# Patient Record
Sex: Male | Born: 1935 | Race: White | Hispanic: No | Marital: Married | Smoking: Former smoker
Health system: Southern US, Community
[De-identification: ages and names within clinical notes are randomized; demographics above are authoritative.]

## PROBLEM LIST (undated history)

## (undated) DIAGNOSIS — Q2730 Arteriovenous malformation, site unspecified: Secondary | ICD-10-CM

## (undated) DIAGNOSIS — I2510 Atherosclerotic heart disease of native coronary artery without angina pectoris: Secondary | ICD-10-CM

## (undated) DIAGNOSIS — I359 Nonrheumatic aortic valve disorder, unspecified: Secondary | ICD-10-CM

## (undated) DIAGNOSIS — G309 Alzheimer's disease, unspecified: Secondary | ICD-10-CM

## (undated) DIAGNOSIS — E785 Hyperlipidemia, unspecified: Secondary | ICD-10-CM

## (undated) DIAGNOSIS — D649 Anemia, unspecified: Secondary | ICD-10-CM

## (undated) DIAGNOSIS — M069 Rheumatoid arthritis, unspecified: Secondary | ICD-10-CM

## (undated) DIAGNOSIS — F0280 Dementia in other diseases classified elsewhere without behavioral disturbance: Secondary | ICD-10-CM

## (undated) DIAGNOSIS — I82409 Acute embolism and thrombosis of unspecified deep veins of unspecified lower extremity: Secondary | ICD-10-CM

## (undated) HISTORY — PX: APPENDECTOMY: SHX54

## (undated) HISTORY — PX: TONSILLECTOMY: SUR1361

## (undated) HISTORY — PX: TRIGGER FINGER RELEASE: SHX641

## (undated) HISTORY — DX: Nonrheumatic aortic valve disorder, unspecified: I35.9

## (undated) HISTORY — DX: Anemia, unspecified: D64.9

## (undated) HISTORY — PX: LEG SURGERY: SHX1003

## (undated) HISTORY — PX: EYE SURGERY: SHX253

## (undated) HISTORY — DX: Hyperlipidemia, unspecified: E78.5

## (undated) HISTORY — DX: Acute embolism and thrombosis of unspecified deep veins of unspecified lower extremity: I82.409

---

## 2007-04-06 DIAGNOSIS — I2510 Atherosclerotic heart disease of native coronary artery without angina pectoris: Secondary | ICD-10-CM

## 2007-04-06 DIAGNOSIS — I359 Nonrheumatic aortic valve disorder, unspecified: Secondary | ICD-10-CM

## 2007-04-06 HISTORY — PX: AORTIC VALVE REPLACEMENT: SHX41

## 2007-04-06 HISTORY — PX: CARDIAC VALVE REPLACEMENT: SHX585

## 2007-04-06 HISTORY — PX: CORONARY ARTERY BYPASS GRAFT: SHX141

## 2007-04-06 HISTORY — DX: Atherosclerotic heart disease of native coronary artery without angina pectoris: I25.10

## 2007-04-06 HISTORY — DX: Nonrheumatic aortic valve disorder, unspecified: I35.9

## 2010-09-06 ENCOUNTER — Emergency Department (HOSPITAL_COMMUNITY)
Admission: EM | Admit: 2010-09-06 | Discharge: 2010-09-07 | Disposition: A | Discharge status: Home / Self Care | Payer: Medicare Other | Attending: Emergency Medicine | Admitting: Emergency Medicine

## 2010-09-06 ENCOUNTER — Emergency Department (HOSPITAL_COMMUNITY): Payer: Medicare Other

## 2010-09-06 DIAGNOSIS — I10 Essential (primary) hypertension: Secondary | ICD-10-CM | POA: Insufficient documentation

## 2010-09-06 DIAGNOSIS — I252 Old myocardial infarction: Secondary | ICD-10-CM | POA: Insufficient documentation

## 2010-09-06 DIAGNOSIS — Z7902 Long term (current) use of antithrombotics/antiplatelets: Secondary | ICD-10-CM | POA: Insufficient documentation

## 2010-09-06 DIAGNOSIS — Z7982 Long term (current) use of aspirin: Secondary | ICD-10-CM | POA: Insufficient documentation

## 2010-09-06 DIAGNOSIS — Z951 Presence of aortocoronary bypass graft: Secondary | ICD-10-CM | POA: Insufficient documentation

## 2010-09-06 DIAGNOSIS — I2510 Atherosclerotic heart disease of native coronary artery without angina pectoris: Secondary | ICD-10-CM | POA: Insufficient documentation

## 2010-09-06 DIAGNOSIS — J189 Pneumonia, unspecified organism: Secondary | ICD-10-CM | POA: Insufficient documentation

## 2010-09-06 DIAGNOSIS — R509 Fever, unspecified: Secondary | ICD-10-CM | POA: Insufficient documentation

## 2010-09-07 LAB — CBC
HCT: 39.3 % (ref 39.0–52.0)
Hemoglobin: 13.2 g/dL (ref 13.0–17.0)
MCH: 31.1 pg (ref 26.0–34.0)
MCHC: 33.6 g/dL (ref 30.0–36.0)
RDW: 13.6 % (ref 11.5–15.5)

## 2010-09-07 LAB — DIFFERENTIAL
Eosinophils Relative: 3 % (ref 0–5)
Lymphocytes Relative: 3 % — ABNORMAL LOW (ref 12–46)
Monocytes Absolute: 0.4 10*3/uL (ref 0.1–1.0)
Monocytes Relative: 3 % (ref 3–12)
Neutro Abs: 10.5 10*3/uL — ABNORMAL HIGH (ref 1.7–7.7)

## 2010-09-07 LAB — BASIC METABOLIC PANEL
BUN: 31 mg/dL — ABNORMAL HIGH (ref 6–23)
CO2: 25 mEq/L (ref 19–32)
Calcium: 10 mg/dL (ref 8.4–10.5)
Creatinine, Ser: 1.68 mg/dL — ABNORMAL HIGH (ref 0.4–1.5)
Glucose, Bld: 138 mg/dL — ABNORMAL HIGH (ref 70–99)

## 2013-04-05 HISTORY — PX: COLONOSCOPY: SHX174

## 2014-04-05 HISTORY — PX: UPPER GASTROINTESTINAL ENDOSCOPY: SHX188

## 2015-04-06 DIAGNOSIS — I82409 Acute embolism and thrombosis of unspecified deep veins of unspecified lower extremity: Secondary | ICD-10-CM

## 2015-04-06 HISTORY — DX: Acute embolism and thrombosis of unspecified deep veins of unspecified lower extremity: I82.409

## 2015-06-23 ENCOUNTER — Telehealth (INDEPENDENT_AMBULATORY_CARE_PROVIDER_SITE_OTHER): Payer: Self-pay | Admitting: Internal Medicine

## 2015-06-23 NOTE — Telephone Encounter (Signed)
Called patient and he said he cannot come tomorrow.  He said leave appt as is for now

## 2015-06-23 NOTE — Telephone Encounter (Signed)
Once referral comes in give to Tammy and ask her to talk with me.  She can ask Dr. Karilyn Cota if he thinks this needs to be this week.  I will have to be tomorrow as Wednesday we have inspection 3-4:30 (time changed with Rinaldo Cloud) and Thursday already has 2.

## 2015-06-23 NOTE — Telephone Encounter (Signed)
Dr. Hart Robinsons from [place], [place] called saying he wants Dr. Karilyn Cota to see Mr. Cervenka this week if possible. Mr. Davoli isn't symptomatic but within a six month time frame his HNH(sp?) levels went from 12 to 8.1. We offered Mr. Fancher to see Terri tomorrow but Dr. Hart Robinsons only wants him to see Dr. Karilyn Cota. Please let them know if this will be ok. They're supposed to fax a referral to [place] today.  Dr. Hart Robinsons' ph# [phone]  / Fax# [phone] Thank you.

## 2015-06-23 NOTE — Telephone Encounter (Signed)
Noted  

## 2015-06-24 NOTE — Telephone Encounter (Signed)
Per Dr.Rehman. Call office and have them to send the lab work. Per Dr.Rehman bring the patient in on Thursday.

## 2015-06-24 NOTE — Telephone Encounter (Signed)
Patient offered an appt for Thursday but he declined due to Rheumatology appt

## 2015-06-24 NOTE — Telephone Encounter (Signed)
Forwarded to [facility] to schedule OV, trying to obtain labs and notes

## 2015-06-24 NOTE — Telephone Encounter (Signed)
Will try to schedule patient this Thursday. Please obtain patient's recent bloodwork.

## 2015-06-25 NOTE — Telephone Encounter (Signed)
Patient's daughter called the OV back yesterday . She states that her father will be here on Thursday ,March 23,2017.

## 2015-06-26 ENCOUNTER — Encounter (INDEPENDENT_AMBULATORY_CARE_PROVIDER_SITE_OTHER): Payer: Self-pay | Admitting: Internal Medicine

## 2015-06-26 ENCOUNTER — Other Ambulatory Visit (INDEPENDENT_AMBULATORY_CARE_PROVIDER_SITE_OTHER): Payer: Self-pay | Admitting: Internal Medicine

## 2015-06-26 ENCOUNTER — Encounter (INDEPENDENT_AMBULATORY_CARE_PROVIDER_SITE_OTHER): Payer: Self-pay | Admitting: *Deleted

## 2015-06-26 ENCOUNTER — Ambulatory Visit (INDEPENDENT_AMBULATORY_CARE_PROVIDER_SITE_OTHER): Payer: Medicare Other | Admitting: Internal Medicine

## 2015-06-26 VITALS — BP 108/64 | HR 71 | Temp 97.1°F | Resp 18 | Ht 67.0 in | Wt 183.1 lb

## 2015-06-26 DIAGNOSIS — D509 Iron deficiency anemia, unspecified: Secondary | ICD-10-CM

## 2015-06-26 DIAGNOSIS — R195 Other fecal abnormalities: Secondary | ICD-10-CM | POA: Diagnosis not present

## 2015-06-26 DIAGNOSIS — K219 Gastro-esophageal reflux disease without esophagitis: Secondary | ICD-10-CM | POA: Insufficient documentation

## 2015-06-26 DIAGNOSIS — I10 Essential (primary) hypertension: Secondary | ICD-10-CM | POA: Insufficient documentation

## 2015-06-26 DIAGNOSIS — Z8719 Personal history of other diseases of the digestive system: Secondary | ICD-10-CM | POA: Diagnosis not present

## 2015-06-26 DIAGNOSIS — Z8711 Personal history of peptic ulcer disease: Secondary | ICD-10-CM | POA: Insufficient documentation

## 2015-06-26 DIAGNOSIS — K922 Gastrointestinal hemorrhage, unspecified: Secondary | ICD-10-CM

## 2015-06-26 DIAGNOSIS — Z951 Presence of aortocoronary bypass graft: Secondary | ICD-10-CM | POA: Insufficient documentation

## 2015-06-26 DIAGNOSIS — Z952 Presence of prosthetic heart valve: Secondary | ICD-10-CM | POA: Insufficient documentation

## 2015-06-26 NOTE — Patient Instructions (Signed)
Esophagogastroduodenoscopy to be scheduled. Blood work to be done prior to seizure tomorrow. Hemoccults 1
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
## 2015-06-27 ENCOUNTER — Observation Stay (HOSPITAL_COMMUNITY)
Admission: RE | Admit: 2015-06-27 | Discharge: 2015-06-28 | Disposition: A | Discharge status: Home / Self Care | Payer: Medicare Other | Source: Ambulatory Visit | Attending: Family Medicine | Admitting: Family Medicine

## 2015-06-27 ENCOUNTER — Encounter (HOSPITAL_COMMUNITY): Payer: Self-pay | Admitting: *Deleted

## 2015-06-27 ENCOUNTER — Encounter (HOSPITAL_COMMUNITY)
Admission: RE | Disposition: A | Discharge status: Home / Self Care | Payer: Self-pay | Source: Ambulatory Visit | Attending: Family Medicine

## 2015-06-27 DIAGNOSIS — Z79899 Other long term (current) drug therapy: Secondary | ICD-10-CM | POA: Diagnosis not present

## 2015-06-27 DIAGNOSIS — K3189 Other diseases of stomach and duodenum: Secondary | ICD-10-CM

## 2015-06-27 DIAGNOSIS — Z952 Presence of prosthetic heart valve: Secondary | ICD-10-CM | POA: Insufficient documentation

## 2015-06-27 DIAGNOSIS — D509 Iron deficiency anemia, unspecified: Secondary | ICD-10-CM

## 2015-06-27 DIAGNOSIS — I2510 Atherosclerotic heart disease of native coronary artery without angina pectoris: Secondary | ICD-10-CM | POA: Diagnosis not present

## 2015-06-27 DIAGNOSIS — Z8711 Personal history of peptic ulcer disease: Secondary | ICD-10-CM | POA: Insufficient documentation

## 2015-06-27 DIAGNOSIS — E785 Hyperlipidemia, unspecified: Secondary | ICD-10-CM | POA: Diagnosis not present

## 2015-06-27 DIAGNOSIS — K5521 Angiodysplasia of colon with hemorrhage: Secondary | ICD-10-CM | POA: Insufficient documentation

## 2015-06-27 DIAGNOSIS — Z7982 Long term (current) use of aspirin: Secondary | ICD-10-CM | POA: Diagnosis not present

## 2015-06-27 DIAGNOSIS — K922 Gastrointestinal hemorrhage, unspecified: Secondary | ICD-10-CM

## 2015-06-27 DIAGNOSIS — K449 Diaphragmatic hernia without obstruction or gangrene: Secondary | ICD-10-CM

## 2015-06-27 DIAGNOSIS — M069 Rheumatoid arthritis, unspecified: Secondary | ICD-10-CM | POA: Diagnosis not present

## 2015-06-27 DIAGNOSIS — Z7902 Long term (current) use of antithrombotics/antiplatelets: Secondary | ICD-10-CM | POA: Diagnosis not present

## 2015-06-27 DIAGNOSIS — D500 Iron deficiency anemia secondary to blood loss (chronic): Principal | ICD-10-CM | POA: Insufficient documentation

## 2015-06-27 DIAGNOSIS — K219 Gastro-esophageal reflux disease without esophagitis: Secondary | ICD-10-CM | POA: Insufficient documentation

## 2015-06-27 DIAGNOSIS — Z87891 Personal history of nicotine dependence: Secondary | ICD-10-CM | POA: Diagnosis not present

## 2015-06-27 DIAGNOSIS — K31819 Angiodysplasia of stomach and duodenum without bleeding: Secondary | ICD-10-CM

## 2015-06-27 HISTORY — DX: Atherosclerotic heart disease of native coronary artery without angina pectoris: I25.10

## 2015-06-27 HISTORY — PX: ESOPHAGOGASTRODUODENOSCOPY: SHX5428

## 2015-06-27 LAB — ABO/RH: ABO/RH(D): A POS

## 2015-06-27 LAB — HEMOGLOBIN AND HEMATOCRIT, BLOOD
HCT: 22.5 % — ABNORMAL LOW (ref 39.0–52.0)
HEMOGLOBIN: 6.7 g/dL — AB (ref 13.0–17.0)

## 2015-06-27 LAB — IRON AND TIBC
Iron: 10 ug/dL — ABNORMAL LOW (ref 45–182)
Saturation Ratios: 2 % — ABNORMAL LOW (ref 17.9–39.5)
TIBC: 445 ug/dL (ref 250–450)
UIBC: 435 ug/dL

## 2015-06-27 LAB — PREPARE RBC (CROSSMATCH)

## 2015-06-27 LAB — FERRITIN: FERRITIN: 10 ng/mL — AB (ref 24–336)

## 2015-06-27 SURGERY — EGD (ESOPHAGOGASTRODUODENOSCOPY)
Anesthesia: Moderate Sedation

## 2015-06-27 MED ORDER — MEPERIDINE HCL 50 MG/ML IJ SOLN
INTRAMUSCULAR | Status: DC | PRN
Start: 2015-06-27 — End: 2015-06-27
  Administered 2015-06-27 (×2): 25 mg via INTRAVENOUS

## 2015-06-27 MED ORDER — ACETAMINOPHEN 325 MG PO TABS
650.0000 mg | ORAL_TABLET | Freq: Once | ORAL | Status: AC
  Administered 2015-06-27: 650 mg via ORAL
  Filled 2015-06-27: qty 2

## 2015-06-27 MED ORDER — MIDAZOLAM HCL 5 MG/5ML IJ SOLN
INTRAMUSCULAR | Status: DC | PRN
  Administered 2015-06-27 (×2): 1 mg via INTRAVENOUS

## 2015-06-27 MED ORDER — MIDAZOLAM HCL 5 MG/5ML IJ SOLN
INTRAMUSCULAR | Status: AC
Start: 2015-06-27 — End: 2015-06-28
  Filled 2015-06-27: qty 10

## 2015-06-27 MED ORDER — BUTAMBEN-TETRACAINE-BENZOCAINE 2-2-14 % EX AERO
INHALATION_SPRAY | CUTANEOUS | Status: DC | PRN
  Administered 2015-06-27: 2 via TOPICAL

## 2015-06-27 MED ORDER — DIPHENHYDRAMINE HCL 25 MG PO CAPS
25.0000 mg | ORAL_CAPSULE | Freq: Once | ORAL | Status: AC
  Administered 2015-06-27: 25 mg via ORAL
  Filled 2015-06-27: qty 1

## 2015-06-27 MED ORDER — MEPERIDINE HCL 50 MG/ML IJ SOLN
INTRAMUSCULAR | Status: AC
  Filled 2015-06-27: qty 1

## 2015-06-27 MED ORDER — STERILE WATER FOR IRRIGATION IR SOLN
Status: DC | PRN
  Administered 2015-06-27: 15:00:00

## 2015-06-27 MED ORDER — FUROSEMIDE 10 MG/ML IJ SOLN
20.0000 mg | Freq: Once | INTRAMUSCULAR | Status: AC
  Administered 2015-06-27: 20 mg via INTRAVENOUS
  Filled 2015-06-27: qty 2

## 2015-06-27 MED ORDER — SODIUM CHLORIDE 0.9 % IV SOLN
Freq: Once | INTRAVENOUS | Status: AC
  Administered 2015-06-27: 10 mL/h via INTRAVENOUS

## 2015-06-27 MED ORDER — SODIUM CHLORIDE 0.9 % IV SOLN
INTRAVENOUS | Status: DC
  Administered 2015-06-27: 1000 mL via INTRAVENOUS

## 2015-06-27 MED ORDER — PREDNISONE 10 MG PO TABS
5.0000 mg | ORAL_TABLET | Freq: Once | ORAL | Status: AC
  Administered 2015-06-27: 5 mg via ORAL
  Filled 2015-06-27: qty 1

## 2015-06-27 NOTE — Op Note (Signed)
[facility] Patient Name: Leon Freeman Procedure Date: 06/27/2015 2:49 PM MRN: [id number] Date of Birth: 06-04-1931 Attending MD: Lionel December , MD CSN: [id number] Age: 82 Admit Type: Outpatient Procedure:                Upper GI endoscopy Indications:              Iron deficiency anemia secondary to chronic blood                            loss Providers:                Lionel December, MD, Cynda Acres, RN, Birder Robson,                            Technician Referring MD:             Renaldo Harrison D.O Medicines:                Cetacaine spray, Meperidine 50 mg IV, Midazolam 2                            mg IV Complications:            No immediate complications. Estimated Blood Loss:     Estimated blood loss was minimal. Procedure:                Pre-Anesthesia Assessment:                           - Prior to the procedure, a History and Physical                            was performed, and patient medications and                            allergies were reviewed. The patient's tolerance of                            previous anesthesia was also reviewed. The risks                            and benefits of the procedure and the sedation                            options and risks were discussed with the patient.                            All questions were answered, and informed consent                            was obtained. Prior Anticoagulants: The patient                            last took aspirin 1 day and Plavix (clopidogrel) 1  day prior to the procedure. ASA Grade Assessment:                            II - A patient with mild systemic disease. After                            reviewing the risks and benefits, the patient was                            deemed in satisfactory condition to undergo the                            procedure.                           After obtaining informed consent, the endoscope was    passed under direct vision. Throughout the                            procedure, the patient's blood pressure, pulse, and                            oxygen saturations were monitored continuously. The                            [id number] ([id number]) scope was introduced through the                            mouth, and advanced to the second part of duodenum.                            The upper GI endoscopy was accomplished without                            difficulty. The patient tolerated the procedure                            well. Scope In: 3:05:56 PM Scope Out: 3:25:52 PM Total Procedure Duration: 0 hours 19 minutes 56 seconds  Findings:      The examined esophagus was normal.      A small hiatal hernia was present.      A healed ulcer was found in the gastric antrum. The scar tissue was       healthy in appearance.      The exam was otherwise without abnormality.      A single 4 mm angiodysplastic lesion with bleeding on contact was found       in the second portion of the duodenum. To stop active bleeding, one       hemostatic clip was successfully placed (MR conditional). There was no       bleeding at the end of the procedure. Impression:               - Normal esophagus.                           -  Small hiatal hernia.                           - Scar in the gastric antrum.                           - The examination was otherwise normal.                           - A single angiodysplastic lesion in the duodenum.                            Clip (MR conditional) was placed.                           - No specimens collected. Moderate Sedation:      Moderate (conscious) sedation was administered by the endoscopy nurse       and supervised by the endoscopist. The following parameters were       monitored: oxygen saturation, heart rate, blood pressure, CO2       capnography and response to care. Total physician intraservice time was       26 minutes. Recommendation:            - Admit the patient to hospital ward for possible                            discharge same day.                           - transfuse 2 units of PRBCs                           - Resume previous diet.                           - Continue present medications. Procedure Code(s):        --- Professional ---                           [phone], Esophagogastroduodenoscopy, flexible,                            transoral; with control of bleeding, any method                           [id number], Moderate sedation services provided by the                            same physician or other qualified health care                            professional performing the diagnostic or                            therapeutic service that the sedation supports,  requiring the presence of an independent trained                            observer to assist in the monitoring of the                            patient's level of consciousness and physiological                            status; initial 15 minutes of intraservice time,                            patient age 43 years or older                           [phone], Moderate sedation services; each additional                            15 minutes intraservice time Diagnosis Code(s):        --- Professional ---                           K44.9, Diaphragmatic hernia without obstruction or                            gangrene                           K31.89, Other diseases of stomach and duodenum                           K31.819, Angiodysplasia of stomach and duodenum                            without bleeding                           D50.0, Iron deficiency anemia secondary to blood                            loss (chronic) CPT copyright 2016 American Medical Association. All rights reserved. The codes documented in this report are preliminary and upon coder review may  be revised to meet current compliance requirements. Lionel December, MD Lionel December, MD 06/27/2015 4:12:10 PM This report has been signed electronically. Number of Addenda: 0

## 2015-06-27 NOTE — Progress Notes (Signed)
Patient admitted to observation for blood transfusion to room 305.  Report called to Johnsie Cancel, RN.  Patient transported to Room 305 via wheelchair by Edrick Kins, RN

## 2015-06-27 NOTE — Progress Notes (Signed)
Patient underwent EGD and noted to have angiodysplasia at angle of duodenum with spontaneous bleeding during examination. The clip placed. Hemoglobin is 6.7. Hemoglobin was 8.1 on 06/19/2015 Patient will be admitted as an observation to receive 2 units of PRBCs.

## 2015-06-27 NOTE — H&P (Signed)
Leon Freeman is an 82 y.o. male.   Chief Complaint: Patient is here for esophagogastroduodenoscopy. HPI: Patient is 82 year old Caucasian male who was recently found to have hemoglobin of 8.1 g on routine testing stone is quite positive. There is no history of melena or rectal bleeding or hematemesis. Patient has history of gastric ulcer 3 years ago secondary to NSAID therapy. He is on low-dose aspirin and Plavix which are non-hold. He had normal colonoscopy about 2 years ago. He is undergoing diagnostic EGD. If EGD is negative proceed with small bowel given capsule study.  Past Medical History  Diagnosis Date  . Anemia   . Coronary artery disease     Past Surgical History  Procedure Laterality Date  . Cardiac valve surgery  2009  . Trigger finger release Left   . Leg surgery    . Colonoscopy  2015    Dr.Spainhour  . Upper gastrointestinal endoscopy  2016    Dr.Spainhour  . Appendectomy    . Eye surgery Left   . Coronary artery bypass graft      Family History  Problem Relation Age of Onset  . Dementia Mother   . Heart disease Father   . Healthy Daughter   . Bipolar disorder Son    Social History:  reports that he quit smoking about 40 years ago. He has never used smokeless tobacco. He reports that he does not drink alcohol. His drug history is not on file.  Allergies:  Allergies  Allergen Reactions  . Penicillins Itching  . Plaquenil [Hydroxychloroquine Sulfate]     Medications Prior to Admission  Medication Sig Dispense Refill  . clopidogrel (PLAVIX) 75 MG tablet Take 75 mg by mouth daily.    . metoprolol succinate (TOPROL-XL) 50 MG 24 hr tablet Take 50 mg by mouth daily. Take with or immediately following a meal.    . aspirin EC 81 MG tablet Take 81 mg by mouth daily.    Marland Kitchen atorvastatin (LIPITOR) 80 MG tablet Take 80 mg by mouth daily.    . calcium gluconate 500 MG tablet Take 1 tablet by mouth daily.    . Cholecalciferol (VITAMIN D) 2000 units CAPS Take by mouth  daily.    . clindamycin (CLEOCIN) 300 MG capsule Take 300 mg by mouth. Patient will takes 2 by mouth prior to dental treatment.    . Glucosamine-Chondroit-Vit C-Mn (GLUCOSAMINE 1500 COMPLEX) CAPS Take by mouth daily.    . Lactobacillus (BIOTINEX PO) Take 10,000 mcg by mouth daily.    Marland Kitchen METRONIDAZOLE, TOPICAL, (METROLOTION) 0.75 % LOTN Apply topically daily.    Marland Kitchen omeprazole (PRILOSEC) 40 MG capsule Take 40 mg by mouth daily.    . predniSONE (DELTASONE) 5 MG tablet Take 5 mg by mouth daily with breakfast.    . vitamin B-12 (CYANOCOBALAMIN) 1000 MCG tablet Take 1,000 mcg by mouth daily.    . vitamin C (ASCORBIC ACID) 500 MG tablet Take 500 mg by mouth daily.    Marland Kitchen VITAMINS-LIPOTROPICS PO Take by mouth 2 (two) times daily.    . Zinc 25 MG TABS Take by mouth daily.      No results found for this or any previous visit (from the past 48 hour(s)). No results found.  ROS  Blood pressure 115/77, pulse 99, temperature 98 F (36.7 C), temperature source Oral, resp. rate 21, height 5\' 7"  (1.702 m), weight 180 lb (81.647 kg), SpO2 92 %. Physical Exam  Constitutional: He appears well-developed and well-nourished.  HENT:  Mouth/Throat: Oropharynx  is clear and moist.  Eyes: Conjunctivae are normal. No scleral icterus.  Neck: No thyromegaly present.  Cardiovascular:  Regular rhythm normal S1 and loud S2. Grade 3/6 systolic ejection murmur heard all over the precordium but best heard at left sternal border.  GI: Soft. He exhibits no distension and no mass. There is no tenderness.  Musculoskeletal: He exhibits no edema.  Lymphadenopathy:    He has no cervical adenopathy.  Neurological: He is alert.  Skin: Skin is warm and dry.     Assessment/Plan Microcytic anemia and heme positive stool. Iron studies are pending. Diagnostic EGD to be followed by given capsule study if EGD is normal.  Malissa Hippo, MD 06/27/2015, 2:53 PM

## 2015-06-28 DIAGNOSIS — R0902 Hypoxemia: Secondary | ICD-10-CM | POA: Diagnosis present

## 2015-06-28 DIAGNOSIS — Z8249 Family history of ischemic heart disease and other diseases of the circulatory system: Secondary | ICD-10-CM

## 2015-06-28 DIAGNOSIS — I248 Other forms of acute ischemic heart disease: Secondary | ICD-10-CM | POA: Diagnosis present

## 2015-06-28 DIAGNOSIS — D62 Acute posthemorrhagic anemia: Secondary | ICD-10-CM | POA: Diagnosis present

## 2015-06-28 DIAGNOSIS — K31819 Angiodysplasia of stomach and duodenum without bleeding: Secondary | ICD-10-CM | POA: Diagnosis present

## 2015-06-28 DIAGNOSIS — I82491 Acute embolism and thrombosis of other specified deep vein of right lower extremity: Secondary | ICD-10-CM | POA: Diagnosis present

## 2015-06-28 DIAGNOSIS — Z7982 Long term (current) use of aspirin: Secondary | ICD-10-CM

## 2015-06-28 DIAGNOSIS — R55 Syncope and collapse: Principal | ICD-10-CM | POA: Diagnosis present

## 2015-06-28 DIAGNOSIS — K449 Diaphragmatic hernia without obstruction or gangrene: Secondary | ICD-10-CM | POA: Diagnosis present

## 2015-06-28 DIAGNOSIS — G47419 Narcolepsy without cataplexy: Secondary | ICD-10-CM | POA: Diagnosis present

## 2015-06-28 DIAGNOSIS — D509 Iron deficiency anemia, unspecified: Secondary | ICD-10-CM | POA: Diagnosis present

## 2015-06-28 DIAGNOSIS — T447X5A Adverse effect of beta-adrenoreceptor antagonists, initial encounter: Secondary | ICD-10-CM | POA: Diagnosis present

## 2015-06-28 DIAGNOSIS — Z87891 Personal history of nicotine dependence: Secondary | ICD-10-CM

## 2015-06-28 DIAGNOSIS — I5023 Acute on chronic systolic (congestive) heart failure: Secondary | ICD-10-CM | POA: Diagnosis present

## 2015-06-28 DIAGNOSIS — Z951 Presence of aortocoronary bypass graft: Secondary | ICD-10-CM

## 2015-06-28 DIAGNOSIS — I110 Hypertensive heart disease with heart failure: Secondary | ICD-10-CM | POA: Diagnosis present

## 2015-06-28 DIAGNOSIS — I2510 Atherosclerotic heart disease of native coronary artery without angina pectoris: Secondary | ICD-10-CM | POA: Diagnosis present

## 2015-06-28 DIAGNOSIS — M069 Rheumatoid arthritis, unspecified: Secondary | ICD-10-CM | POA: Diagnosis present

## 2015-06-28 LAB — TYPE AND SCREEN
ABO/RH(D): A POS
Antibody Screen: NEGATIVE
UNIT DIVISION: 0
Unit division: 0

## 2015-06-28 LAB — HEMOGLOBIN AND HEMATOCRIT, BLOOD
HCT: 29.5 % — ABNORMAL LOW (ref 39.0–52.0)
HEMOGLOBIN: 9.3 g/dL — AB (ref 13.0–17.0)

## 2015-06-28 NOTE — Discharge Instructions (Signed)
Call if you have melena or rectal bleeding. Repeat EGD next week . Office will call.

## 2015-06-28 NOTE — Progress Notes (Signed)
Late entry for approximately 0630, patients discharge instructions were discussed, he and his wife verbalized understanding. His IV was removed and he was wheeled down to the from entrance via wheel chair in NAD.

## 2015-06-30 ENCOUNTER — Inpatient Hospital Stay (HOSPITAL_COMMUNITY)
Admission: EM | Admit: 2015-06-30 | Discharge: 2015-07-06 | DRG: 312 | Disposition: A | Discharge status: Home Health | Payer: Medicare Other | Attending: Internal Medicine | Admitting: Internal Medicine

## 2015-06-30 ENCOUNTER — Telehealth (INDEPENDENT_AMBULATORY_CARE_PROVIDER_SITE_OTHER): Payer: Self-pay | Admitting: *Deleted

## 2015-06-30 ENCOUNTER — Inpatient Hospital Stay (HOSPITAL_COMMUNITY): Payer: Medicare Other

## 2015-06-30 ENCOUNTER — Emergency Department (HOSPITAL_COMMUNITY): Payer: Medicare Other

## 2015-06-30 ENCOUNTER — Observation Stay (HOSPITAL_COMMUNITY): Payer: Medicare Other

## 2015-06-30 ENCOUNTER — Encounter (HOSPITAL_COMMUNITY): Payer: Self-pay | Admitting: Emergency Medicine

## 2015-06-30 DIAGNOSIS — R778 Other specified abnormalities of plasma proteins: Secondary | ICD-10-CM | POA: Diagnosis present

## 2015-06-30 DIAGNOSIS — Z952 Presence of prosthetic heart valve: Secondary | ICD-10-CM

## 2015-06-30 DIAGNOSIS — M069 Rheumatoid arthritis, unspecified: Secondary | ICD-10-CM | POA: Diagnosis present

## 2015-06-30 DIAGNOSIS — D649 Anemia, unspecified: Secondary | ICD-10-CM

## 2015-06-30 DIAGNOSIS — I248 Other forms of acute ischemic heart disease: Secondary | ICD-10-CM | POA: Diagnosis not present

## 2015-06-30 DIAGNOSIS — Z954 Presence of other heart-valve replacement: Secondary | ICD-10-CM

## 2015-06-30 DIAGNOSIS — R55 Syncope and collapse: Secondary | ICD-10-CM | POA: Diagnosis not present

## 2015-06-30 DIAGNOSIS — G4710 Hypersomnia, unspecified: Secondary | ICD-10-CM

## 2015-06-30 DIAGNOSIS — R7989 Other specified abnormal findings of blood chemistry: Secondary | ICD-10-CM

## 2015-06-30 DIAGNOSIS — D62 Acute posthemorrhagic anemia: Secondary | ICD-10-CM | POA: Diagnosis not present

## 2015-06-30 DIAGNOSIS — I6523 Occlusion and stenosis of bilateral carotid arteries: Secondary | ICD-10-CM

## 2015-06-30 DIAGNOSIS — K922 Gastrointestinal hemorrhage, unspecified: Secondary | ICD-10-CM | POA: Diagnosis present

## 2015-06-30 DIAGNOSIS — Z9889 Other specified postprocedural states: Secondary | ICD-10-CM

## 2015-06-30 DIAGNOSIS — I10 Essential (primary) hypertension: Secondary | ICD-10-CM

## 2015-06-30 DIAGNOSIS — Z951 Presence of aortocoronary bypass graft: Secondary | ICD-10-CM

## 2015-06-30 DIAGNOSIS — W19XXXA Unspecified fall, initial encounter: Secondary | ICD-10-CM | POA: Insufficient documentation

## 2015-06-30 DIAGNOSIS — I82401 Acute embolism and thrombosis of unspecified deep veins of right lower extremity: Secondary | ICD-10-CM

## 2015-06-30 DIAGNOSIS — I509 Heart failure, unspecified: Secondary | ICD-10-CM

## 2015-06-30 LAB — BASIC METABOLIC PANEL
Anion gap: 9 (ref 5–15)
BUN: 33 mg/dL — AB (ref 6–20)
CALCIUM: 8.8 mg/dL — AB (ref 8.9–10.3)
CO2: 21 mmol/L — ABNORMAL LOW (ref 22–32)
CREATININE: 1.4 mg/dL — AB (ref 0.61–1.24)
Chloride: 104 mmol/L (ref 101–111)
GFR calc Af Amer: 52 mL/min — ABNORMAL LOW (ref >60)
GFR, EST NON AFRICAN AMERICAN: 45 mL/min — AB (ref >60)
GLUCOSE: 132 mg/dL — AB (ref 65–99)
Potassium: 4.3 mmol/L (ref 3.5–5.1)
Sodium: 134 mmol/L — ABNORMAL LOW (ref 135–145)

## 2015-06-30 LAB — CBC
HCT: 29 % — ABNORMAL LOW (ref 39.0–52.0)
Hemoglobin: 9 g/dL — ABNORMAL LOW (ref 13.0–17.0)
MCH: 23.7 pg — ABNORMAL LOW (ref 26.0–34.0)
MCHC: 31 g/dL (ref 30.0–36.0)
MCV: 76.5 fL — ABNORMAL LOW (ref 78.0–100.0)
Platelets: 284 10*3/uL (ref 150–400)
RBC: 3.79 MIL/uL — ABNORMAL LOW (ref 4.22–5.81)
RDW: 16.9 % — AB (ref 11.5–15.5)
WBC: 9.8 10*3/uL (ref 4.0–10.5)

## 2015-06-30 LAB — URINALYSIS, ROUTINE W REFLEX MICROSCOPIC
BILIRUBIN URINE: NEGATIVE
Glucose, UA: NEGATIVE mg/dL
HGB URINE DIPSTICK: NEGATIVE
KETONES UR: NEGATIVE mg/dL
Leukocytes, UA: NEGATIVE
NITRITE: NEGATIVE
PH: 5 (ref 5.0–8.0)
Protein, ur: NEGATIVE mg/dL
Specific Gravity, Urine: 1.025 (ref 1.005–1.030)

## 2015-06-30 LAB — PROTIME-INR
INR: 1.22 (ref 0.00–1.49)
Prothrombin Time: 15.6 seconds — ABNORMAL HIGH (ref 11.6–15.2)

## 2015-06-30 LAB — TYPE AND SCREEN
ABO/RH(D): A POS
ANTIBODY SCREEN: NEGATIVE

## 2015-06-30 LAB — TROPONIN I: TROPONIN I: 0.33 ng/mL — AB (ref <0.031)

## 2015-06-30 LAB — POC OCCULT BLOOD, ED: Fecal Occult Bld: NEGATIVE

## 2015-06-30 MED ORDER — VITAMIN C 500 MG PO TABS
500.0000 mg | ORAL_TABLET | Freq: Every day | ORAL | Status: DC
  Administered 2015-07-01 – 2015-07-06 (×6): 500 mg via ORAL
  Filled 2015-06-30 (×6): qty 1

## 2015-06-30 MED ORDER — PANTOPRAZOLE SODIUM 40 MG PO TBEC
40.0000 mg | DELAYED_RELEASE_TABLET | Freq: Every day | ORAL | Status: DC
  Administered 2015-07-01 – 2015-07-06 (×6): 40 mg via ORAL
  Filled 2015-06-30 (×6): qty 1

## 2015-06-30 MED ORDER — CALCIUM GLUCONATE 500 MG PO TABS
1.0000 | ORAL_TABLET | Freq: Every day | ORAL | Status: DC
  Filled 2015-06-30 (×3): qty 1

## 2015-06-30 MED ORDER — TECHNETIUM TO 99M ALBUMIN AGGREGATED
4.0000 | Freq: Once | INTRAVENOUS | Status: AC | PRN
  Administered 2015-06-30: 4.4 via INTRAVENOUS

## 2015-06-30 MED ORDER — ASPIRIN 81 MG PO CHEW
324.0000 mg | CHEWABLE_TABLET | Freq: Once | ORAL | Status: AC
  Administered 2015-06-30: 324 mg via ORAL
  Filled 2015-06-30: qty 4

## 2015-06-30 MED ORDER — GLUCOSAMINE 1500 COMPLEX PO CAPS: 1.0000 | ORAL_CAPSULE | Freq: Every day | ORAL | Status: DC

## 2015-06-30 MED ORDER — ACETAMINOPHEN 650 MG RE SUPP
650.0000 mg | Freq: Four times a day (QID) | RECTAL | Status: DC | PRN
Start: 2015-06-30 — End: 2015-07-06

## 2015-06-30 MED ORDER — HEPARIN (PORCINE) IN NACL 100-0.45 UNIT/ML-% IJ SOLN
1250.0000 [IU]/h | INTRAMUSCULAR | Status: DC
  Administered 2015-06-30 – 2015-07-02 (×2): 1100 [IU]/h via INTRAVENOUS
  Filled 2015-06-30 (×4): qty 250

## 2015-06-30 MED ORDER — LIPO-FLAVONOID PLUS PO TABS: 2.0000 | ORAL_TABLET | Freq: Every day | ORAL | Status: DC

## 2015-06-30 MED ORDER — POLYETHYLENE GLYCOL 3350 17 G PO PACK
17.0000 g | PACK | Freq: Every day | ORAL | Status: DC | PRN
  Administered 2015-07-01 – 2015-07-04 (×2): 17 g via ORAL
  Filled 2015-06-30 (×2): qty 1

## 2015-06-30 MED ORDER — HEPARIN BOLUS VIA INFUSION
3000.0000 [IU] | Freq: Once | INTRAVENOUS | Status: AC
  Administered 2015-06-30: 3000 [IU] via INTRAVENOUS

## 2015-06-30 MED ORDER — ZINC 25 MG PO TABS: 1.0000 | ORAL_TABLET | Freq: Every day | ORAL | Status: DC

## 2015-06-30 MED ORDER — SODIUM CHLORIDE 0.9% FLUSH
3.0000 mL | Freq: Two times a day (BID) | INTRAVENOUS | Status: DC
  Administered 2015-07-01 – 2015-07-06 (×9): 3 mL via INTRAVENOUS

## 2015-06-30 MED ORDER — ATORVASTATIN CALCIUM 40 MG PO TABS
80.0000 mg | ORAL_TABLET | Freq: Every day | ORAL | Status: DC
  Administered 2015-07-01 – 2015-07-06 (×6): 80 mg via ORAL
  Filled 2015-06-30 (×3): qty 2
  Filled 2015-06-30 (×2): qty 4
  Filled 2015-06-30 (×2): qty 2

## 2015-06-30 MED ORDER — VITAMIN D 1000 UNITS PO TABS
2000.0000 [IU] | ORAL_TABLET | Freq: Every day | ORAL | Status: DC
  Administered 2015-07-01 – 2015-07-06 (×6): 2000 [IU] via ORAL
  Filled 2015-06-30 (×6): qty 2

## 2015-06-30 MED ORDER — PREDNISONE 10 MG PO TABS
5.0000 mg | ORAL_TABLET | Freq: Every day | ORAL | Status: DC
  Administered 2015-07-01 – 2015-07-06 (×6): 5 mg via ORAL
  Filled 2015-06-30 (×6): qty 1

## 2015-06-30 MED ORDER — ACETAMINOPHEN 325 MG PO TABS
650.0000 mg | ORAL_TABLET | Freq: Four times a day (QID) | ORAL | Status: DC | PRN
  Administered 2015-07-03: 650 mg via ORAL
  Filled 2015-06-30: qty 2

## 2015-06-30 MED ORDER — VITAMIN B-12 1000 MCG PO TABS
1000.0000 ug | ORAL_TABLET | Freq: Every day | ORAL | Status: DC
  Administered 2015-07-01 – 2015-07-06 (×6): 1000 ug via ORAL
  Filled 2015-06-30 (×6): qty 1

## 2015-06-30 MED ORDER — ASPIRIN EC 81 MG PO TBEC
81.0000 mg | DELAYED_RELEASE_TABLET | Freq: Every day | ORAL | Status: DC
  Administered 2015-07-01 – 2015-07-06 (×6): 81 mg via ORAL
  Filled 2015-06-30 (×6): qty 1

## 2015-06-30 MED ORDER — TECHNETIUM TC 99M DIETHYLENETRIAME-PENTAACETIC ACID
30.0000 | Freq: Once | INTRAVENOUS | Status: AC | PRN
  Administered 2015-06-30: 33 via RESPIRATORY_TRACT

## 2015-06-30 NOTE — Telephone Encounter (Signed)
   Diagnosis:    Result(s)   Card 1: Positive           Completed by: Larose Hires, LPN   HEMOCCULT SENSA DEVELOPER: LOT#: [id number]  EXPIRATION DATE: 917   HEMOCCULT SENSA CARD:  LOT#:  [id number] EXPIRATION DATE: 07/18   CARD CONTROL RESULTS:  POSITIVE: Positive NEGATIVE: Negative    ADDITIONAL COMMENTS: Patient had a procedure 06/27/2015. Forwarded to Dr.Rehman for review.

## 2015-06-30 NOTE — ED Notes (Signed)
MD at bedside. 

## 2015-06-30 NOTE — Progress Notes (Signed)
ANTICOAGULATION CONSULT NOTE - Initial Consult  Pharmacy Consult for Heparin Indication: DVT  Allergies  Allergen Reactions  . Penicillins Itching    Has patient had a PCN reaction causing immediate rash, facial/tongue/throat swelling, SOB or lightheadedness with hypotension: Yes Has patient had a PCN reaction causing severe rash involving mucus membranes or skin necrosis: No Has patient had a PCN reaction that required hospitalization No Has patient had a PCN reaction occurring within the last 10 years: No If all of the above answers are "NO", then may proceed with Cephalosporin use.    . Plaquenil [Hydroxychloroquine Sulfate] Itching and Other (See Comments)    redness    Patient Measurements: Height: 5\' 7"  (170.2 cm) Weight: 183 lb (83.008 kg) IBW/kg (Calculated) : 66.1 HEPARIN DW (KG): 82.7   Vital Signs: Temp: 98.2 F (36.8 C) (03/27 1120) Temp Source: Oral (03/27 1120) BP: 120/80 mmHg (03/27 1630) Pulse Rate: 80 (03/27 1630)  Labs:  Recent Labs  06/28/15 0430 06/30/15 1134 06/30/15 1325  HGB 9.3* 9.0*  --   HCT 29.5* 29.0*  --   PLT  --  284  --   LABPROT  --   --  15.6*  INR  --   --  1.22  CREATININE  --  1.40*  --   TROPONINI  --   --  0.33*    Estimated Creatinine Clearance: 41.9 mL/min (by C-G formula based on Cr of 1.4).   Medical History: Past Medical History  Diagnosis Date  . Anemia   . Coronary artery disease    Medications:   (Not in a hospital admission) Home meds reviewed.  Assessment: Okay for Protocol, recently admitted with anemia and AVMs. He underwent endoscopy and clipping of an AVM by Dr 07/02/15.  New DVT diagnoses today. Hg 9.0. Baseline anti-coag labs noted (slightly elevated PT).  Home Plavix stopped during previous admission.    Goal of Therapy:  Heparin level 0.3-0.7 units/ml Monitor platelets by anticoagulation protocol: Yes   Plan:  Will dose conservatively due to recent GI Issues. Give 3000 units bolus x 1 Start  heparin infusion at 1100 units/hr Check anti-Xa level in 6-8 hours and daily while on heparin Continue to monitor H&H and platelets  Renae Fickle 06/30/2015,6:40 PM

## 2015-06-30 NOTE — ED Notes (Signed)
Pt requesting food. MD gave approval.

## 2015-06-30 NOTE — Discharge Summary (Signed)
Physician Discharge Summary  Leon Freeman AXE:[id number] DOB: December 02, 1931 DOA: 06/27/2015  PCP: ADDIS,DANIEL, DO  Admit date: 06/27/2015 Discharge date: 06/28/2015   Recommendations for Outpatient Follow-up:  Patient will resume aspirin on 06/30/2015. Repeat EGD next week.  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 Coronary artery disease. Patient has prosthetic aortic valve. Hyperlipidemia. Chronic GERD. Rheumatoid arthritis.   Discharge Condition: Stable  Diet recommendation: Heart healthy diet.   Hospital Course:   Patient is 82 year old Caucasian male who was seen in the office on 06/26/2015 with anemia and heme positive stool. His hemoglobin was 8.1. Patient had been on low-dose aspirin and Plavix until the time of office visit. He had microcytic anemia and therefore felt to be losing blood chronically. He has history of peptic ulcer disease secondary to NSAID diagnosed 3 years ago.  Patient was scheduled for outpatient esophagogastroduodenoscopy on 06/27/2015. EGD revealed hiatal hernia antral scar and bleeding from post bulbar duodenum felt to be secondary to AV malformation. Bleeding site was located to the left of minor papilla. Single clip was applied with hemostasis. Patient's hemoglobin was checked and was 6.7. Patient was therefore admitted as an observation for blood transfusion. He was given 2 units of PRBCs. He did not experience hematemesis or melena during the night. Post transfusion H&H was 9.3 and 29.5. He would be returning for follow-up EGD next week. Will start him on by mouth iron after his next EGD.     Discharge medications     Medication List    STOP taking these medications        clopidogrel 75 MG tablet  Commonly known as:  PLAVIX      TAKE these medications        aspirin EC 81 MG tablet  Take 1 tablet (81 mg total) by mouth daily.   Start taking on:  07/01/2015     atorvastatin 80 MG tablet  Commonly known as:  LIPITOR  Take 80 mg by mouth daily.     BIOTIN PO  Take 1 tablet by mouth daily.     BIOTINEX PO  Take 10,000 mcg by mouth daily.     calcium gluconate 500 MG tablet  Take 1 tablet by mouth daily.     clindamycin 300 MG capsule  Commonly known as:  CLEOCIN  Take 300 mg by mouth See admin instructions. Patient will takes 2 by mouth prior to dental treatment.     GLUCOSAMINE 1500 COMPLEX Caps  Take 1 capsule by mouth daily.     LIPO-FLAVONOID PLUS Tabs  Take 2 tablets by mouth daily.     metoprolol succinate 50 MG 24 hr tablet  Commonly known as:  TOPROL-XL  Take 50 mg by mouth daily. Take with or immediately following a meal.     METROLOTION 0.75 % Lotn  Generic drug:  METRONIDAZOLE (TOPICAL)  Apply topically daily.     omeprazole 40 MG capsule  Commonly known as:  PRILOSEC  Take 40 mg by mouth daily.     predniSONE 5 MG tablet  Commonly known as:  DELTASONE  Take 5 mg by mouth daily with breakfast.     vitamin B-12 1000 MCG tablet  Commonly known as:  CYANOCOBALAMIN  Take 1,000 mcg by mouth daily.     vitamin C 500 MG tablet  Commonly known as:  ASCORBIC ACID  Take 500 mg by mouth daily.  Vitamin D 2000 units Caps  Take 1 capsule by mouth daily.     Zinc 25 MG Tabs  Take 1 tablet by mouth daily.         SignedMalissa Hippo  [facility] 06/30/2015, 9:50 PM

## 2015-06-30 NOTE — ED Notes (Signed)
Pt oxygen saturation between 84% and 88%. Pt placed on 2L oxygen. Increase in saturation to 95%

## 2015-06-30 NOTE — Telephone Encounter (Signed)
Patient needs to be scheduled for repeat EGD this week; possibly on Wednesday.

## 2015-06-30 NOTE — ED Provider Notes (Signed)
CSN: [id number]     Arrival date & time 06/30/15  1059 History   First MD Initiated Contact with Patient 06/30/15 1308     Chief Complaint  Patient presents with  . Loss of Consciousness     (Consider location/radiation/quality/duration/timing/severity/associated sxs/prior Treatment) HPI Comments: Patient from home with a fall and syncopal episode 2 days ago. He had endoscopy on March 24 with banding of AV malformation. He stayed overnight for a blood transfusion. He went home and felt weak, dizzy and lightheaded. He fell 2 days ago onto his left arm denies hitting his head. Uncertain if he lost consciousness. He is on Plavix and aspirin which have been held since his EGD. No other anticoagulation. Denies chest pain or shortness of breath. Found to be hypoxic on arrival to the ED. Denies shortness of breath. 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 Daughter reports patient is supposed to have a repeat endoscopy this week to have cauterization. He denies any dizziness lightheadedness with standing. He denies abdominal pain, chest pain or back pain. Denies bloody or black stools since being discharged. Reports no BM.  Patient is a 82 y.o. male presenting with syncope. The history is provided by the patient and a relative.  Loss of Consciousness Associated symptoms: dizziness and weakness   Associated symptoms: no chest pain, no fever, no headaches, no nausea, no palpitations, no shortness of breath and no vomiting     Past Medical History  Diagnosis Date  . Anemia   . Coronary artery disease    Past Surgical History  Procedure Laterality Date  . Cardiac valve surgery  2009  . Trigger finger release Left   . Leg surgery    . Colonoscopy  2015    Dr.Spainhour  . Upper gastrointestinal endoscopy  2016    Dr.Spainhour  . Appendectomy    . Eye surgery Left   . Coronary artery bypass graft     Family History  Problem Relation Age of Onset  . Dementia Mother   . Heart disease Father    . Healthy Daughter   . Bipolar disorder Son    Social History  Substance Use Topics  . Smoking status: Former Smoker    Quit date: 06/26/1975  . Smokeless tobacco: Never Used  . Alcohol Use: No     Comment: Patient quit 40 years ago.    Review of Systems  Constitutional: Positive for activity change, appetite change and fatigue. Negative for fever.  HENT: Negative for congestion.   Eyes: Negative for visual disturbance.  Respiratory: Negative for cough, chest tightness and shortness of breath.   Cardiovascular: Positive for syncope. Negative for chest pain and palpitations.  Gastrointestinal: Positive for blood in stool. Negative for nausea, vomiting and abdominal distention.  Genitourinary: Negative for dysuria, hematuria and testicular pain.  Musculoskeletal: Positive for myalgias and arthralgias. Negative for back pain.  Skin: Negative for wound.  Neurological: Positive for dizziness, syncope, weakness and light-headedness. Negative for headaches.   A complete 10 system review of systems was obtained and all systems are negative except as noted in the HPI and PMH.      Allergies  Penicillins and Plaquenil  Home Medications   Prior to Admission medications   Medication Sig Start Date End Date Taking? Authorizing Provider  aspirin EC 81 MG tablet Take 1 tablet (81 mg total) by mouth daily. 07/01/15  Yes Malissa Hippo, MD  atorvastatin (LIPITOR) 80 MG tablet Take 80 mg by mouth daily.   Yes  Historical Provider, MD  BIOTIN PO Take 1 tablet by mouth daily.   Yes Historical Provider, MD  calcium gluconate 500 MG tablet Take 1 tablet by mouth daily.   Yes Historical Provider, MD  Cholecalciferol (VITAMIN D) 2000 units CAPS Take 1 capsule by mouth daily.    Yes Historical Provider, MD  Glucosamine-Chondroit-Vit C-Mn (GLUCOSAMINE 1500 COMPLEX) CAPS Take 1 capsule by mouth daily.    Yes Historical Provider, MD  Lactobacillus (BIOTINEX PO) Take 10,000 mcg by mouth daily.   Yes  Historical Provider, MD  metoprolol succinate (TOPROL-XL) 50 MG 24 hr tablet Take 50 mg by mouth daily. Take with or immediately following a meal.   Yes Historical Provider, MD  METRONIDAZOLE, TOPICAL, (METROLOTION) 0.75 % LOTN Apply topically daily.   Yes Historical Provider, MD  omeprazole (PRILOSEC) 40 MG capsule Take 40 mg by mouth daily.   Yes Historical Provider, MD  predniSONE (DELTASONE) 5 MG tablet Take 5 mg by mouth daily with breakfast.   Yes Historical Provider, MD  vitamin B-12 (CYANOCOBALAMIN) 1000 MCG tablet Take 1,000 mcg by mouth daily.   Yes Historical Provider, MD  vitamin C (ASCORBIC ACID) 500 MG tablet Take 500 mg by mouth daily.   Yes Historical Provider, MD  Vitamins-Lipotropics (LIPO-FLAVONOID PLUS) TABS Take 2 tablets by mouth daily.   Yes Historical Provider, MD  Zinc 25 MG TABS Take 1 tablet by mouth daily.    Yes Historical Provider, MD  clindamycin (CLEOCIN) 300 MG capsule Take 300 mg by mouth See admin instructions. Patient will takes 2 by mouth prior to dental treatment.    Historical Provider, MD   BP 117/73 mmHg  Pulse 96  Temp(Src) 98.2 F (36.8 C) (Oral)  Resp 16  Ht 5\' 7"  (1.702 m)  Wt 180 lb 11.2 oz (81.965 kg)  BMI 28.29 kg/m2  SpO2 96% Physical Exam  Constitutional: He is oriented to person, place, and time. He appears well-developed and well-nourished. No distress.  HENT:  Head: Normocephalic and atraumatic.  Mouth/Throat: Oropharynx is clear and moist. No oropharyngeal exudate.  Pale appearing,  pallor  Eyes: Conjunctivae and EOM are normal. Pupils are equal, round, and reactive to light.  Neck: Normal range of motion. Neck supple.  No meningismus.  Cardiovascular: Normal rate, regular rhythm, normal heart sounds and intact distal pulses.   No murmur heard. Pulmonary/Chest: Effort normal and breath sounds normal. No respiratory distress. He exhibits no tenderness.  Abdominal: Soft. There is no tenderness. There is no rebound and no guarding.   Genitourinary:  Chaperone present, no gross blood  Musculoskeletal: Normal range of motion. He exhibits no edema or tenderness.  Neurological: He is alert and oriented to person, place, and time. No cranial nerve deficit. He exhibits normal muscle tone. Coordination normal.  No ataxia on finger to nose bilaterally. No pronator drift. 5/5 strength throughout. CN 2-12 intact.Equal grip strength. Sensation intact.   Skin: Skin is warm.  Ecchymosis LUE  Psychiatric: He has a normal mood and affect. His behavior is normal.  Nursing note and vitals reviewed.   ED Course  Procedures (including critical care time) Labs Review Labs Reviewed  BASIC METABOLIC PANEL - Abnormal; Notable for the following:    Sodium 134 (*)    CO2 21 (*)    Glucose, Bld 132 (*)    BUN 33 (*)    Creatinine, Ser 1.40 (*)    Calcium 8.8 (*)    GFR calc non Af Amer 45 (*)    GFR  calc Af Amer 52 (*)    All other components within normal limits  CBC - Abnormal; Notable for the following:    RBC 3.79 (*)    Hemoglobin 9.0 (*)    HCT 29.0 (*)    MCV 76.5 (*)    MCH 23.7 (*)    RDW 16.9 (*)    All other components within normal limits  PROTIME-INR - Abnormal; Notable for the following:    Prothrombin Time 15.6 (*)    All other components within normal limits  TROPONIN I - Abnormal; Notable for the following:    Troponin I 0.33 (*)    All other components within normal limits  URINALYSIS, ROUTINE W REFLEX MICROSCOPIC (NOT AT [facility])  HEPARIN LEVEL (UNFRACTIONATED)  CBC  POC OCCULT BLOOD, ED  TYPE AND SCREEN    Imaging Review Dg Chest 2 View  06/30/2015  CLINICAL DATA:  Loss of consciousness at home. EXAM: CHEST  2 VIEW COMPARISON:  September 06, 2010. FINDINGS: Stable cardiomediastinal silhouette. Status post coronary artery bypass graft. Status post aortic valve replacement. No pneumothorax or pleural effusion is noted. No acute pulmonary disease is noted. Bony thorax is unremarkable. IMPRESSION: No active  cardiopulmonary disease. Electronically Signed   By: Lupita Raider, M.D.   On: 06/30/2015 14:30   Dg Forearm Left  06/30/2015  CLINICAL DATA:  Recent fall with left forearm bruising, initial encounter EXAM: LEFT FOREARM - 2 VIEW COMPARISON:  None. FINDINGS: No acute fracture or dislocation is noted. Degenerative changes are noted about the wrist joint. No gross soft tissue abnormality is seen. IMPRESSION: No acute abnormality noted. Electronically Signed   By: Alcide Clever M.D.   On: 06/30/2015 14:33   Ct Head Wo Contrast  06/30/2015  CLINICAL DATA:  Recent episode of loss of consciousness. EXAM: CT HEAD WITHOUT CONTRAST TECHNIQUE: Contiguous axial images were obtained from the base of the skull through the vertex without intravenous contrast. COMPARISON:  None. FINDINGS: There is moderate diffuse atrophy with ventricles in proportion relatively larger than the sulci. There is no intracranial mass, hemorrhage, extra-axial fluid collection, or midline shift. There is mild patchy small vessel disease in the centra semiovale bilaterally. There is asymmetric decreased attenuation in the anterior right corona radiata, consistent with focal age uncertain white matter infarct. Elsewhere gray-white compartments appear normal. The bony calvarium appears intact. The mastoid air cells are clear. No intraorbital lesions are apparent. 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 Elsewhere there is atrophy with mild periventricular small vessel disease. No hemorrhage or mass effect. Note that the ventricles in proportion are slightly larger than the sulci. Question early normal pressure hydrocephalus superimposed on atrophy. Electronically Signed   By: Bretta Bang III M.D.   On: 06/30/2015 13:55   US Carotid Bilateral  06/30/2015  CLINICAL DATA:  Syncope versus narcolepsy. Hypertension, hyperlipidemia, previous tobacco abuse, coronary disease EXAM: BILATERAL  CAROTID DUPLEX ULTRASOUND TECHNIQUE: Wallace Cullens scale imaging, color Doppler and duplex ultrasound was performed of bilateral carotid and vertebral arteries in the neck. COMPARISON:  None. REVIEW OF SYSTEMS: Quantification of carotid stenosis is based on velocity parameters that correlate the residual internal carotid diameter with NASCET-based stenosis levels, using the diameter of the distal internal carotid lumen as the denominator for stenosis measurement. The following velocity measurements were obtained: PEAK SYSTOLIC/END DIASTOLIC RIGHT ICA:                     96/38cm/sec CCA:  90/27cm/sec SYSTOLIC ICA/CCA RATIO:  1.1 DIASTOLIC ICA/CCA RATIO: 1.6 ECA:                     50cm/sec LEFT ICA:                     210/68cm/sec CCA:                     100/24cm/sec SYSTOLIC ICA/CCA RATIO:  2.3 DIASTOLIC ICA/CCA RATIO: 2.6 ECA:                     231cm/sec FINDINGS: RIGHT CAROTID ARTERY: Eccentric partially calcified nonocclusive plaque in the mid and distal common carotid artery and bulb extending to the origin of the ICA. No high-grade stenosis. Normal waveforms and color Doppler signal. RIGHT VERTEBRAL ARTERY:  Normal flow direction and waveform. LEFT CAROTID ARTERY: Eccentric nonocclusive plaque in the mid common carotid artery. More extensive circumferential partially calcified plaque in the carotid bulb extending into proximal internal and external carotid arteries resulting in at least mild stenosis. There are elevated peak systolic velocities in the proximal external carotid artery and mid ICA distal to the plaque. LEFT VERTEBRAL ARTERY: Normal flow direction and waveform. IMPRESSION: 1. Bilateral carotid bifurcation and proximal ICA plaque, resulting in less than 50% diameter stenosis on the right, 50-69% diameter stenosis on the left. The exam does not exclude plaque ulceration or embolization. Continued surveillance recommended. Electronically Signed   By: Corlis Leak M.D.   On: 06/30/2015  17:37   Nm Pulmonary Perf And Vent  06/30/2015  CLINICAL DATA:  Syncopal events. EXAM: NUCLEAR MEDICINE VENTILATION - PERFUSION LUNG SCAN TECHNIQUE: Ventilation images were obtained in multiple projections using inhaled aerosol Tc-9m DTPA. Perfusion images were obtained in multiple projections after intravenous injection of Tc-50m MAA. RADIOPHARMACEUTICALS:  33 mCi Technetium-88m DTPA aerosol inhalation and 3.3 mCi Technetium-68m MAA IV COMPARISON:  Chest radiograph from earlier today. FINDINGS: Ventilation: Mildly heterogeneous ventilation throughout both lungs, suggesting airways disease. Perfusion: No unmatched perfusion defects. There is a single moderate matched perfusion defect in the right upper lobe. IMPRESSION: Low probability for pulmonary embolism (10-19%). Electronically Signed   By: Delbert Phenix M.D.   On: 06/30/2015 19:02   US Venous Img Lower Bilateral  06/30/2015  CLINICAL DATA:  Bilateral lower extremity edema x8 months, right worse than left. EXAM: Bilateral LOWER EXTREMITY VENOUS DOPPLER ULTRASOUND TECHNIQUE: Gray-scale sonography with compression, as well as color and duplex ultrasound, were performed to evaluate the deep venous system from the level of the common femoral vein through the popliteal and proximal calf veins. COMPARISON:  None FINDINGS: On the right, Normal compressibility of the common femoral, superficial femoral, and popliteal veins. There is a distended thrombosed peroneal vein which is noncompressible with no flow signal on color Doppler. On the left, Normal compressibility of the common femoral, superficial femoral, and popliteal veins, as well as the proximal calf veins. No filling defects to suggest DVT on grayscale or color Doppler imaging. Doppler waveforms show normal direction of venous flow, normal respiratory phasicity and response to augmentation. IMPRESSION: 1. POSITIVE for isolated right peroneal vein (calf) DVT. 2. No left lower extremity DVT. These results  will be called to the ordering clinician or representative by the Radiologist Assistant, and communication documented in the PACS or zVision Dashboard. Electronically Signed   By: Corlis Leak M.D.   On: 06/30/2015 17:33   I have personally reviewed and evaluated these images  and lab results as part of my medical decision-making.   EKG Interpretation   Date/Time:  Monday June 30 2015 11:29:23 EDT Ventricular Rate:  96 PR Interval:  149 QRS Duration: 150 QT Interval:  372 QTC Calculation: 470 R Axis:   -108 Text Interpretation:  Sinus rhythm Atrial premature complexes Right bundle  branch block No previous ECGs available Abnormal ekg Confirmed by Bebe Shaggy   MD, Dorinda Hill ([id number]) on 06/30/2015 11:41:33 AM      MDM   Final diagnoses:  Syncope, unspecified syncope type  Elevated troponin  Symptomatic anemia   Near syncopal episode with recent daily. Pale appearing. Vital stable. Orthostatics negative. Hypoxic but denies shortness of breath. EKG has no comparison.  Hemoglobin 9.0. Was 9.3 upon discharge March 25.  EGD performed March 24 showed small hiatal hernia, normal esophagus, AVM lesion in the duodenum that was clipped.  EKG is reassuring. Orthostatics are negative. Troponin positive is 0.33. Patient denies any chest pain but in retrospect he said he did have an episode of brief chest pain prior to his EGD and did not alert anyone. CT without bleed but does have small infarct of unknown age.  EKG has right bundle branch block. No comparison. Discussed with cardiology Dr. Purvis Sheffield. He suspects possible demand ischemia. We will not give heparin at this time given his recent GI bleed. Cardiology recommends repeat enzymes every 6 hours.  Doppler positive for DVT. Patient also with new onset hypoxia, negative chest x-ray. Denies any chest pain or shortness of breath currently.  VQ scan obtained given patient's elevated creatinine.  D/w Dr. Karilyn Cota he is aware of patient's DVT. He  agrees with heparin with 3000 unit bolus and heparin drip. Admission dw Dr. Arlean Hopping.   CRITICAL CARE Performed by: Glynn Octave Total critical care time: 45 minutes Critical care time was exclusive of separately billable procedures and treating other patients. Critical care was necessary to treat or prevent imminent or life-threatening deterioration. Critical care was time spent personally by me on the following activities: development of treatment plan with patient and/or surrogate as well as nursing, discussions with consultants, evaluation of patient's response to treatment, examination of patient, obtaining history from patient or surrogate, ordering and performing treatments and interventions, ordering and review of laboratory studies, ordering and review of radiographic studies, pulse oximetry and re-evaluation of patient's condition.   Glynn Octave, MD 06/30/15 [phone]

## 2015-06-30 NOTE — ED Notes (Signed)
Patient with LOC Saturday at home after d/c from hosptial with GI bleed, EGD with banding, and blood transfusion. No futher LOC episodes. Alert/oriented/ambulatory. Pale.

## 2015-06-30 NOTE — H&P (Addendum)
[facility] History and Physical  Leon Freeman VQX:[id number] DOB: 1931/05/30 DOA: 06/30/2015  Referring physician: Dr Manus Gunning PCP: ADDIS,DANIEL, DO   Chief Complaint: Passing out  HPI: Leon Freeman is a 82 y.o. male with hx of CAD/CABG/ porcine AVR 2009, R CEA < 2009, CM EF ~20% f/b cardiology in [place], [place].  He lives in [place], his daughter is a Engineer, civil (consulting) here in the PACU.  He presents with episodes of passing out and/or falling asleep.    The daughter provides much of the history.  Patient was seen 10 days ago by PCP and labs returned 7 days ago on Monday with a low Hb. His plavix was stopped and he was set up for EGD on Friday 3 days ago.  EGD showed a single AVM in the distral duodenum which bled when irritated. It was clipped.  He was kept overnight and got 2u prbc with increase in Hb to 9 and was dc'd on Sat two days ago. Over the weekend he has been having multiple episodes of falling asleep while in the sitting or standing position.  He fell one time and has a bruise on his left arm. The fall wasn't witnessed.  He says that he does remember the falling asleep episodes and he would mitigate falling by holding onto the counter so as not to fall.  He is not sure why he is so tired.  He denies any urinary incontinence or tongue-biting, family denies any post-ictal state.  He denies any associated symptoms of lightheadness, blurred vision or vertigo during these episodes.    He takes a beta-blocker, statin, vitamins, PPI , asa , plavix, pred 5/d for RA.  BP here is 110-120 systolic.  HR is mid 80's - 90's. No fever.  No SOB, CP or cough.  Does have ankle swelling, he didn't notice. No diuretics.  No hx of angina since his CABG in 2009.    ROS  denies CP  no joint pain   no HA  no blurry vision  no rash  no diarrhea  no nausea/ vomiting  no dysuria  no difficulty voiding  no change in urine color    Where does patient live home  Can patient participate in ADLs? yes  Past  Medical History  Past Medical History  Diagnosis Date  . Anemia   . Coronary artery disease    Past Surgical History  Past Surgical History  Procedure Laterality Date  . Cardiac valve surgery  2009  . Trigger finger release Left   . Leg surgery    . Colonoscopy  2015    Dr.Spainhour  . Upper gastrointestinal endoscopy  2016    Dr.Spainhour  . Appendectomy    . Eye surgery Left   . Coronary artery bypass graft     Family History  Family History  Problem Relation Age of Onset  . Dementia Mother   . Heart disease Father   . Healthy Daughter   . Bipolar disorder Son    Social History  reports that he quit smoking about 40 years ago. He has never used smokeless tobacco. He reports that he does not drink alcohol. His drug history is not on file. Allergies  Allergies  Allergen Reactions  . Penicillins Itching    Has patient had a PCN reaction causing immediate rash, facial/tongue/throat swelling, SOB or lightheadedness with hypotension: Yes Has patient had a PCN reaction causing severe rash involving mucus membranes or skin necrosis: No Has patient had a PCN reaction that  required hospitalization No Has patient had a PCN reaction occurring within the last 10 years: No If all of the above answers are "NO", then may proceed with Cephalosporin use.    . Plaquenil [Hydroxychloroquine Sulfate] Itching and Other (See Comments)    redness   Home medications Prior to Admission medications   Medication Sig Start Date End Date Taking? Authorizing Provider  aspirin EC 81 MG tablet Take 1 tablet (81 mg total) by mouth daily. 07/01/15  Yes Malissa Hippo, MD  atorvastatin (LIPITOR) 80 MG tablet Take 80 mg by mouth daily.   Yes Historical Provider, MD  BIOTIN PO Take 1 tablet by mouth daily.   Yes Historical Provider, MD  calcium gluconate 500 MG tablet Take 1 tablet by mouth daily.   Yes Historical Provider, MD  Cholecalciferol (VITAMIN D) 2000 units CAPS Take 1 capsule by mouth daily.     Yes Historical Provider, MD  Glucosamine-Chondroit-Vit C-Mn (GLUCOSAMINE 1500 COMPLEX) CAPS Take 1 capsule by mouth daily.    Yes Historical Provider, MD  Lactobacillus (BIOTINEX PO) Take 10,000 mcg by mouth daily.   Yes Historical Provider, MD  metoprolol succinate (TOPROL-XL) 50 MG 24 hr tablet Take 50 mg by mouth daily. Take with or immediately following a meal.   Yes Historical Provider, MD  METRONIDAZOLE, TOPICAL, (METROLOTION) 0.75 % LOTN Apply topically daily.   Yes Historical Provider, MD  omeprazole (PRILOSEC) 40 MG capsule Take 40 mg by mouth daily.   Yes Historical Provider, MD  predniSONE (DELTASONE) 5 MG tablet Take 5 mg by mouth daily with breakfast.   Yes Historical Provider, MD  vitamin B-12 (CYANOCOBALAMIN) 1000 MCG tablet Take 1,000 mcg by mouth daily.   Yes Historical Provider, MD  vitamin C (ASCORBIC ACID) 500 MG tablet Take 500 mg by mouth daily.   Yes Historical Provider, MD  Vitamins-Lipotropics (LIPO-FLAVONOID PLUS) TABS Take 2 tablets by mouth daily.   Yes Historical Provider, MD  Zinc 25 MG TABS Take 1 tablet by mouth daily.    Yes Historical Provider, MD  clindamycin (CLEOCIN) 300 MG capsule Take 300 mg by mouth See admin instructions. Patient will takes 2 by mouth prior to dental treatment.    Historical Provider, MD   Liver Function Tests No results for input(s): AST, ALT, ALKPHOS, BILITOT, PROT, ALBUMIN in the last 168 hours. No results for input(s): LIPASE, AMYLASE in the last 168 hours. CBC  Recent Labs Lab 06/27/15 1425 06/28/15 0430 06/30/15 1134  WBC  --   --  9.8  HGB 6.7* 9.3* 9.0*  HCT 22.5* 29.5* 29.0*  MCV  --   --  76.5*  PLT  --   --  284   Basic Metabolic Panel  Recent Labs Lab 06/30/15 1134  NA 134*  K 4.3  CL 104  CO2 21*  GLUCOSE 132*  BUN 33*  CREATININE 1.40*  CALCIUM 8.8*     Filed Vitals:   06/30/15 1200 06/30/15 1230 06/30/15 1300 06/30/15 1330  BP: 121/76 109/75 116/68 120/73  Pulse: 86 84 86 86  Temp:       TempSrc:      Resp: 11 11 11 18   Height:      Weight:      SpO2: 93% 95% 92% 94%   Exam: Gen not orthostatic, alert, elderly male No rash, cyanosis or gangrene Sclera anicteric, throat clear and slightly dry  No jvd or bruits Chest clear bilat RRR soft late syst M , no RG Abd soft ntnd  no mass or ascites +bs GU normal male MS no joint effusions or deformity Ext 1-2+ edema pretib R > L/ no wounds or ulcers Neuro is alert, Ox 3 , nf, VF"s intact   EKG (independently reviewed) > NSR 96, RBBB, PAC's CXR (independently reviewed) > lungs clear, no active disease  Na 134  BUN 33  cReat 1.40  Ca 8.8 glu 132  Trop 0.33 Hb 9.0   WBC 9k  plt 284   Assessment: 1. Syncope vs fall from narcolepsy - pt had known poor EF 20%, no decomp CHF, recent GIB w new anemia but Hb stable since EGD last week.  On a beta-blocker which can cause drowsiness, no other meds implicated.  Has asymmetric LE edema, need to r/o DVT/PE. Also +trop but no cardiac symptoms.  Cardiology has been consulted.  He is falling asleep standing up multiple times, will hold BB for now. Also hypoxic 85-88% on presentation which could cause drowsiness; consider PE, underlying other lung disease (COPD, etc). May need home O2 if nothing else found.  Get V/Q, carotid doppler, LE dopplers, check TSH, cardiology input.   2. CM low EF , 20% range per family 3. Hx CABG/ AVR 2009 - on asa, plavix 4. Anemia 5. Recent GIB - sp AVM clip duodenum 6. RA on chronic pred 5 mg /d  Plan - as above   DVT Prophylaxis none, recent bleed  Code Status: full  Family Communication: all here  Disposition Plan: home when stable    Leon Freeman D [facility] Pager [phone]  Cell [phone]  If 7PM-7AM, please contact night-coverage [email or web address] Password [facility] 06/30/2015, 3:37 PM

## 2015-06-30 NOTE — Consult Note (Signed)
Reason for Consult:   Elevated Troponin  Requesting Physician: [facility] Primary Cardiologist [facility]  HPI:   82 y.o. male with hx of CAD- s/p CABG x 4 and porcine AVR in 2009 at [facility]. He has not had cardiac problems since. Other problems include PVD-R CEA < 2009, and ICM EF ~20% . He lives in [place], his daughter is a Engineer, civil (consulting) here in the PACU. He presents to the ED at [facility] today with episodes of passing out and/or falling asleep. The pt was recently admitted with anemia and AVMs. He underwent endoscopy and clipping of an AVM by Dr Renae Fickle. He was supposed to come back next week for clip removal and cautery. He was transfused and discharged Sat 3/25/1`7. Saturday night his wife heard him fall in the kitchen. When she got there he was OK. Later that night she says he "fell asleep" standing at the sink in the bathroom. He woke when she yelled at him. Since then he has had episode of falling asleep anytime he is at rest. She says he falls asleep at the table and his head goes right into his plate. The pt says he feels fine. He may have some baseline dementia- he defers most questions to his wife. He denied any chest pain but his wife said he did have chest pain before his transfusion this weekend.   PMHx:  Past Medical History  Diagnosis Date  . Anemia   . Coronary artery disease     Past Surgical History  Procedure Laterality Date  . Cardiac valve surgery  2009  . Trigger finger release Left   . Leg surgery    . Colonoscopy  2015    Dr.Spainhour  . Upper gastrointestinal endoscopy  2016    Dr.Spainhour  . Appendectomy    . Eye surgery Left   . Coronary artery bypass graft      SOCHx:  reports that he quit smoking about 40 years ago. He has never used smokeless tobacco. He reports that he does not drink alcohol. His drug history is not on file.  FAMHx: Family History  Problem Relation Age of Onset  . Dementia Mother   . Heart disease Father   .  Healthy Daughter   . Bipolar disorder Son     ALLERGIES: Allergies  Allergen Reactions  . Penicillins Itching    Has patient had a PCN reaction causing immediate rash, facial/tongue/throat swelling, SOB or lightheadedness with hypotension: Yes Has patient had a PCN reaction causing severe rash involving mucus membranes or skin necrosis: No Has patient had a PCN reaction that required hospitalization No Has patient had a PCN reaction occurring within the last 10 years: No If all of the above answers are "NO", then may proceed with Cephalosporin use.    . Plaquenil [Hydroxychloroquine Sulfate] Itching and Other (See Comments)    redness    ROS: Review of Systems: General: negative for chills, fever, night sweats or weight changes.  Cardiovascular: negative for chest pain, dyspnea on exertion, edema, orthopnea, palpitations, paroxysmal nocturnal dyspnea or shortness of breath HEENT: negative for any visual disturbances, blindness, glaucoma Dermatological: negative for rash Respiratory: negative for cough, hemoptysis, or wheezing Urologic: negative for hematuria or dysuria Abdominal: negative for nausea, vomiting, diarrhea, bright red blood per rectum, melena, or hematemesis Neurologic: negative for visual changes, syncope, or dizziness Musculoskeletal: negative for back pain, joint pain, or swelling Psych: cooperative and appropriate All other systems reviewed and are  otherwise negative except as noted above.   HOME MEDICATIONS: Prior to Admission medications   Medication Sig Start Date End Date Taking? Authorizing Provider  aspirin EC 81 MG tablet Take 1 tablet (81 mg total) by mouth daily. 07/01/15  Yes Malissa Hippo, MD  atorvastatin (LIPITOR) 80 MG tablet Take 80 mg by mouth daily.   Yes Historical Provider, MD  BIOTIN PO Take 1 tablet by mouth daily.   Yes Historical Provider, MD  calcium gluconate 500 MG tablet Take 1 tablet by mouth daily.   Yes Historical Provider, MD    Cholecalciferol (VITAMIN D) 2000 units CAPS Take 1 capsule by mouth daily.    Yes Historical Provider, MD  Glucosamine-Chondroit-Vit C-Mn (GLUCOSAMINE 1500 COMPLEX) CAPS Take 1 capsule by mouth daily.    Yes Historical Provider, MD  Lactobacillus (BIOTINEX PO) Take 10,000 mcg by mouth daily.   Yes Historical Provider, MD  metoprolol succinate (TOPROL-XL) 50 MG 24 hr tablet Take 50 mg by mouth daily. Take with or immediately following a meal.   Yes Historical Provider, MD  METRONIDAZOLE, TOPICAL, (METROLOTION) 0.75 % LOTN Apply topically daily.   Yes Historical Provider, MD  omeprazole (PRILOSEC) 40 MG capsule Take 40 mg by mouth daily.   Yes Historical Provider, MD  predniSONE (DELTASONE) 5 MG tablet Take 5 mg by mouth daily with breakfast.   Yes Historical Provider, MD  vitamin B-12 (CYANOCOBALAMIN) 1000 MCG tablet Take 1,000 mcg by mouth daily.   Yes Historical Provider, MD  vitamin C (ASCORBIC ACID) 500 MG tablet Take 500 mg by mouth daily.   Yes Historical Provider, MD  Vitamins-Lipotropics (LIPO-FLAVONOID PLUS) TABS Take 2 tablets by mouth daily.   Yes Historical Provider, MD  Zinc 25 MG TABS Take 1 tablet by mouth daily.    Yes Historical Provider, MD  clindamycin (CLEOCIN) 300 MG capsule Take 300 mg by mouth See admin instructions. Patient will takes 2 by mouth prior to dental treatment.    Historical Provider, MD    HOSPITAL MEDICATIONS: I have reviewed the patient's current medications.  VITALS: Blood pressure 120/80, pulse 80, temperature 98.2 F (36.8 C), temperature source Oral, resp. rate 13, height 5\' 7"  (1.702 m), weight 183 lb (83.008 kg), SpO2 95 %.  PHYSICAL EXAM: General appearance: alert, cooperative and no distress Neck: no carotid bruit and no JVD Lungs: clear to auscultation bilaterally Heart: regular rate and rhythm and 2/6 systolic murmur at AOV Abdomen: soft, non-tender; bowel sounds normal; no masses,  no organomegaly Extremities: trace edema Pulses: 2+ and  symmetric Skin: Skin color, texture, turgor normal. No rashes or lesions Neurologic: Grossly normal  LABS: Results for orders placed or performed during the hospital encounter of 06/30/15 (from the past 24 hour(s))  Basic metabolic panel     Status: Abnormal   Collection Time: 06/30/15 11:34 AM  Result Value Ref Range   Sodium 134 (L) 135 - 145 mmol/L   Potassium 4.3 3.5 - 5.1 mmol/L   Chloride 104 101 - 111 mmol/L   CO2 21 (L) 22 - 32 mmol/L   Glucose, Bld 132 (H) 65 - 99 mg/dL   BUN 33 (H) 6 - 20 mg/dL   Creatinine, Ser 07/02/15 (H) 0.61 - 1.24 mg/dL   Calcium 8.8 (L) 8.9 - 10.3 mg/dL   GFR calc non Af Amer 45 (L) >60 mL/min   GFR calc Af Amer 52 (L) >60 mL/min   Anion gap 9 5 - 15  CBC     Status: Abnormal  Collection Time: 06/30/15 11:34 AM  Result Value Ref Range   WBC 9.8 4.0 - 10.5 K/uL   RBC 3.79 (L) 4.22 - 5.81 MIL/uL   Hemoglobin 9.0 (L) 13.0 - 17.0 g/dL   HCT 16.1 (L) 09.6 - 04.5 %   MCV 76.5 (L) 78.0 - 100.0 fL   MCH 23.7 (L) 26.0 - 34.0 pg   MCHC 31.0 30.0 - 36.0 g/dL   RDW 40.9 (H) 81.1 - 91.4 %   Platelets 284 150 - 400 K/uL  Type and screen [facility]     Status: None   Collection Time: 06/30/15  1:25 PM  Result Value Ref Range   ABO/RH(D) A POS    Antibody Screen NEG    Sample Expiration 07/03/2015   Protime-INR     Status: Abnormal   Collection Time: 06/30/15  1:25 PM  Result Value Ref Range   Prothrombin Time 15.6 (H) 11.6 - 15.2 seconds   INR 1.22 0.00 - 1.49  Troponin I     Status: Abnormal   Collection Time: 06/30/15  1:25 PM  Result Value Ref Range   Troponin I 0.33 (H) <0.031 ng/mL  POC occult blood, ED Provider will collect     Status: None   Collection Time: 06/30/15  1:32 PM  Result Value Ref Range   Fecal Occult Bld NEGATIVE NEGATIVE  Urinalysis, Routine w reflex microscopic (not at [facility])     Status: None   Collection Time: 06/30/15  2:50 PM  Result Value Ref Range   Color, Urine YELLOW YELLOW   APPearance CLEAR CLEAR    Specific Gravity, Urine 1.025 1.005 - 1.030   pH 5.0 5.0 - 8.0   Glucose, UA NEGATIVE NEGATIVE mg/dL   Hgb urine dipstick NEGATIVE NEGATIVE   Bilirubin Urine NEGATIVE NEGATIVE   Ketones, ur NEGATIVE NEGATIVE mg/dL   Protein, ur NEGATIVE NEGATIVE mg/dL   Nitrite NEGATIVE NEGATIVE   Leukocytes, UA NEGATIVE NEGATIVE    EKG: NSR, RBBB rate 96  IMAGING: Dg Chest 2 View  06/30/2015  CLINICAL DATA:  Loss of consciousness at home. EXAM: CHEST  2 VIEW COMPARISON:  September 06, 2010. FINDINGS: Stable cardiomediastinal silhouette. Status post coronary artery bypass graft. Status post aortic valve replacement. No pneumothorax or pleural effusion is noted. No acute pulmonary disease is noted. Bony thorax is unremarkable. IMPRESSION: No active cardiopulmonary disease. Electronically Signed   By: Lupita Raider, M.D.   On: 06/30/2015 14:30   Dg Forearm Left  06/30/2015  CLINICAL DATA:  Recent fall with left forearm bruising, initial encounter EXAM: LEFT FOREARM - 2 VIEW COMPARISON:  None. FINDINGS: No acute fracture or dislocation is noted. Degenerative changes are noted about the wrist joint. No gross soft tissue abnormality is seen. IMPRESSION: No acute abnormality noted. Electronically Signed   By: Alcide Clever M.D.   On: 06/30/2015 14:33   Ct Head Wo Contrast  06/30/2015  CLINICAL DATA:  Recent episode of loss of consciousness. EXAM: CT HEAD WITHOUT CONTRAST TECHNIQUE: Contiguous axial images were obtained from the base of the skull through the vertex without intravenous contrast. COMPARISON:  None. FINDINGS: There is moderate diffuse atrophy with ventricles in proportion relatively larger than the sulci. There is no intracranial mass, hemorrhage, extra-axial fluid collection, or midline shift. There is mild patchy small vessel disease in the centra semiovale bilaterally. There is asymmetric decreased attenuation in the anterior right corona radiata, consistent with focal age uncertain white matter infarct.  Elsewhere gray-white compartments appear normal.  The bony calvarium appears intact. The mastoid air cells are clear. No intraorbital lesions are apparent. 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 Elsewhere there is atrophy with mild periventricular small vessel disease. No hemorrhage or mass effect. Note that the ventricles in proportion are slightly larger than the sulci. Question early normal pressure hydrocephalus superimposed on atrophy. Electronically Signed   By: Bretta Bang III M.D.   On: 06/30/2015 13:55    IMPRESSION: Principal Problem:   Syncope Active Problems:   Elevated troponin   Excessive sleepiness   Hx of CABG 2009   S/P AVR (aortic valve replacement) porcine 2009   Hypertension   GI bleed - duodenal AVM clipped March 2017   Rheumatoid arthritis (HCC)   Anemia   History of CEA (carotid endarterectomy) right    RECOMMENDATION: MD to see. Admit for monitoring. Cycle Troponin, check echo tomorrow.  Time Spent Directly with Patient: 45 minutes  Corine Shelter, [place]  [phone] beeper 06/30/2015, 5:13 PM   The patient was seen and examined, and I agree with the assessment and plan as documented above, with modifications as noted below. Pt with aforementioned history (CAD with CABG, AVR) followed by Dr. Durel Salts in [place] presented with episodes of "falling asleep". ECG shows sinus rhythm with RBBB and PAC's. Chest xray unremarkable. Troponins mildly elevated in setting of anemia (Hgb 9). No chest pain at present, most recently last Friday. Also has bilateral carotid artery stenosis (left 50-69%) and newly diagnosed right peroneal DVT. Head CT showed white matter infarct (?age) in anterior right corona radiata with possible early normal pressure hydrocephalus with atrophy.  Will need IV heparin for DVT. Would cycle troponins (initial 0.33). This may represent demand ischemia. Continue ASA and Lipitor  (metoprolol being held by hospitalist for narcolepsy). Will obtain echocardiogram on 3/28 to assess LV systolic function and regional wall motion. History of chronic systolic heart failure (EF 39% in 2009) prior to AVR.   Prentice Docker, MD, [facility]  06/30/2015 5:50 PM

## 2015-06-30 NOTE — Progress Notes (Signed)
Patient's LE doppler is + for peroneal DVT.  ED Dr Manus Gunning discussed with GI about anticoagulation, GI recommended reduced bolus IV heparin. See orders.  V/Q is pending.    Vinson Moselle MD [employer] pager [phone]    cell [phone] 06/30/2015, 6:36 PM

## 2015-07-01 ENCOUNTER — Encounter (INDEPENDENT_AMBULATORY_CARE_PROVIDER_SITE_OTHER): Payer: Self-pay

## 2015-07-01 ENCOUNTER — Other Ambulatory Visit (INDEPENDENT_AMBULATORY_CARE_PROVIDER_SITE_OTHER): Payer: Self-pay | Admitting: *Deleted

## 2015-07-01 ENCOUNTER — Encounter (HOSPITAL_COMMUNITY): Payer: Self-pay

## 2015-07-01 ENCOUNTER — Observation Stay (HOSPITAL_COMMUNITY): Payer: Medicare Other

## 2015-07-01 DIAGNOSIS — D509 Iron deficiency anemia, unspecified: Secondary | ICD-10-CM | POA: Diagnosis present

## 2015-07-01 DIAGNOSIS — M069 Rheumatoid arthritis, unspecified: Secondary | ICD-10-CM | POA: Diagnosis present

## 2015-07-01 DIAGNOSIS — Z7982 Long term (current) use of aspirin: Secondary | ICD-10-CM | POA: Diagnosis not present

## 2015-07-01 DIAGNOSIS — D62 Acute posthemorrhagic anemia: Secondary | ICD-10-CM | POA: Diagnosis present

## 2015-07-01 DIAGNOSIS — T447X5A Adverse effect of beta-adrenoreceptor antagonists, initial encounter: Secondary | ICD-10-CM | POA: Diagnosis present

## 2015-07-01 DIAGNOSIS — I5021 Acute systolic (congestive) heart failure: Secondary | ICD-10-CM | POA: Diagnosis not present

## 2015-07-01 DIAGNOSIS — Z954 Presence of other heart-valve replacement: Secondary | ICD-10-CM | POA: Diagnosis not present

## 2015-07-01 DIAGNOSIS — R7989 Other specified abnormal findings of blood chemistry: Secondary | ICD-10-CM

## 2015-07-01 DIAGNOSIS — R079 Chest pain, unspecified: Secondary | ICD-10-CM

## 2015-07-01 DIAGNOSIS — K922 Gastrointestinal hemorrhage, unspecified: Secondary | ICD-10-CM

## 2015-07-01 DIAGNOSIS — R0902 Hypoxemia: Secondary | ICD-10-CM | POA: Diagnosis present

## 2015-07-01 DIAGNOSIS — Z9889 Other specified postprocedural states: Secondary | ICD-10-CM | POA: Diagnosis not present

## 2015-07-01 DIAGNOSIS — I82491 Acute embolism and thrombosis of other specified deep vein of right lower extremity: Secondary | ICD-10-CM | POA: Diagnosis present

## 2015-07-01 DIAGNOSIS — I248 Other forms of acute ischemic heart disease: Secondary | ICD-10-CM | POA: Insufficient documentation

## 2015-07-01 DIAGNOSIS — K449 Diaphragmatic hernia without obstruction or gangrene: Secondary | ICD-10-CM | POA: Diagnosis present

## 2015-07-01 DIAGNOSIS — D649 Anemia, unspecified: Secondary | ICD-10-CM

## 2015-07-01 DIAGNOSIS — G4710 Hypersomnia, unspecified: Secondary | ICD-10-CM | POA: Diagnosis not present

## 2015-07-01 DIAGNOSIS — I2510 Atherosclerotic heart disease of native coronary artery without angina pectoris: Secondary | ICD-10-CM | POA: Diagnosis present

## 2015-07-01 DIAGNOSIS — Z87891 Personal history of nicotine dependence: Secondary | ICD-10-CM | POA: Diagnosis not present

## 2015-07-01 DIAGNOSIS — K31819 Angiodysplasia of stomach and duodenum without bleeding: Secondary | ICD-10-CM | POA: Diagnosis present

## 2015-07-01 DIAGNOSIS — G47419 Narcolepsy without cataplexy: Secondary | ICD-10-CM | POA: Diagnosis present

## 2015-07-01 DIAGNOSIS — I509 Heart failure, unspecified: Secondary | ICD-10-CM

## 2015-07-01 DIAGNOSIS — I5023 Acute on chronic systolic (congestive) heart failure: Secondary | ICD-10-CM | POA: Diagnosis present

## 2015-07-01 DIAGNOSIS — Z951 Presence of aortocoronary bypass graft: Secondary | ICD-10-CM | POA: Diagnosis not present

## 2015-07-01 DIAGNOSIS — R55 Syncope and collapse: Secondary | ICD-10-CM | POA: Diagnosis not present

## 2015-07-01 DIAGNOSIS — I10 Essential (primary) hypertension: Secondary | ICD-10-CM | POA: Diagnosis not present

## 2015-07-01 DIAGNOSIS — Z8249 Family history of ischemic heart disease and other diseases of the circulatory system: Secondary | ICD-10-CM | POA: Diagnosis not present

## 2015-07-01 DIAGNOSIS — I110 Hypertensive heart disease with heart failure: Secondary | ICD-10-CM | POA: Diagnosis present

## 2015-07-01 LAB — TROPONIN I: Troponin I: 0.25 ng/mL — ABNORMAL HIGH (ref <0.031)

## 2015-07-01 LAB — URINALYSIS, ROUTINE W REFLEX MICROSCOPIC
BILIRUBIN URINE: NEGATIVE
GLUCOSE, UA: NEGATIVE mg/dL
HGB URINE DIPSTICK: NEGATIVE
Ketones, ur: NEGATIVE mg/dL
Leukocytes, UA: NEGATIVE
NITRITE: NEGATIVE
PH: 5 (ref 5.0–8.0)
Protein, ur: NEGATIVE mg/dL
SPECIFIC GRAVITY, URINE: 1.02 (ref 1.005–1.030)

## 2015-07-01 LAB — CBC
HEMATOCRIT: 28.4 % — AB (ref 39.0–52.0)
Hemoglobin: 8.8 g/dL — ABNORMAL LOW (ref 13.0–17.0)
MCH: 23.8 pg — ABNORMAL LOW (ref 26.0–34.0)
MCHC: 31 g/dL (ref 30.0–36.0)
MCV: 76.8 fL — ABNORMAL LOW (ref 78.0–100.0)
PLATELETS: 258 10*3/uL (ref 150–400)
RBC: 3.7 MIL/uL — ABNORMAL LOW (ref 4.22–5.81)
RDW: 17.2 % — AB (ref 11.5–15.5)
WBC: 9.1 10*3/uL (ref 4.0–10.5)

## 2015-07-01 LAB — TSH: TSH: 1.199 u[IU]/mL (ref 0.350–4.500)

## 2015-07-01 LAB — ECHOCARDIOGRAM COMPLETE
HEIGHTINCHES: 67 in
Weight: 2860.8 oz

## 2015-07-01 LAB — HEPARIN LEVEL (UNFRACTIONATED): HEPARIN UNFRACTIONATED: 0.31 [IU]/mL (ref 0.30–0.70)

## 2015-07-01 MED ORDER — METOPROLOL SUCCINATE ER 25 MG PO TB24
12.5000 mg | ORAL_TABLET | Freq: Every day | ORAL | Status: DC
  Administered 2015-07-01: 12.5 mg via ORAL
  Filled 2015-07-01 (×2): qty 1

## 2015-07-01 MED ORDER — CALCIUM CARBONATE ANTACID 500 MG PO CHEW
1.0000 | CHEWABLE_TABLET | Freq: Every day | ORAL | Status: DC
  Administered 2015-07-01 – 2015-07-06 (×6): 200 mg via ORAL
  Filled 2015-07-01 (×6): qty 1

## 2015-07-01 NOTE — Consult Note (Signed)
Referring Provider: Elizabeth Palau, MD Primary Care Physician:  ADDIS,DANIEL, DO Primary Gastroenterologist:  Dr. Karilyn Cota  Reason for Consultation:    Iron deficiency anemia and history of GI bleed.  HPI:   Patient is 82 year old Caucasian male who was seen in the office on 06/26/2015 with anemia and heme positive stool. He was found to have hemoglobin of 8.1 on 06/19/2015 by his PCP Dr. Hart Robinsons. He was noted to have heme positive stool. When seen in the office he did not have any complaints other than mild fatigue. 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 The site was clipped with hemostasis. Patient's hemoglobin was noted to be 6.7 g. He was therefore admitted as an observation and given 2 units of PRBCs and was discharged the following morning. Posttransfusion hemoglobin was 9.3. Over the weekend patient began to have syncopal episodes and he apparently started feel sleep. He did not experience nausea vomiting hematemesis abdominal pain or melena. He has not had a bowel movement since the time of EGD. Patient was brought to emergency room yesterday by family members. Workup revealed heme-negative stool and he had DVT involving the right popliteal vein. VQ scan was low probability for PE. Carotid Doppler revealed less than 50% stenosis involving the right ICA and 50-69% involving left ICA. Unenhanced head CT was negative for acute abnormality. Patient was hospitalized and begun on IV heparin. He is also been evaluated by cardiology service and undergoing evaluation. Mr. Carolan Shiver level has been mildly elevated. He has not experienced hematemesis melena or rectal bleeding today. He states he has good appetite. He has not had syncopal episodes since admission.     Past Medical History  Diagnosis Date  . Anemia   . Coronary artery disease     Past Surgical History  Procedure Laterality Date  . Cardiac valve surgery  2009  .  Trigger finger release Left   . Leg surgery    . Colonoscopy  2015    Dr.Spainhour  . Upper gastrointestinal endoscopy  2016    Dr.Spainhour  . Appendectomy    . Eye surgery Left   . Coronary artery bypass graft      Prior to Admission medications   Medication Sig Start Date End Date Taking? Authorizing Provider  aspirin EC 81 MG tablet Take 1 tablet (81 mg total) by mouth daily. 07/01/15  Yes Malissa Hippo, MD  atorvastatin (LIPITOR) 80 MG tablet Take 80 mg by mouth daily.   Yes Historical Provider, MD  BIOTIN PO Take 1 tablet by mouth daily.   Yes Historical Provider, MD  calcium gluconate 500 MG tablet Take 1 tablet by mouth daily.   Yes Historical Provider, MD  Cholecalciferol (VITAMIN D) 2000 units CAPS Take 1 capsule by mouth daily.    Yes Historical Provider, MD  Glucosamine-Chondroit-Vit C-Mn (GLUCOSAMINE 1500 COMPLEX) CAPS Take 1 capsule by mouth daily.    Yes Historical Provider, MD  Lactobacillus (BIOTINEX PO) Take 10,000 mcg by mouth daily.   Yes Historical Provider, MD  metoprolol succinate (TOPROL-XL) 50 MG 24 hr tablet Take 50 mg by mouth daily. Take with or immediately following a meal.   Yes Historical Provider, MD  METRONIDAZOLE, TOPICAL, (METROLOTION) 0.75 % LOTN Apply topically daily.   Yes Historical Provider, MD  omeprazole (PRILOSEC) 40 MG capsule Take 40 mg by mouth daily.   Yes Historical Provider, MD  predniSONE (DELTASONE) 5 MG tablet Take 5 mg by mouth daily with breakfast.  Yes Historical Provider, MD  vitamin B-12 (CYANOCOBALAMIN) 1000 MCG tablet Take 1,000 mcg by mouth daily.   Yes Historical Provider, MD  vitamin C (ASCORBIC ACID) 500 MG tablet Take 500 mg by mouth daily.   Yes Historical Provider, MD  Vitamins-Lipotropics (LIPO-FLAVONOID PLUS) TABS Take 2 tablets by mouth daily.   Yes Historical Provider, MD  Zinc 25 MG TABS Take 1 tablet by mouth daily.    Yes Historical Provider, MD  clindamycin (CLEOCIN) 300 MG capsule Take 300 mg by mouth See admin  instructions. Patient will takes 2 by mouth prior to dental treatment.    Historical Provider, MD    Current Facility-Administered Medications  Medication Dose Route Frequency Provider Last Rate Last Dose  . acetaminophen (TYLENOL) tablet 650 mg  650 mg Oral Q6H PRN Delano Metz, MD       Or  . acetaminophen (TYLENOL) suppository 650 mg  650 mg Rectal Q6H PRN Delano Metz, MD      . aspirin EC tablet 81 mg  81 mg Oral Daily Delano Metz, MD   81 mg at 07/01/15 0859  . atorvastatin (LIPITOR) tablet 80 mg  80 mg Oral Daily Delano Metz, MD   80 mg at 07/01/15 0859  . calcium gluconate tablet 500 mg  1 tablet Oral Daily Delano Metz, MD      . cholecalciferol (VITAMIN D) tablet 2,000 Units  2,000 Units Oral Daily Delano Metz, MD   2,000 Units at 07/01/15 0858  . heparin ADULT infusion 100 units/mL (25000 units/250 mL)  1,100 Units/hr Intravenous Continuous Delano Metz, MD 11 mL/hr at 06/30/15 1939 1,100 Units/hr at 06/30/15 1939  . pantoprazole (PROTONIX) EC tablet 40 mg  40 mg Oral Daily Delano Metz, MD   40 mg at 07/01/15 0859  . polyethylene glycol (MIRALAX / GLYCOLAX) packet 17 g  17 g Oral Daily PRN Delano Metz, MD      . predniSONE (DELTASONE) tablet 5 mg  5 mg Oral Q breakfast Delano Metz, MD   5 mg at 07/01/15 0859  . sodium chloride flush (NS) 0.9 % injection 3 mL  3 mL Intravenous Q12H Delano Metz, MD   0 mL at 07/01/15 0000  . vitamin B-12 (CYANOCOBALAMIN) tablet 1,000 mcg  1,000 mcg Oral Daily Delano Metz, MD   1,000 mcg at 07/01/15 0859  . vitamin C (ASCORBIC ACID) tablet 500 mg  500 mg Oral Daily Delano Metz, MD   500 mg at 07/01/15 0900    Allergies as of 06/30/2015 - Review Complete 06/30/2015  Allergen Reaction Noted  . Penicillins Itching 06/26/2015  . Plaquenil [hydroxychloroquine sulfate] Itching and Other (See Comments) 06/26/2015    Family History  Problem Relation Age of Onset  . Dementia Mother   . Heart disease Father   . Healthy  Daughter   . Bipolar disorder Son     Social History   Social History  . Marital Status: Married    Spouse Name: N/A  . Number of Children: N/A  . Years of Education: N/A   Occupational History  . Not on file.   Social History Main Topics  . Smoking status: Former Smoker    Quit date: 06/26/1975  . Smokeless tobacco: Never Used  . Alcohol Use: No     Comment: Patient quit 40 years ago.  . Drug Use: Not on file  . Sexual Activity: Not on file   Other Topics Concern  . Not on file   Social History Narrative  Review of Systems: See HPI, otherwise normal ROS  Physical Exam: Temp:  [98.2 F (36.8 C)] 98.2 F (36.8 C) (03/28 0500) Pulse Rate:  [80-105] 105 (03/28 0500) Resp:  [13-23] 16 (03/28 0500) BP: (100-131)/(64-88) 125/67 mmHg (03/28 0500) SpO2:  [92 %-97 %] 93 % (03/28 0500) FiO2 (%):  [28 %] 28 % (03/27 2357) Weight:  [178 lb 12.8 oz (81.103 kg)-180 lb 11.2 oz (81.965 kg)] 178 lb 12.8 oz (81.103 kg) (03/28 0500)   Patient is alert and in no acute distress. Conjunctiva is pink. Sclerae nonicteric. Oropharyngeal mucosa is normal. No neck masses or thyromegaly noted. Artifact exam with regular rhythm normal S1 and loud S2. Grade 2/6 systolic ejection murmur best heard at aortic area. Lungs are clear to auscultation. Abdomen is full but soft and nontender without organomegaly or masses. 1+ pitting edema noted to right leg area tenderness noted.  Lab Results:  Recent Labs  06/30/15 1134 07/01/15 0327  WBC 9.8 9.1  HGB 9.0* 8.8*  HCT 29.0* 28.4*  PLT 284 258   BMET  Recent Labs  06/30/15 1134  NA 134*  K 4.3  CL 104  CO2 21*  GLUCOSE 132*  BUN 33*  CREATININE 1.40*  CALCIUM 8.8*   PT/INR  Recent Labs  06/30/15 1325  LABPROT 15.6*  INR 1.22    Studies/Results: Dg Chest 2 View  06/30/2015  CLINICAL DATA:  Loss of consciousness at home. EXAM: CHEST  2 VIEW COMPARISON:  September 06, 2010. FINDINGS: Stable cardiomediastinal silhouette.  Status post coronary artery bypass graft. Status post aortic valve replacement. No pneumothorax or pleural effusion is noted. No acute pulmonary disease is noted. Bony thorax is unremarkable. IMPRESSION: No active cardiopulmonary disease. Electronically Signed   By: Lupita Raider, M.D.   On: 06/30/2015 14:30   Dg Forearm Left  06/30/2015  CLINICAL DATA:  Recent fall with left forearm bruising, initial encounter EXAM: LEFT FOREARM - 2 VIEW COMPARISON:  None. FINDINGS: No acute fracture or dislocation is noted. Degenerative changes are noted about the wrist joint. No gross soft tissue abnormality is seen. IMPRESSION: No acute abnormality noted. Electronically Signed   By: Alcide Clever M.D.   On: 06/30/2015 14:33   Ct Head Wo Contrast  06/30/2015  CLINICAL DATA:  Recent episode of loss of consciousness. EXAM: CT HEAD WITHOUT CONTRAST TECHNIQUE: Contiguous axial images were obtained from the base of the skull through the vertex without intravenous contrast. COMPARISON:  None. FINDINGS: There is moderate diffuse atrophy with ventricles in proportion relatively larger than the sulci. There is no intracranial mass, hemorrhage, extra-axial fluid collection, or midline shift. There is mild patchy small vessel disease in the centra semiovale bilaterally. There is asymmetric decreased attenuation in the anterior right corona radiata, consistent with focal age uncertain white matter infarct. Elsewhere gray-white compartments appear normal. The bony calvarium appears intact. The mastoid air cells are clear. No intraorbital lesions are apparent. 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 Elsewhere there is atrophy with mild periventricular small vessel disease. No hemorrhage or mass effect. Note that the ventricles in proportion are slightly larger than the sulci. Question early normal pressure hydrocephalus superimposed on atrophy. Electronically Signed   By: Bretta Bang III M.D.   On: 06/30/2015 13:55   US Carotid Bilateral  06/30/2015  CLINICAL DATA:  Syncope versus narcolepsy. Hypertension, hyperlipidemia, previous tobacco abuse, coronary disease EXAM: BILATERAL CAROTID DUPLEX ULTRASOUND TECHNIQUE: Wallace Cullens scale imaging, color Doppler and duplex  ultrasound was performed of bilateral carotid and vertebral arteries in the neck. COMPARISON:  None. REVIEW OF SYSTEMS: Quantification of carotid stenosis is based on velocity parameters that correlate the residual internal carotid diameter with NASCET-based stenosis levels, using the diameter of the distal internal carotid lumen as the denominator for stenosis measurement. The following velocity measurements were obtained: PEAK SYSTOLIC/END DIASTOLIC RIGHT ICA:                     96/38cm/sec CCA:                     90/27cm/sec SYSTOLIC ICA/CCA RATIO:  1.1 DIASTOLIC ICA/CCA RATIO: 1.6 ECA:                     50cm/sec LEFT ICA:                     210/68cm/sec CCA:                     100/24cm/sec SYSTOLIC ICA/CCA RATIO:  2.3 DIASTOLIC ICA/CCA RATIO: 2.6 ECA:                     231cm/sec FINDINGS: RIGHT CAROTID ARTERY: Eccentric partially calcified nonocclusive plaque in the mid and distal common carotid artery and bulb extending to the origin of the ICA. No high-grade stenosis. Normal waveforms and color Doppler signal. RIGHT VERTEBRAL ARTERY:  Normal flow direction and waveform. LEFT CAROTID ARTERY: Eccentric nonocclusive plaque in the mid common carotid artery. More extensive circumferential partially calcified plaque in the carotid bulb extending into proximal internal and external carotid arteries resulting in at least mild stenosis. There are elevated peak systolic velocities in the proximal external carotid artery and mid ICA distal to the plaque. LEFT VERTEBRAL ARTERY: Normal flow direction and waveform. IMPRESSION: 1. Bilateral carotid bifurcation and proximal ICA plaque, resulting in less than 50% diameter stenosis  on the right, 50-69% diameter stenosis on the left. The exam does not exclude plaque ulceration or embolization. Continued surveillance recommended. Electronically Signed   By: Corlis Leak M.D.   On: 06/30/2015 17:37   Nm Pulmonary Perf And Vent  06/30/2015  CLINICAL DATA:  Syncopal events. EXAM: NUCLEAR MEDICINE VENTILATION - PERFUSION LUNG SCAN TECHNIQUE: Ventilation images were obtained in multiple projections using inhaled aerosol Tc-62m DTPA. Perfusion images were obtained in multiple projections after intravenous injection of Tc-44m MAA. RADIOPHARMACEUTICALS:  33 mCi Technetium-80m DTPA aerosol inhalation and 3.3 mCi Technetium-74m MAA IV COMPARISON:  Chest radiograph from earlier today. FINDINGS: Ventilation: Mildly heterogeneous ventilation throughout both lungs, suggesting airways disease. Perfusion: No unmatched perfusion defects. There is a single moderate matched perfusion defect in the right upper lobe. IMPRESSION: Low probability for pulmonary embolism (10-19%). Electronically Signed   By: Delbert Phenix M.D.   On: 06/30/2015 19:02   US Venous Img Lower Bilateral  06/30/2015  CLINICAL DATA:  Bilateral lower extremity edema x8 months, right worse than left. EXAM: Bilateral LOWER EXTREMITY VENOUS DOPPLER ULTRASOUND TECHNIQUE: Gray-scale sonography with compression, as well as color and duplex ultrasound, were performed to evaluate the deep venous system from the level of the common femoral vein through the popliteal and proximal calf veins. COMPARISON:  None FINDINGS: On the right, Normal compressibility of the common femoral, superficial femoral, and popliteal veins. There is a distended thrombosed peroneal vein which is noncompressible with no flow signal on color Doppler. On the left, Normal compressibility of the  common femoral, superficial femoral, and popliteal veins, as well as the proximal calf veins. No filling defects to suggest DVT on grayscale or color Doppler imaging. Doppler waveforms  show normal direction of venous flow, normal respiratory phasicity and response to augmentation. IMPRESSION: 1. POSITIVE for isolated right peroneal vein (calf) DVT. 2. No left lower extremity DVT. These results will be called to the ordering clinician or representative by the Radiologist Assistant, and communication documented in the PACS or zVision Dashboard. Electronically Signed   By: Corlis Leak M.D.   On: 06/30/2015 17:33    Assessment;  #1. Iron deficiency anemia secondary to chronic GI bleed recently documented to be actively bleeding from post bulbar duodenal AV malformation(06/27/2015). Bleeding site was clipped with hemostasis. Status post transfusion 2 units of PRBCs on 06/27/2015. It is very reassuring to know that his stool is guaiac negative and he is maintaining his hemoglobin. If there is evidence of recurrent GI bleed he will need follow-up EGD. Patient is back on low-dose aspirin. #2. Right popliteal vein DVT. Patient is on heparin infusion without evidence of recurrent or active bleeding. #3. Syncope possibly secondary to arrhythmia. I do not believe syncope secondary to anemia as he was asymptomatic with hemoglobin of 6.7 g. His pain evaluated by Dr. Purvis Sheffield  of cardiology service. #4. Mildly elevated troponin level in a patient with known CAD and prior CABG. ECHO is pending.  Recommendations;  Daily CBC while hospitalized. Continue heparin infusion for 48 hours. He could be transitioned to other agents if no evidence of active bleed in 48 hours. Will consider repeat EGD if there is evidence of recurrent bleed.   LOS: 1 day   Jossalyn Forgione U  07/01/2015, 2:42 PM

## 2015-07-01 NOTE — Progress Notes (Signed)
ANTICOAGULATION CONSULT NOTE -   Pharmacy Consult for Heparin Indication: DVT  Allergies  Allergen Reactions  . Penicillins Itching    Has patient had a PCN reaction causing immediate rash, facial/tongue/throat swelling, SOB or lightheadedness with hypotension: Yes Has patient had a PCN reaction causing severe rash involving mucus membranes or skin necrosis: No Has patient had a PCN reaction that required hospitalization No Has patient had a PCN reaction occurring within the last 10 years: No If all of the above answers are "NO", then may proceed with Cephalosporin use.    . Plaquenil [Hydroxychloroquine Sulfate] Itching and Other (See Comments)    redness   Patient Measurements: Height: 5\' 7"  (170.2 cm) Weight: 178 lb 12.8 oz (81.103 kg) IBW/kg (Calculated) : 66.1 HEPARIN DW (KG): 82   Vital Signs: Temp: 98.2 F (36.8 C) (03/28 0500) Temp Source: Oral (03/28 0500) BP: 125/67 mmHg (03/28 0500) Pulse Rate: 105 (03/28 0500)  Labs:  Recent Labs  06/30/15 1134 06/30/15 1325 07/01/15 0327  HGB 9.0*  --  8.8*  HCT 29.0*  --  28.4*  PLT 284  --  258  LABPROT  --  15.6*  --   INR  --  1.22  --   HEPARINUNFRC  --   --  0.31  CREATININE 1.40*  --   --   TROPONINI  --  0.33* 0.25*   Estimated Creatinine Clearance: 41.5 mL/min (by C-G formula based on Cr of 1.4).  Medical History: Past Medical History  Diagnosis Date  . Anemia   . Coronary artery disease    Medications:  Prescriptions prior to admission  Medication Sig Dispense Refill Last Dose  . aspirin EC 81 MG tablet Take 1 tablet (81 mg total) by mouth daily.   06/30/2015 at Unknown time  . atorvastatin (LIPITOR) 80 MG tablet Take 80 mg by mouth daily.   06/30/2015 at Unknown time  . BIOTIN PO Take 1 tablet by mouth daily.   06/30/2015 at Unknown time  . calcium gluconate 500 MG tablet Take 1 tablet by mouth daily.   06/30/2015 at Unknown time  . Cholecalciferol (VITAMIN D) 2000 units CAPS Take 1 capsule by mouth  daily.    06/30/2015 at Unknown time  . Glucosamine-Chondroit-Vit C-Mn (GLUCOSAMINE 1500 COMPLEX) CAPS Take 1 capsule by mouth daily.    06/30/2015 at Unknown time  . Lactobacillus (BIOTINEX PO) Take 10,000 mcg by mouth daily.   06/30/2015 at Unknown time  . metoprolol succinate (TOPROL-XL) 50 MG 24 hr tablet Take 50 mg by mouth daily. Take with or immediately following a meal.   06/30/2015 at Unknown time  . METRONIDAZOLE, TOPICAL, (METROLOTION) 0.75 % LOTN Apply topically daily.   06/30/2015 at Unknown time  . omeprazole (PRILOSEC) 40 MG capsule Take 40 mg by mouth daily.   06/30/2015 at Unknown time  . predniSONE (DELTASONE) 5 MG tablet Take 5 mg by mouth daily with breakfast.   06/30/2015 at Unknown time  . vitamin B-12 (CYANOCOBALAMIN) 1000 MCG tablet Take 1,000 mcg by mouth daily.   06/30/2015 at Unknown time  . vitamin C (ASCORBIC ACID) 500 MG tablet Take 500 mg by mouth daily.   06/30/2015 at Unknown time  . Vitamins-Lipotropics (LIPO-FLAVONOID PLUS) TABS Take 2 tablets by mouth daily.   06/30/2015 at Unknown time  . Zinc 25 MG TABS Take 1 tablet by mouth daily.    06/30/2015 at Unknown time  . clindamycin (CLEOCIN) 300 MG capsule Take 300 mg by mouth See admin instructions.  Patient will takes 2 by mouth prior to dental treatment.   unknown   Home meds reviewed.  Assessment: Okay for Protocol, recently admitted with anemia and AVMs. He underwent endoscopy and clipping of an AVM by Dr Renae Fickle.  New DVT diagnoses today. Hg 9.0. Baseline anti-coag labs noted (slightly elevated PT).  Home Plavix stopped during previous admission.  Heparin level is therapeutic.  Goal of Therapy:  Heparin level 0.3-0.7 units/ml Monitor platelets by anticoagulation protocol: Yes   Plan:   Will dose conservatively due to recent GI Issues.  Continue Heparin at 1100 units/hr  Check heparin level and CBC daily while on Heparin  Margo Aye, Afnan Cadiente A 07/01/2015,10:22 AM

## 2015-07-01 NOTE — Progress Notes (Signed)
Subjective:  Daughter thinks he's short of breath but patient doesn't complain of it or any cardiac complaints  Objective:  Vital Signs in the last 24 hours: Temp:  [98.2 F (36.8 C)] 98.2 F (36.8 C) (03/28 0500) Pulse Rate:  [80-105] 105 (03/28 0500) Resp:  [11-23] 16 (03/28 0500) BP: (100-131)/(64-88) 125/67 mmHg (03/28 0500) SpO2:  [90 %-97 %] 93 % (03/28 0500) FiO2 (%):  [28 %] 28 % (03/27 2357) Weight:  [178 lb 12.8 oz (81.103 kg)-183 lb (83.008 kg)] 178 lb 12.8 oz (81.103 kg) (03/28 0500)  Intake/Output from previous day: 03/27 0701 - 03/28 0700 In: -  Out: 500 [Urine:500] Intake/Output from this shift: Total I/O In: 240 [P.O.:240] Out: -   Physical Exam: NECK: Without JVD, HJR, or bruit LUNGS: Decreased breath sounds but Clear anterior, posterior, lateral HEART: Regular rate and rhythm, 2/6 systolic murmur LSB, no gallop, rub, bruit, thrill, or heave EXTREMITIES: Without cyanosis, clubbing, or edema   Lab Results:  Recent Labs  06/30/15 1134 07/01/15 0327  WBC 9.8 9.1  HGB 9.0* 8.8*  PLT 284 258    Recent Labs  06/30/15 1134  NA 134*  K 4.3  CL 104  CO2 21*  GLUCOSE 132*  BUN 33*  CREATININE 1.40*    Recent Labs  06/30/15 1325 07/01/15 0327  TROPONINI 0.33* 0.25*   Hepatic Function Panel No results for input(s): PROT, ALBUMIN, AST, ALT, ALKPHOS, BILITOT, BILIDIR, IBILI in the last 72 hours. No results for input(s): CHOL in the last 72 hours. No results for input(s): PROTIME in the last 72 hours.   Cardiac Studies: Telemetry: Sinus tachycardia at 101/m  Assesment/Plan:  Syncope with mildly elevated troponins 0.33, 0.25 awaiting echo  Sinus tachycardia: was on Toprol XL 50 mg as outpatient-being held for ? Narcolepsy. Consider resuming at lower dose?  Right peroneal DVT on IV heparin. V/Q low probability for PE  S/P porcine AVR/CABG 2009 EF 39% in 2009  GI bleed duodenal AVM clipped 06/2015  History of CEA       LOS: 1 day     Jacolyn Reedy 07/01/2015, 10:18 AM   The patient was seen and examined, and I agree with the physical exam, assessment and plan as documented above, with modifications as noted below. Troponin elevation consistent with demand ischemia in the setting of anemia. This appears to have been more consistent with narcolepsy rather than a primary cardiac event. Will obtain echocardiogram. Continue present medical therapy with resumption of low dose beta blocker given resting tachycardia.  Prentice Docker, MD, [facility]  07/01/2015 11:11 AM

## 2015-07-01 NOTE — Telephone Encounter (Signed)
Per Dr Karilyn Cota, hold off on EGD at this time, patient is inpatient at [facility]

## 2015-07-01 NOTE — Progress Notes (Signed)
[facility] PROGRESS NOTE  Josey Forcier VXB:[id number] DOB: 04-Nov-1931 DOA: 06/30/2015 PCP: ADDIS,DANIEL, DO  Assessment/Plan: Increased lethargy -Etiology remains unclear although admitting doctor suspected his beta blocker may be playing a role and this has been discontinued. -He had a recent GI bleed but his hemoglobin remains stable, he does have severe systolic CHF with known ejection fraction of 20%. -He seems improved per wife's report. -Continue to observe.  Acute lower extremity DVT -Anticoagulation situation is tricky given his recent GI bleed.  -He has been started on a low bolus dose IV heparin. -Will request GI consultation for assistance in helping decide long term anticoagulation given his recent GI bleed. -VQ scan shows low probability for PE.  Elevated troponin -Likely represents demand ischemia. -EKG does not have acute ischemic abnormalities. -Troponins are decreasing.  Code Status: Full code Family Communication: Wife at bedside updated on plan of care and all questions answered  Disposition Plan: Likely home in 24-48 hours   Consultants:  Cardiology   Antibiotics:  None   Subjective: Feels well, no complaints  Objective: Filed Vitals:   06/30/15 2053 06/30/15 2357 07/01/15 0500 07/01/15 1554  BP: 117/73  125/67 115/80  Pulse: 96  105 107  Temp: 98.2 F (36.8 C)  98.2 F (36.8 C) 98.1 F (36.7 C)  TempSrc: Oral  Oral Oral  Resp: 16  16 16   Height: 5\' 7"  (1.702 m)     Weight: 81.965 kg (180 lb 11.2 oz)  81.103 kg (178 lb 12.8 oz)   SpO2: 96% 96% 93% 92%    Intake/Output Summary (Last 24 hours) at 07/01/15 1724 Last data filed at 07/01/15 1333  Gross per 24 hour  Intake    480 ml  Output    500 ml  Net    -20 ml   Filed Weights   06/30/15 1120 06/30/15 2053 07/01/15 0500  Weight: 83.008 kg (183 lb) 81.965 kg (180 lb 11.2 oz) 81.103 kg (178 lb 12.8 oz)    Exam:   General:  Alert, awake, oriented 3  Cardiovascular:  Regular rate and rhythm  Respiratory: Clear to auscultation bilaterally  Abdomen: Soft, nontender, nondistended  Extremities: 2+ pitting edema of right calf, no edema of the left   Neurologic:  Grossly intact and nonfocal  Data Reviewed: Basic Metabolic Panel:  Recent Labs Lab 06/30/15 1134  NA 134*  K 4.3  CL 104  CO2 21*  GLUCOSE 132*  BUN 33*  CREATININE 1.40*  CALCIUM 8.8*   Liver Function Tests: No results for input(s): AST, ALT, ALKPHOS, BILITOT, PROT, ALBUMIN in the last 168 hours. No results for input(s): LIPASE, AMYLASE in the last 168 hours. No results for input(s): AMMONIA in the last 168 hours. CBC:  Recent Labs Lab 06/27/15 1425 06/28/15 0430 06/30/15 1134 07/01/15 0327  WBC  --   --  9.8 9.1  HGB 6.7* 9.3* 9.0* 8.8*  HCT 22.5* 29.5* 29.0* 28.4*  MCV  --   --  76.5* 76.8*  PLT  --   --  284 258   Cardiac Enzymes:  Recent Labs Lab 06/30/15 1325 07/01/15 0327  TROPONINI 0.33* 0.25*   BNP (last 3 results) No results for input(s): BNP in the last 8760 hours.  ProBNP (last 3 results) No results for input(s): PROBNP in the last 8760 hours.  CBG: No results for input(s): GLUCAP in the last 168 hours.  No results found for this or any previous visit (from the past 240 hour(s)).  Studies: Dg Chest 2 View  06/30/2015  CLINICAL DATA:  Loss of consciousness at home. EXAM: CHEST  2 VIEW COMPARISON:  September 06, 2010. FINDINGS: Stable cardiomediastinal silhouette. Status post coronary artery bypass graft. Status post aortic valve replacement. No pneumothorax or pleural effusion is noted. No acute pulmonary disease is noted. Bony thorax is unremarkable. IMPRESSION: No active cardiopulmonary disease. Electronically Signed   By: Lupita Raider, M.D.   On: 06/30/2015 14:30   Dg Forearm Left  06/30/2015  CLINICAL DATA:  Recent fall with left forearm bruising, initial encounter EXAM: LEFT FOREARM - 2 VIEW COMPARISON:  None. FINDINGS: No acute fracture or  dislocation is noted. Degenerative changes are noted about the wrist joint. No gross soft tissue abnormality is seen. IMPRESSION: No acute abnormality noted. Electronically Signed   By: Alcide Clever M.D.   On: 06/30/2015 14:33   Ct Head Wo Contrast  06/30/2015  CLINICAL DATA:  Recent episode of loss of consciousness. EXAM: CT HEAD WITHOUT CONTRAST TECHNIQUE: Contiguous axial images were obtained from the base of the skull through the vertex without intravenous contrast. COMPARISON:  None. FINDINGS: There is moderate diffuse atrophy with ventricles in proportion relatively larger than the sulci. There is no intracranial mass, hemorrhage, extra-axial fluid collection, or midline shift. There is mild patchy small vessel disease in the centra semiovale bilaterally. There is asymmetric decreased attenuation in the anterior right corona radiata, consistent with focal age uncertain white matter infarct. Elsewhere gray-white compartments appear normal. The bony calvarium appears intact. The mastoid air cells are clear. No intraorbital lesions are apparent. 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 Elsewhere there is atrophy with mild periventricular small vessel disease. No hemorrhage or mass effect. Note that the ventricles in proportion are slightly larger than the sulci. Question early normal pressure hydrocephalus superimposed on atrophy. Electronically Signed   By: Bretta Bang III M.D.   On: 06/30/2015 13:55   US Carotid Bilateral  06/30/2015  CLINICAL DATA:  Syncope versus narcolepsy. Hypertension, hyperlipidemia, previous tobacco abuse, coronary disease EXAM: BILATERAL CAROTID DUPLEX ULTRASOUND TECHNIQUE: Wallace Cullens scale imaging, color Doppler and duplex ultrasound was performed of bilateral carotid and vertebral arteries in the neck. COMPARISON:  None. REVIEW OF SYSTEMS: Quantification of carotid stenosis is based on velocity parameters that correlate the  residual internal carotid diameter with NASCET-based stenosis levels, using the diameter of the distal internal carotid lumen as the denominator for stenosis measurement. The following velocity measurements were obtained: PEAK SYSTOLIC/END DIASTOLIC RIGHT ICA:                     96/38cm/sec CCA:                     90/27cm/sec SYSTOLIC ICA/CCA RATIO:  1.1 DIASTOLIC ICA/CCA RATIO: 1.6 ECA:                     50cm/sec LEFT ICA:                     210/68cm/sec CCA:                     100/24cm/sec SYSTOLIC ICA/CCA RATIO:  2.3 DIASTOLIC ICA/CCA RATIO: 2.6 ECA:                     231cm/sec FINDINGS: RIGHT CAROTID ARTERY: Eccentric partially calcified nonocclusive plaque in the mid and distal common carotid  artery and bulb extending to the origin of the ICA. No high-grade stenosis. Normal waveforms and color Doppler signal. RIGHT VERTEBRAL ARTERY:  Normal flow direction and waveform. LEFT CAROTID ARTERY: Eccentric nonocclusive plaque in the mid common carotid artery. More extensive circumferential partially calcified plaque in the carotid bulb extending into proximal internal and external carotid arteries resulting in at least mild stenosis. There are elevated peak systolic velocities in the proximal external carotid artery and mid ICA distal to the plaque. LEFT VERTEBRAL ARTERY: Normal flow direction and waveform. IMPRESSION: 1. Bilateral carotid bifurcation and proximal ICA plaque, resulting in less than 50% diameter stenosis on the right, 50-69% diameter stenosis on the left. The exam does not exclude plaque ulceration or embolization. Continued surveillance recommended. Electronically Signed   By: Corlis Leak M.D.   On: 06/30/2015 17:37   Nm Pulmonary Perf And Vent  06/30/2015  CLINICAL DATA:  Syncopal events. EXAM: NUCLEAR MEDICINE VENTILATION - PERFUSION LUNG SCAN TECHNIQUE: Ventilation images were obtained in multiple projections using inhaled aerosol Tc-71m DTPA. Perfusion images were obtained in multiple  projections after intravenous injection of Tc-82m MAA. RADIOPHARMACEUTICALS:  33 mCi Technetium-29m DTPA aerosol inhalation and 3.3 mCi Technetium-44m MAA IV COMPARISON:  Chest radiograph from earlier today. FINDINGS: Ventilation: Mildly heterogeneous ventilation throughout both lungs, suggesting airways disease. Perfusion: No unmatched perfusion defects. There is a single moderate matched perfusion defect in the right upper lobe. IMPRESSION: Low probability for pulmonary embolism (10-19%). Electronically Signed   By: Delbert Phenix M.D.   On: 06/30/2015 19:02   US Venous Img Lower Bilateral  06/30/2015  CLINICAL DATA:  Bilateral lower extremity edema x8 months, right worse than left. EXAM: Bilateral LOWER EXTREMITY VENOUS DOPPLER ULTRASOUND TECHNIQUE: Gray-scale sonography with compression, as well as color and duplex ultrasound, were performed to evaluate the deep venous system from the level of the common femoral vein through the popliteal and proximal calf veins. COMPARISON:  None FINDINGS: On the right, Normal compressibility of the common femoral, superficial femoral, and popliteal veins. There is a distended thrombosed peroneal vein which is noncompressible with no flow signal on color Doppler. On the left, Normal compressibility of the common femoral, superficial femoral, and popliteal veins, as well as the proximal calf veins. No filling defects to suggest DVT on grayscale or color Doppler imaging. Doppler waveforms show normal direction of venous flow, normal respiratory phasicity and response to augmentation. IMPRESSION: 1. POSITIVE for isolated right peroneal vein (calf) DVT. 2. No left lower extremity DVT. These results will be called to the ordering clinician or representative by the Radiologist Assistant, and communication documented in the PACS or zVision Dashboard. Electronically Signed   By: Corlis Leak M.D.   On: 06/30/2015 17:33    Scheduled Meds: . aspirin EC  81 mg Oral Daily  .  atorvastatin  80 mg Oral Daily  . calcium gluconate  1 tablet Oral Daily  . cholecalciferol  2,000 Units Oral Daily  . metoprolol succinate  12.5 mg Oral Daily  . pantoprazole  40 mg Oral Daily  . predniSONE  5 mg Oral Q breakfast  . sodium chloride flush  3 mL Intravenous Q12H  . vitamin B-12  1,000 mcg Oral Daily  . vitamin C  500 mg Oral Daily   Continuous Infusions: . 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 Greater than 50% of this time was spent in direct contact with the patient coordinating care.    Chaya Jan  [facility] Pager [phone]  If 7PM-7AM, please contact night-coverage at [email or web address], password [facility] 07/01/2015, 5:24 PM  LOS: 1 day

## 2015-07-02 DIAGNOSIS — R7989 Other specified abnormal findings of blood chemistry: Secondary | ICD-10-CM

## 2015-07-02 DIAGNOSIS — I97110 Postprocedural cardiac insufficiency following cardiac surgery: Secondary | ICD-10-CM

## 2015-07-02 LAB — CBC
HEMATOCRIT: 31.1 % — AB (ref 39.0–52.0)
HEMOGLOBIN: 9.5 g/dL — AB (ref 13.0–17.0)
MCH: 23.4 pg — ABNORMAL LOW (ref 26.0–34.0)
MCHC: 30.5 g/dL (ref 30.0–36.0)
MCV: 76.6 fL — AB (ref 78.0–100.0)
Platelets: 339 10*3/uL (ref 150–400)
RBC: 4.06 MIL/uL — ABNORMAL LOW (ref 4.22–5.81)
RDW: 18 % — ABNORMAL HIGH (ref 11.5–15.5)
WBC: 10.6 10*3/uL — ABNORMAL HIGH (ref 4.0–10.5)

## 2015-07-02 LAB — HEPARIN LEVEL (UNFRACTIONATED): HEPARIN UNFRACTIONATED: 0.36 [IU]/mL (ref 0.30–0.70)

## 2015-07-02 MED ORDER — DILTIAZEM HCL ER COATED BEADS 120 MG PO CP24: 120.0000 mg | ORAL_CAPSULE | Freq: Every day | ORAL | Status: DC

## 2015-07-02 MED ORDER — METOPROLOL SUCCINATE ER 25 MG PO TB24
25.0000 mg | ORAL_TABLET | Freq: Every day | ORAL | Status: DC
  Administered 2015-07-02 – 2015-07-06 (×5): 25 mg via ORAL
  Filled 2015-07-02 (×4): qty 1

## 2015-07-02 NOTE — Progress Notes (Signed)
Patient's monitor showed a burst of SVT with HR in the 150's, then immediately back to ST with HR in the 110's, asymptomatic at the time, will continue to monitor, strip saved per CCMD.

## 2015-07-02 NOTE — Progress Notes (Signed)
  Subjective:  Patient has no complaints. He denies chest or abdominal pain. He does admit that he gets short of breath when he walks to the bathroom. He had 2 formed stools daily. No melena or rectal bleeding reported.  Objective: Blood pressure 119/74, pulse 112, temperature 98.4 F (36.9 C), temperature source Oral, resp. rate 16, height 5\' 7"  (1.702 m), weight 178 lb 12.8 oz (81.102 kg), SpO2 91 %. Patient is alert. He appears to be mildly dyspneic. Conjunctiva is pink. Sclera is nonicteric Oropharyngeal mucosa is normal. No neck masses or thyromegaly noted. Cardiac exam with regular rhythm normal S1 and loudS2. Grade 2/6 systolic ejection murmur best heard at aortic area. Abdomen soft and nontender. He has 2+ pitting edema to right leg with mild calf tenderness.  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 No evidence of recurrent GI bleed. Patient is on heparin infusion for right DVT. H&H is coming up. #2. History of syncope. No arrhythmia documented on telemetry he remains with sinus tachycardia. #3.  Dyspnea with minimal exertion possibly related to aortic valve disease. Patient's family undecided whether they want to resume cardiac care in 07/02/15 or transfer to [facility]. #4. Right peroneal vein DVT. Patient remains on heparin infusion. He could be transitioned to other anticoagulation as appropriate.   Recommendations:  Will continue to monitor H&H daily.

## 2015-07-02 NOTE — Progress Notes (Signed)
ANTICOAGULATION CONSULT NOTE -   Pharmacy Consult for Heparin Indication: DVT  Allergies  Allergen Reactions  . Penicillins Itching    Has patient had a PCN reaction causing immediate rash, facial/tongue/throat swelling, SOB or lightheadedness with hypotension: Yes Has patient had a PCN reaction causing severe rash involving mucus membranes or skin necrosis: No Has patient had a PCN reaction that required hospitalization No Has patient had a PCN reaction occurring within the last 10 years: No If all of the above answers are "NO", then may proceed with Cephalosporin use.    . Plaquenil [Hydroxychloroquine Sulfate] Itching and Other (See Comments)    redness   Patient Measurements: Height: 5\' 7"  (170.2 cm) Weight: 178 lb 12.8 oz (81.102 kg) IBW/kg (Calculated) : 66.1 HEPARIN DW (KG): 82   Vital Signs: Temp: 98.9 F (37.2 C) (03/29 0500) Temp Source: Oral (03/29 0500) BP: 128/71 mmHg (03/29 0500) Pulse Rate: 95 (03/29 0500)  Labs:  Recent Labs  06/30/15 1134 06/30/15 1325 07/01/15 0327 07/02/15 0632  HGB 9.0*  --  8.8* 9.5*  HCT 29.0*  --  28.4* 31.1*  PLT 284  --  258 339  LABPROT  --  15.6*  --   --   INR  --  1.22  --   --   HEPARINUNFRC  --   --  0.31 0.36  CREATININE 1.40*  --   --   --   TROPONINI  --  0.33* 0.25*  --    Estimated Creatinine Clearance: 41.5 mL/min (by C-G formula based on Cr of 1.4).  Medical History: Past Medical History  Diagnosis Date  . Anemia   . Coronary artery disease    Medications:  Prescriptions prior to admission  Medication Sig Dispense Refill Last Dose  . aspirin EC 81 MG tablet Take 1 tablet (81 mg total) by mouth daily.   06/30/2015 at Unknown time  . atorvastatin (LIPITOR) 80 MG tablet Take 80 mg by mouth daily.   06/30/2015 at Unknown time  . BIOTIN PO Take 1 tablet by mouth daily.   06/30/2015 at Unknown time  . calcium gluconate 500 MG tablet Take 1 tablet by mouth daily.   06/30/2015 at Unknown time  . Cholecalciferol  (VITAMIN D) 2000 units CAPS Take 1 capsule by mouth daily.    06/30/2015 at Unknown time  . Glucosamine-Chondroit-Vit C-Mn (GLUCOSAMINE 1500 COMPLEX) CAPS Take 1 capsule by mouth daily.    06/30/2015 at Unknown time  . Lactobacillus (BIOTINEX PO) Take 10,000 mcg by mouth daily.   06/30/2015 at Unknown time  . metoprolol succinate (TOPROL-XL) 50 MG 24 hr tablet Take 50 mg by mouth daily. Take with or immediately following a meal.   06/30/2015 at Unknown time  . METRONIDAZOLE, TOPICAL, (METROLOTION) 0.75 % LOTN Apply topically daily.   06/30/2015 at Unknown time  . omeprazole (PRILOSEC) 40 MG capsule Take 40 mg by mouth daily.   06/30/2015 at Unknown time  . predniSONE (DELTASONE) 5 MG tablet Take 5 mg by mouth daily with breakfast.   06/30/2015 at Unknown time  . vitamin B-12 (CYANOCOBALAMIN) 1000 MCG tablet Take 1,000 mcg by mouth daily.   06/30/2015 at Unknown time  . vitamin C (ASCORBIC ACID) 500 MG tablet Take 500 mg by mouth daily.   06/30/2015 at Unknown time  . Vitamins-Lipotropics (LIPO-FLAVONOID PLUS) TABS Take 2 tablets by mouth daily.   06/30/2015 at Unknown time  . Zinc 25 MG TABS Take 1 tablet by mouth daily.    06/30/2015  at Unknown time  . clindamycin (CLEOCIN) 300 MG capsule Take 300 mg by mouth See admin instructions. Patient will takes 2 by mouth prior to dental treatment.   unknown   Home meds reviewed.  Assessment: Okay for Protocol, recently admitted with anemia and AVMs. He underwent endoscopy and clipping of an AVM by Dr Renae Fickle.  New DVT diagnoses today. Hg 9.0. Baseline anti-coag labs noted (slightly elevated PT).  Home Plavix stopped during previous admission.  Heparin level remains therapeutic. Transition to another agent if no bleeding for 48 hours per cardiology note 3/29.   Goal of Therapy:  Heparin level 0.3-0.7 units/ml Monitor platelets by anticoagulation protocol: Yes   Plan:   Will dose conservatively due to recent GI Issues.  Continue Heparin at 1100  units/hr  Check heparin level and CBC daily while on Heparin  Elder Cyphers, BS Pharm D, BCPS Clinical Pharmacist Pager [phone]  07/02/2015,8:52 AM

## 2015-07-02 NOTE — Progress Notes (Signed)
Subjective:  Up in room and heart rate 125. Patient wants to go home  Objective:  Vital Signs in the last 24 hours: Temp:  [98.1 F (36.7 C)-98.9 F (37.2 C)] 98.9 F (37.2 C) (03/29 0500) Pulse Rate:  [95-107] 95 (03/29 0500) Resp:  [16] 16 (03/29 0500) BP: (115-128)/(71-80) 128/71 mmHg (03/29 0500) SpO2:  [91 %-93 %] 91 % (03/29 0500) Weight:  [178 lb 12.8 oz (81.102 kg)] 178 lb 12.8 oz (81.102 kg) (03/29 0500)  Intake/Output from previous day: 03/28 0701 - 03/29 0700 In: 1420 [P.O.:1160; I.V.:260] Out: -  Intake/Output from this shift:    Physical Exam: NECK: Without JVD, HJR, or bruit LUNGS: Clear anterior, posterior, lateral HEART: Regular rate and rhythm at 125, 2/6 systolic murmur, no gallop, rub, bruit, thrill, or heave EXTREMITIES: Without cyanosis, clubbing, or edema  Lab Results:  Recent Labs  07/01/15 0327 07/02/15 0632  WBC 9.1 10.6*  HGB 8.8* 9.5*  PLT 258 339    Recent Labs  06/30/15 1134  NA 134*  K 4.3  CL 104  CO2 21*  GLUCOSE 132*  BUN 33*  CREATININE 1.40*    Recent Labs  06/30/15 1325 07/01/15 0327  TROPONINI 0.33* 0.25*   Hepatic Function Panel No results for input(s): PROT, ALBUMIN, AST, ALT, ALKPHOS, BILITOT, BILIDIR, IBILI in the last 72 hours. No results for input(s): CHOL in the last 72 hours. No results for input(s): PROTIME in the last 72 hours.  I  Cardiac Studies: 2-D echo 07/01/15 Study Conclusions   - Left ventricle: The cavity size was normal. Wall thickness was   increased in a pattern of mild LVH. Systolic function was mildly   to moderately reduced. The estimated ejection fraction was in the   range of 40% to 45%. Diffuse hypokinesis. Doppler parameters are   consistent with abnormal left ventricular relaxation (grade 1   diastolic dysfunction). Doppler parameters are consistent with   high ventricular filling pressure. - Aortic valve: There appears to be bioprosthetic valve stenosis   with reduced leaflet  excursion. Peak velocities and mean   gradients are elevated for this type of bioprosthetic valve. Peak   velocity (S): 359 cm/s. Mean gradient (S): 34 mm Hg. Valve area   (VTI): 0.77 cm^2. Valve area (Vmax): 0.78 cm^2. Valve area   (Vmean): 0.73 cm^2. - Mitral valve: Calcified annulus. Mildly thickened leaflets .   There was mild regurgitation. - Left atrium: The atrium was severely dilated. - Right ventricle: Systolic function was mildly reduced. - Right atrium: The atrium was mildly dilated. - Tricuspid valve: There was mild-moderate regurgitation. - Pulmonary arteries: Systolic pressure was mildly increased. PA   peak pressure: 41 mm Hg (S). - Systemic veins: IVC dilated with normal respiratory variation.   Estimated CVP 8 mmHg.  Assessment/Plan:  Syncope with mildly elevated troponins 0.33, 0.25  2-D echo with EF of 40-45% with diffuse hypokinesis, grade 1 DD, bioprosthetic valve stenosis mean gradient 34 mmHg at least moderate AS  Sinus tachycardia: was on Toprol XL 50 mg as outpatient-being held for ? Narcolepsy. Resume to 12.5 mg yesterday  Will try low dose diltiazem vs increase beta blocker. Discuss with MD   Right peroneal DVT on IV heparin. V/Q low probability for PE. To be transitioned to another agent if no bleeding for 48 hours  S/P porcine AVR/CABG 2009 EF 39% in 2009  GI bleed duodenal AVM clipped 06/2015  History of CEA    LOS: 2 days    Elon Jester  [place] 07/02/2015, 7:58 AM  The patient was seen and examined, and I agree with the physical exam, assessment and plan as documented above which has been discussed with Leda Gauze PA-C, with modifications as noted below. 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 I discussed this with the patient and daughter and have recommended he f/u with his cardiologist in [place] regarding this, as at some point it will require intervention. Given tachycardia, I will increase  Toprol-XL to 25 mg daily. This dose should not be high enough to result in narcolepsy. I am less inclined to use a calcium channel blocker given his LV dysfunction. No additional recommendations at this time.  Prentice Docker, MD, [facility]  07/02/2015 9:28 AM

## 2015-07-02 NOTE — Progress Notes (Signed)
[facility] PROGRESS NOTE  Leon Freeman JGO:[id number] DOB: [birth year] DOA: 06/30/2015 PCP: ADDIS,DANIEL, DO  HPI/Brief narrative 82 y.o. male with hx of CAD/CABG/ porcine AVR 2009, R CEA < 2009, CM EF ~20% f/b cardiology in [place], [place]. Pt presented with episodes of passing out. During w/u, pt was noted to have acute R peroneal DVT.  Assessment/Plan: Increased lethargy -Initially suspected to be related to beta blocker, thus was d/c'd -Cardiology consulted. Pt tachycardic this AM, thus toprol increased to 25mg  daily. Cardiology recs against calcium channel blocker given LV dysfunction -He had a recent GI bleed but his hemoglobin remains stable, he does have severe systolic CHF with known ejection fraction of 20%. -He seems improved.  Acute lower extremity DVT -Anticoagulation situation is tricky given his recent GI bleed.  -He has been started on a low bolus dose IV heparin. -VQ scan shows low probability for PE. -Appreciate input by GI. If pt remains without bleeding x another 24hrs, then would consider transition to oral regimen at that time  Elevated troponin -Likely represents demand ischemia. -EKG does not have acute ischemic abnormalities. -Troponins are decreasing. -Cardiology is following  Code Status: Full Family Communication: Pt in room, family at bedside Disposition Plan: Possible d/c in 24-48hrs   Consultants:  Cardiology  Procedures:    Antibiotics: Anti-infectives    None      HPI/Subjective: Eager to go home  Objective: Filed Vitals:   07/01/15 2100 07/02/15 0500 07/02/15 0958 07/02/15 1000  BP: 119/75 128/71  130/82  Pulse: 99 95 110 107  Temp: 98.2 F (36.8 C) 98.9 F (37.2 C)    TempSrc: Oral Oral    Resp: 16 16 16    Height:      Weight:  81.102 kg (178 lb 12.8 oz)    SpO2: 93% 91% 91%     Intake/Output Summary (Last 24 hours) at 07/02/15 1434 Last data filed at 07/02/15 1004  Gross per 24 hour  Intake 943.03 ml  Output       0 ml  Net 943.03 ml   Filed Weights   06/30/15 2053 07/01/15 0500 07/02/15 0500  Weight: 81.965 kg (180 lb 11.2 oz) 81.103 kg (178 lb 12.8 oz) 81.102 kg (178 lb 12.8 oz)    Exam:   General:  Awake, in nad  Cardiovascular: regular, s1, s2  Respiratory: normal resp effort, no wheezing  Abdomen: soft,nondistended  Musculoskeletal: perfused, no clubbing   Data Reviewed: Basic Metabolic Panel:  Recent Labs Lab 06/30/15 1134  NA 134*  Freeman 4.3  CL 104  CO2 21*  GLUCOSE 132*  BUN 33*  CREATININE 1.40*  CALCIUM 8.8*   Liver Function Tests: No results for input(s): AST, ALT, ALKPHOS, BILITOT, PROT, ALBUMIN in the last 168 hours. No results for input(s): LIPASE, AMYLASE in the last 168 hours. No results for input(s): AMMONIA in the last 168 hours. CBC:  Recent Labs Lab 06/27/15 1425 06/28/15 0430 06/30/15 1134 07/01/15 0327 07/02/15 0632  WBC  --   --  9.8 9.1 10.6*  HGB 6.7* 9.3* 9.0* 8.8* 9.5*  HCT 22.5* 29.5* 29.0* 28.4* 31.1*  MCV  --   --  76.5* 76.8* 76.6*  PLT  --   --  284 258 339   Cardiac Enzymes:  Recent Labs Lab 06/30/15 1325 07/01/15 0327  TROPONINI 0.33* 0.25*   BNP (last 3 results) No results for input(s): BNP in the last 8760 hours.  ProBNP (last 3 results) No results for input(s): PROBNP in the last  8760 hours.  CBG: No results for input(s): GLUCAP in the last 168 hours.  No results found for this or any previous visit (from the past 240 hour(s)).   Studies: US Carotid Bilateral  06/30/2015  CLINICAL DATA:  Syncope versus narcolepsy. Hypertension, hyperlipidemia, previous tobacco abuse, coronary disease EXAM: BILATERAL CAROTID DUPLEX ULTRASOUND TECHNIQUE: Wallace Cullens scale imaging, color Doppler and duplex ultrasound was performed of bilateral carotid and vertebral arteries in the neck. COMPARISON:  None. REVIEW OF SYSTEMS: Quantification of carotid stenosis is based on velocity parameters that correlate the residual internal carotid diameter  with NASCET-based stenosis levels, using the diameter of the distal internal carotid lumen as the denominator for stenosis measurement. The following velocity measurements were obtained: PEAK SYSTOLIC/END DIASTOLIC RIGHT ICA:                     96/38cm/sec CCA:                     90/27cm/sec SYSTOLIC ICA/CCA RATIO:  1.1 DIASTOLIC ICA/CCA RATIO: 1.6 ECA:                     50cm/sec LEFT ICA:                     210/68cm/sec CCA:                     100/24cm/sec SYSTOLIC ICA/CCA RATIO:  2.3 DIASTOLIC ICA/CCA RATIO: 2.6 ECA:                     231cm/sec FINDINGS: RIGHT CAROTID ARTERY: Eccentric partially calcified nonocclusive plaque in the mid and distal common carotid artery and bulb extending to the origin of the ICA. No high-grade stenosis. Normal waveforms and color Doppler signal. RIGHT VERTEBRAL ARTERY:  Normal flow direction and waveform. LEFT CAROTID ARTERY: Eccentric nonocclusive plaque in the mid common carotid artery. More extensive circumferential partially calcified plaque in the carotid bulb extending into proximal internal and external carotid arteries resulting in at least mild stenosis. There are elevated peak systolic velocities in the proximal external carotid artery and mid ICA distal to the plaque. LEFT VERTEBRAL ARTERY: Normal flow direction and waveform. IMPRESSION: 1. Bilateral carotid bifurcation and proximal ICA plaque, resulting in less than 50% diameter stenosis on the right, 50-69% diameter stenosis on the left. The exam does not exclude plaque ulceration or embolization. Continued surveillance recommended. Electronically Signed   By: Corlis Leak M.D.   On: 06/30/2015 17:37   Nm Pulmonary Perf And Vent  06/30/2015  CLINICAL DATA:  Syncopal events. EXAM: NUCLEAR MEDICINE VENTILATION - PERFUSION LUNG SCAN TECHNIQUE: Ventilation images were obtained in multiple projections using inhaled aerosol Tc-59m DTPA. Perfusion images were obtained in multiple projections after intravenous  injection of Tc-43m MAA. RADIOPHARMACEUTICALS:  33 mCi Technetium-77m DTPA aerosol inhalation and 3.3 mCi Technetium-50m MAA IV COMPARISON:  Chest radiograph from earlier today. FINDINGS: Ventilation: Mildly heterogeneous ventilation throughout both lungs, suggesting airways disease. Perfusion: No unmatched perfusion defects. There is a single moderate matched perfusion defect in the right upper lobe. IMPRESSION: Low probability for pulmonary embolism (10-19%). Electronically Signed   By: Delbert Phenix M.D.   On: 06/30/2015 19:02   US Venous Img Lower Bilateral  06/30/2015  CLINICAL DATA:  Bilateral lower extremity edema x8 months, right worse than left. EXAM: Bilateral LOWER EXTREMITY VENOUS DOPPLER ULTRASOUND TECHNIQUE: Gray-scale sonography with compression, as well as color and duplex ultrasound,  were performed to evaluate the deep venous system from the level of the common femoral vein through the popliteal and proximal calf veins. COMPARISON:  None FINDINGS: On the right, Normal compressibility of the common femoral, superficial femoral, and popliteal veins. There is a distended thrombosed peroneal vein which is noncompressible with no flow signal on color Doppler. On the left, Normal compressibility of the common femoral, superficial femoral, and popliteal veins, as well as the proximal calf veins. No filling defects to suggest DVT on grayscale or color Doppler imaging. Doppler waveforms show normal direction of venous flow, normal respiratory phasicity and response to augmentation. IMPRESSION: 1. POSITIVE for isolated right peroneal vein (calf) DVT. 2. No left lower extremity DVT. These results will be called to the ordering clinician or representative by the Radiologist Assistant, and communication documented in the PACS or zVision Dashboard. Electronically Signed   By: Corlis Leak M.D.   On: 06/30/2015 17:33    Scheduled Meds: . aspirin EC  81 mg Oral Daily  . atorvastatin  80 mg Oral Daily  .  calcium carbonate  1 tablet Oral Daily  . cholecalciferol  2,000 Units Oral Daily  . metoprolol succinate  25 mg Oral Daily  . pantoprazole  40 mg Oral Daily  . predniSONE  5 mg Oral Q breakfast  . sodium chloride flush  3 mL Intravenous Q12H  . vitamin B-12  1,000 mcg Oral Daily  . vitamin C  500 mg Oral Daily   Continuous Infusions: . 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 If 7PM-7AM, please contact night-coverage at [email or web address], password [facility] 07/02/2015, 2:34 PM  LOS: 2 days

## 2015-07-03 ENCOUNTER — Inpatient Hospital Stay (HOSPITAL_COMMUNITY): Payer: Medicare Other

## 2015-07-03 ENCOUNTER — Encounter (HOSPITAL_COMMUNITY)
Admission: EM | Disposition: A | Discharge status: Home Health | Payer: Self-pay | Source: Home / Self Care | Attending: Internal Medicine

## 2015-07-03 ENCOUNTER — Encounter (HOSPITAL_COMMUNITY): Payer: Self-pay | Admitting: Internal Medicine

## 2015-07-03 DIAGNOSIS — I5021 Acute systolic (congestive) heart failure: Secondary | ICD-10-CM

## 2015-07-03 DIAGNOSIS — I509 Heart failure, unspecified: Secondary | ICD-10-CM

## 2015-07-03 DIAGNOSIS — I5023 Acute on chronic systolic (congestive) heart failure: Secondary | ICD-10-CM

## 2015-07-03 DIAGNOSIS — R000 Tachycardia, unspecified: Secondary | ICD-10-CM

## 2015-07-03 LAB — CBC
HCT: 31.6 % — ABNORMAL LOW (ref 39.0–52.0)
Hemoglobin: 9.5 g/dL — ABNORMAL LOW (ref 13.0–17.0)
MCH: 23.4 pg — ABNORMAL LOW (ref 26.0–34.0)
MCHC: 30.1 g/dL (ref 30.0–36.0)
MCV: 77.8 fL — ABNORMAL LOW (ref 78.0–100.0)
Platelets: 334 K/uL (ref 150–400)
RBC: 4.06 MIL/uL — ABNORMAL LOW (ref 4.22–5.81)
RDW: 18.9 % — ABNORMAL HIGH (ref 11.5–15.5)
WBC: 10.3 K/uL (ref 4.0–10.5)

## 2015-07-03 LAB — BASIC METABOLIC PANEL WITH GFR
Anion gap: 12 (ref 5–15)
BUN: 22 mg/dL — ABNORMAL HIGH (ref 6–20)
CO2: 21 mmol/L — ABNORMAL LOW (ref 22–32)
Calcium: 8.8 mg/dL — ABNORMAL LOW (ref 8.9–10.3)
Chloride: 107 mmol/L (ref 101–111)
Creatinine, Ser: 1.13 mg/dL (ref 0.61–1.24)
GFR calc Af Amer: >60 mL/min (ref >60)
GFR calc non Af Amer: 59 mL/min — ABNORMAL LOW (ref >60)
Glucose, Bld: 124 mg/dL — ABNORMAL HIGH (ref 65–99)
Potassium: 3.8 mmol/L (ref 3.5–5.1)
Sodium: 140 mmol/L (ref 135–145)

## 2015-07-03 LAB — HEPARIN LEVEL (UNFRACTIONATED): Heparin Unfractionated: 0.2 IU/mL — ABNORMAL LOW (ref 0.30–0.70)

## 2015-07-03 SURGERY — EGD (ESOPHAGOGASTRODUODENOSCOPY)
Anesthesia: Moderate Sedation

## 2015-07-03 MED ORDER — FUROSEMIDE 10 MG/ML IJ SOLN
40.0000 mg | Freq: Once | INTRAMUSCULAR | Status: AC
  Administered 2015-07-03: 40 mg via INTRAVENOUS
  Filled 2015-07-03: qty 4

## 2015-07-03 MED ORDER — DIGOXIN 0.25 MG/ML IJ SOLN
0.5000 mg | Freq: Once | INTRAMUSCULAR | Status: AC
  Administered 2015-07-03: 0.5 mg via INTRAVENOUS
  Filled 2015-07-03: qty 2

## 2015-07-03 MED ORDER — HEPARIN BOLUS VIA INFUSION
1000.0000 [IU] | Freq: Once | INTRAVENOUS | Status: AC
  Administered 2015-07-03: 1000 [IU] via INTRAVENOUS
  Filled 2015-07-03: qty 1000

## 2015-07-03 MED ORDER — MENTHOL 3 MG MT LOZG: 1.0000 | LOZENGE | OROMUCOSAL | Status: DC | PRN

## 2015-07-03 MED ORDER — APIXABAN 5 MG PO TABS
10.0000 mg | ORAL_TABLET | Freq: Two times a day (BID) | ORAL | Status: DC
  Administered 2015-07-03 – 2015-07-06 (×6): 10 mg via ORAL
  Filled 2015-07-03 (×6): qty 2

## 2015-07-03 MED ORDER — DIGOXIN 125 MCG PO TABS
0.1250 mg | ORAL_TABLET | Freq: Every day | ORAL | Status: DC
  Administered 2015-07-03 – 2015-07-06 (×4): 0.125 mg via ORAL
  Filled 2015-07-03 (×5): qty 1

## 2015-07-03 MED ORDER — APIXABAN 5 MG PO TABS: 5.0000 mg | ORAL_TABLET | Freq: Two times a day (BID) | ORAL | Status: DC

## 2015-07-03 NOTE — Progress Notes (Signed)
ANTICOAGULATION CONSULT NOTE - Initial Consult  Pharmacy Consult for Apixaban Indication: DVT  Allergies  Allergen Reactions  . Penicillins Itching    Has patient had a PCN reaction causing immediate rash, facial/tongue/throat swelling, SOB or lightheadedness with hypotension: Yes Has patient had a PCN reaction causing severe rash involving mucus membranes or skin necrosis: No Has patient had a PCN reaction that required hospitalization No Has patient had a PCN reaction occurring within the last 10 years: No If all of the above answers are "NO", then may proceed with Cephalosporin use.    . Plaquenil [Hydroxychloroquine Sulfate] Itching and Other (See Comments)    redness    Patient Measurements: Height: 5\' 7"  (170.2 cm) Weight: 178 lb 8 oz (80.967 kg) IBW/kg (Calculated) : 66.1 Heparin Dosing Weight:   Vital Signs: Temp: 98.6 F (37 C) (03/30 1721) Temp Source: Oral (03/30 1721) BP: 98/57 mmHg (03/30 1721) Pulse Rate: 105 (03/30 1721)  Labs:  Recent Labs  07/01/15 0327 07/02/15 07/04/15 07/03/15 0608 07/03/15 0609  HGB 8.8* 9.5*  --  9.5*  HCT 28.4* 31.1*  --  31.6*  PLT 258 339  --  334  HEPARINUNFRC 0.31 0.36 0.20*  --   CREATININE  --   --   --  1.13  TROPONINI 0.25*  --   --   --     Estimated Creatinine Clearance: 51.4 mL/min (by C-G formula based on Cr of 1.13).   Medical History: Past Medical History  Diagnosis Date  . Anemia   . Coronary artery disease     Medications:  Prescriptions prior to admission  Medication Sig Dispense Refill Last Dose  . aspirin EC 81 MG tablet Take 1 tablet (81 mg total) by mouth daily.   06/30/2015 at Unknown time  . atorvastatin (LIPITOR) 80 MG tablet Take 80 mg by mouth daily.   06/30/2015 at Unknown time  . BIOTIN PO Take 1 tablet by mouth daily.   06/30/2015 at Unknown time  . calcium gluconate 500 MG tablet Take 1 tablet by mouth daily.   06/30/2015 at Unknown time  . Cholecalciferol (VITAMIN D) 2000 units CAPS Take 1  capsule by mouth daily.    06/30/2015 at Unknown time  . Glucosamine-Chondroit-Vit C-Mn (GLUCOSAMINE 1500 COMPLEX) CAPS Take 1 capsule by mouth daily.    Past Month at Unknown time  . Lactobacillus (BIOTINEX PO) Take 10,000 mcg by mouth daily.   06/30/2015 at Unknown time  . metoprolol succinate (TOPROL-XL) 50 MG 24 hr tablet Take 50 mg by mouth daily. Take with or immediately following a meal.   06/30/2015 at Unknown time  . METRONIDAZOLE, TOPICAL, (METROLOTION) 0.75 % LOTN Apply topically daily.   06/30/2015 at Unknown time  . omeprazole (PRILOSEC) 40 MG capsule Take 40 mg by mouth daily.   06/30/2015 at Unknown time  . predniSONE (DELTASONE) 5 MG tablet Take 5 mg by mouth daily with breakfast.   06/30/2015 at Unknown time  . vitamin B-12 (CYANOCOBALAMIN) 1000 MCG tablet Take 1,000 mcg by mouth daily.   06/30/2015 at Unknown time  . vitamin C (ASCORBIC ACID) 500 MG tablet Take 500 mg by mouth daily.   06/30/2015 at Unknown time  . Vitamins-Lipotropics (LIPO-FLAVONOID PLUS) TABS Take 2 tablets by mouth daily.   06/30/2015 at Unknown time  . Zinc 25 MG TABS Take 1 tablet by mouth daily.    06/30/2015 at Unknown time  . clindamycin (CLEOCIN) 300 MG capsule Take 300 mg by mouth See admin instructions. Patient  will takes 2 by mouth prior to dental treatment.   unknown   Scheduled:  . apixaban  10 mg Oral BID   Followed by  . [START ON 07/10/2015] apixaban  5 mg Oral BID  . aspirin EC  81 mg Oral Daily  . atorvastatin  80 mg Oral Daily  . calcium carbonate  1 tablet Oral Daily  . cholecalciferol  2,000 Units Oral Daily  . digoxin  0.125 mg Oral Daily  . metoprolol succinate  25 mg Oral Daily  . pantoprazole  40 mg Oral Daily  . predniSONE  5 mg Oral Q breakfast  . sodium chloride flush  3 mL Intravenous Q12H  . vitamin B-12  1,000 mcg Oral Daily  . vitamin C  500 mg Oral Daily    Assessment: Pharmacy consulted to dose and monitor apixaban in this 82 year old male with a history of CAD/CABG/porcine AVR  and currently found to have an acute Rt peroneal DVT.   Patient is currently on Heparin gtt for DVT.   Goal of Therapy:   Monitor platelets by anticoagulation protocol: Yes   Plan:  Will give apixaban 10 mg PO BID x 7 days starting @ 22:00 on 3/30 then apixaban 5 mg PO BID thereafter. Heparin gtt scheduled to be discontinued prior to 22:00 dose of apixaban. Pharmacy to follow and adjust dosing as needed.    Demetrius Charity, PharmD, BCPS Clinical Pharmacist

## 2015-07-03 NOTE — Progress Notes (Signed)
Patient's oxygen flowmeter was broken. RT replaced with new one. Patient is on 3.5 lpm nasal cannula with humidity and his O2 sat is 92%

## 2015-07-03 NOTE — Progress Notes (Signed)
The patient was seen and examined, and I agree with thephysical exam, assessment and plan as documented above, with modifications as noted below. Remains tachycardic on Toprol-XL 25 mg daily, half of his outpatient dose. I will give IV digoxin x one dose followed by oral digoxin daily. I would not be opposed to resuming his outpatient dose of metoprolol, as this dose should not be high enough to result in narcolepsy. He has some degree of CHF and will give IV Lasix 40 mg x one dose. He will likely need daily outpatient diuretics given his bioprosthetic aortic valve stenosis. He would like to f/u with [facility], in which case I will make an outpatient referral to our TAVR physicians to assess his candidacy.  Prentice Docker, MD, [facility]  07/03/2015 9:26 AM

## 2015-07-03 NOTE — Progress Notes (Signed)
2154 Patient requesting PRN order for cough drop, mid-level notified.

## 2015-07-03 NOTE — Progress Notes (Signed)
ANTICOAGULATION CONSULT NOTE -   Pharmacy Consult for Heparin Indication: DVT  Allergies  Allergen Reactions  . Penicillins Itching    Has patient had a PCN reaction causing immediate rash, facial/tongue/throat swelling, SOB or lightheadedness with hypotension: Yes Has patient had a PCN reaction causing severe rash involving mucus membranes or skin necrosis: No Has patient had a PCN reaction that required hospitalization No Has patient had a PCN reaction occurring within the last 10 years: No If all of the above answers are "NO", then may proceed with Cephalosporin use.    . Plaquenil [Hydroxychloroquine Sulfate] Itching and Other (See Comments)    redness   Patient Measurements: Height: 5\' 7"  (170.2 cm) Weight: 178 lb 8 oz (80.967 kg) IBW/kg (Calculated) : 66.1 HEPARIN DW (KG): 82   Vital Signs: Temp: 98.7 F (37.1 C) (03/30 1100) Temp Source: Oral (03/30 1100) BP: 110/71 mmHg (03/30 1100) Pulse Rate: 100 (03/30 1100)  Labs:  Recent Labs  06/30/15 1134 06/30/15 1325 07/01/15 0327 07/02/15 0632 07/03/15 0608 07/03/15 0609  HGB 9.0*  --  8.8* 9.5*  --  9.5*  HCT 29.0*  --  28.4* 31.1*  --  31.6*  PLT 284  --  258 339  --  334  LABPROT  --  15.6*  --   --   --   --   INR  --  1.22  --   --   --   --   HEPARINUNFRC  --   --  0.31 0.36 0.20*  --   CREATININE 1.40*  --   --   --   --  1.13  TROPONINI  --  0.33* 0.25*  --   --   --    Estimated Creatinine Clearance: 51.4 mL/min (by C-G formula based on Cr of 1.13).  Medical History: Past Medical History  Diagnosis Date  . Anemia   . Coronary artery disease    Medications:  Prescriptions prior to admission  Medication Sig Dispense Refill Last Dose  . aspirin EC 81 MG tablet Take 1 tablet (81 mg total) by mouth daily.   06/30/2015 at Unknown time  . atorvastatin (LIPITOR) 80 MG tablet Take 80 mg by mouth daily.   06/30/2015 at Unknown time  . BIOTIN PO Take 1 tablet by mouth daily.   06/30/2015 at Unknown time  .  calcium gluconate 500 MG tablet Take 1 tablet by mouth daily.   06/30/2015 at Unknown time  . Cholecalciferol (VITAMIN D) 2000 units CAPS Take 1 capsule by mouth daily.    06/30/2015 at Unknown time  . Glucosamine-Chondroit-Vit C-Mn (GLUCOSAMINE 1500 COMPLEX) CAPS Take 1 capsule by mouth daily.    Past Month at Unknown time  . Lactobacillus (BIOTINEX PO) Take 10,000 mcg by mouth daily.   06/30/2015 at Unknown time  . metoprolol succinate (TOPROL-XL) 50 MG 24 hr tablet Take 50 mg by mouth daily. Take with or immediately following a meal.   06/30/2015 at Unknown time  . METRONIDAZOLE, TOPICAL, (METROLOTION) 0.75 % LOTN Apply topically daily.   06/30/2015 at Unknown time  . omeprazole (PRILOSEC) 40 MG capsule Take 40 mg by mouth daily.   06/30/2015 at Unknown time  . predniSONE (DELTASONE) 5 MG tablet Take 5 mg by mouth daily with breakfast.   06/30/2015 at Unknown time  . vitamin B-12 (CYANOCOBALAMIN) 1000 MCG tablet Take 1,000 mcg by mouth daily.   06/30/2015 at Unknown time  . vitamin C (ASCORBIC ACID) 500 MG tablet Take 500 mg  by mouth daily.   06/30/2015 at Unknown time  . Vitamins-Lipotropics (LIPO-FLAVONOID PLUS) TABS Take 2 tablets by mouth daily.   06/30/2015 at Unknown time  . Zinc 25 MG TABS Take 1 tablet by mouth daily.    06/30/2015 at Unknown time  . clindamycin (CLEOCIN) 300 MG capsule Take 300 mg by mouth See admin instructions. Patient will takes 2 by mouth prior to dental treatment.   unknown   Home meds reviewed.  Assessment: Okay for Protocol, recently admitted with anemia and AVMs. He underwent endoscopy and clipping of an AVM by Dr Renae Fickle.  New DVT diagnoses today. Hg 9.0. Baseline anti-coag labs noted (slightly elevated PT).  Home Plavix stopped during previous admission.  Heparin level subtherapeutic 0.2.  Transition to another agent if no bleeding for 48 hours per cardiology note 3/29. Will increase heparin  Goal of Therapy:  Heparin level 0.3-0.7 units/ml Monitor platelets by  anticoagulation protocol: Yes   Plan:   Will dose conservatively due to recent GI Issues.  Bolus 1000 units and increase Heparin at 1250 units/hr             CBC daily while on Heparin  HL daily  Elder Cyphers, BS Pharm D, [place] Clinical Pharmacist Pager [phone]  07/03/2015,11:27 AM

## 2015-07-03 NOTE — Clinical Documentation Improvement (Signed)
Internal Medicine  Please clarify the likely etiology of the patient's Syncope and document findings in next progress note. Thank you!.    Syncope likely secondary to: Fe Deficiency Anemia secondary to Chronic GIB  Syncope likely secondary to possible Narcolepsy  Syncope likely secondary to Cardiac Arrhythmia  Syncope likely secondary to Other Cause  Clinically Undetermined  Supporting Information:  Please exercise your independent, professional judgment when responding. A specific answer is not anticipated or expected.  Thank You, Shellee Milo RN, BSN, CCDS Health Information Management [place] [phone]; Cell: [phone]

## 2015-07-03 NOTE — Clinical Documentation Improvement (Signed)
Cardiology Internal Medicine  Can the diagnosis of CHF be further specified? Please document response in next progress note. Thank you!    Acuity - Acute, Chronic, Acute on Chronic   Type - Systolic, Diastolic, Systolic and Diastolic  Other  Clinically Undetermined  Document any associated diagnoses/conditions  Supporting Information:  Recent ECHO with EF of 40-45% with mild LVH, diffuse hypokinesis with abnormal left ventricular relaxation  Given IV Lasix once; on PO Beta Blocker 25 mg daily  Please exercise your independent, professional judgment when responding. A specific answer is not anticipated or expected.  Thank You,  Shellee Milo RN, BSN, CCDS Health Information Management [place] [phone]; Cell: [phone]

## 2015-07-03 NOTE — Progress Notes (Signed)
RT called to patients room due to patients sats dropping. Upon arrival sats were 84%. Nasal cannula did not seem to be working. RT placed patient on new nasal cannula and increased flow from 2 to 5L. Sats improved to 91%. RT left patient on 5L but will continue to monitor and titrate down as tolerated.

## 2015-07-03 NOTE — Progress Notes (Signed)
[facility] PROGRESS NOTE  Leon Freeman IRW:[id number] DOB: 03/21/1932 DOA: 06/30/2015 PCP: ADDIS,DANIEL, DO  HPI/Brief narrative 82 y.o. male with hx of CAD/CABG/ porcine AVR 2009, R CEA < 2009, CM EF ~20% f/b cardiology in [place], [place]. Pt presented with episodes of passing out. During w/u, pt was noted to have acute R peroneal DVT.  Assessment/Plan: Increased lethargy -Initially suspected to be related to beta blocker, thus was d/c'd -Cardiology was consulted. Pt tachycardic this AM, thus toprol increased to 25mg  daily. Cardiology recs against calcium channel blocker given LV dysfunction -He had a recent GI bleed but his hemoglobin remains stable, he does have severe systolic CHF with known ejection fraction of 20%. -He seems improved and appears at baseline today.  Acute lower extremity DVT -Anticoagulation situation is tricky given his recent GI bleed.  -He has been started on a low bolus dose IV heparin. -VQ scan shows low probability for PE. -Hgb has remained stable. Discussed with GI and OK to start PO AC, consider eliquis vs xaraleto. Will choose regimen based on cost of regimen. Discussed with case manager, pending decision  Elevated troponin -Likely represents demand ischemia. -EKG does not have acute ischemic abnormalities. -Troponins are decreasing. -Cardiology is following  Bioprosthetic Aortic valve stenosis -Cardiology to refer to TAVR physicians  Code Status: Full Family Communication: Pt in room, family at bedside Disposition Plan: Possible d/c in 24-48hrs   Consultants:  Cardiology  Procedures:    Antibiotics: Anti-infectives    None      HPI/Subjective: Patient asking about going home  Objective: Filed Vitals:   07/02/15 2100 07/03/15 0500 07/03/15 1100 07/03/15 1114  BP: 134/76 110/68 110/71   Pulse: 114 110 100   Temp: 98 F (36.7 C) 98.3 F (36.8 C) 98.7 F (37.1 C)   TempSrc: Oral Oral Oral   Resp: 16 18 20    Height:       Weight:  80.967 kg (178 lb 8 oz)    SpO2: 91% 90% 90% 88%    Intake/Output Summary (Last 24 hours) at 07/03/15 1554 Last data filed at 07/03/15 0900  Gross per 24 hour  Intake    480 ml  Output      0 ml  Net    480 ml   Filed Weights   07/01/15 0500 07/02/15 0500 07/03/15 0500  Weight: 81.103 kg (178 lb 12.8 oz) 81.102 kg (178 lb 12.8 oz) 80.967 kg (178 lb 8 oz)    Exam:   General:  Awake, in nad, sitting in chair  Cardiovascular: regular, s1, s2  Respiratory: normal resp effort, no wheezing  Abdomen: soft,nondistended  Musculoskeletal: perfused, no clubbing, no cyanosis  Data Reviewed: Basic Metabolic Panel:  Recent Labs Lab 06/30/15 1134 07/03/15 0609  NA 134* 140  K 4.3 3.8  CL 104 107  CO2 21* 21*  GLUCOSE 132* 124*  BUN 33* 22*  CREATININE 1.40* 1.13  CALCIUM 8.8* 8.8*   Liver Function Tests: No results for input(s): AST, ALT, ALKPHOS, BILITOT, PROT, ALBUMIN in the last 168 hours. No results for input(s): LIPASE, AMYLASE in the last 168 hours. No results for input(s): AMMONIA in the last 168 hours. CBC:  Recent Labs Lab 06/28/15 0430 06/30/15 1134 07/01/15 0327 07/02/15 0632 07/03/15 0609  WBC  --  9.8 9.1 10.6* 10.3  HGB 9.3* 9.0* 8.8* 9.5* 9.5*  HCT 29.5* 29.0* 28.4* 31.1* 31.6*  MCV  --  76.5* 76.8* 76.6* 77.8*  PLT  --  284 258 339 334  Cardiac Enzymes:  Recent Labs Lab 06/30/15 1325 07/01/15 0327  TROPONINI 0.33* 0.25*   BNP (last 3 results) No results for input(s): BNP in the last 8760 hours.  ProBNP (last 3 results) No results for input(s): PROBNP in the last 8760 hours.  CBG: No results for input(s): GLUCAP in the last 168 hours.  No results found for this or any previous visit (from the past 240 hour(s)).   Studies: Dg Chest Port 1 View  07/03/2015  CLINICAL DATA:  Congestive heart failure. EXAM: PORTABLE CHEST 1 VIEW COMPARISON:  June 30, 2015. FINDINGS: Stable cardiomegaly is noted. Status post coronary artery  bypass graft. No pneumothorax is noted. Increased central pulmonary vascular congestion is noted with probable bilateral perihilar and basilar pulmonary edema. Minimal left pleural effusion is noted. Narrowing of the subacromial space is noted bilaterally suggesting rotator cuff injury. Possible developing right upper lobe pneumonia or atelectasis is noted. IMPRESSION: Increased central pulmonary vascular congestion is noted with probable bilateral perihilar and basilar pulmonary edema. Minimal left pleural effusion is noted. Possible developing right upper lobe pneumonia or atelectasis is noted. Electronically Signed   By: Lupita Raider, M.D.   On: 07/03/2015 07:49    Scheduled Meds: . aspirin EC  81 mg Oral Daily  . atorvastatin  80 mg Oral Daily  . calcium carbonate  1 tablet Oral Daily  . cholecalciferol  2,000 Units Oral Daily  . digoxin  0.125 mg Oral Daily  . metoprolol succinate  25 mg Oral Daily  . pantoprazole  40 mg Oral Daily  . predniSONE  5 mg Oral Q breakfast  . sodium chloride flush  3 mL Intravenous Q12H  . vitamin B-12  1,000 mcg Oral Daily  . vitamin C  500 mg Oral Daily   Continuous Infusions: . 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 If 7PM-7AM, please contact night-coverage at [email or web address], password [facility] 07/03/2015, 3:54 PM  LOS: 3 days

## 2015-07-03 NOTE — Progress Notes (Signed)
Subjective:  SOB with exertion  Objective:  Vital Signs in the last 24 hours: Temp:  [98 F (36.7 C)-98.4 F (36.9 C)] 98.3 F (36.8 C) (03/30 0500) Pulse Rate:  [107-114] 110 (03/30 0500) Resp:  [16-18] 18 (03/30 0500) BP: (110-134)/(68-82) 110/68 mmHg (03/30 0500) SpO2:  [90 %-91 %] 90 % (03/30 0500) Weight:  [178 lb 8 oz (80.967 kg)] 178 lb 8 oz (80.967 kg) (03/30 0500)  Intake/Output from previous day:  Intake/Output Summary (Last 24 hours) at 07/03/15 0925 Last data filed at 07/02/15 1900  Gross per 24 hour  Intake    483 ml  Output      0 ml  Net    483 ml    Physical Exam: General appearance: alert, cooperative and no distress Neck: no JVD Lungs: rales at bases Heart: regular rate and rhythm and 2/6 systolic murmur Extremities: no edema Neurologic: Grossly normal   Rate: 110-120  Rhythm: sinus tachycardia  Lab Results:  Recent Labs  07/02/15 0632 07/03/15 0609  WBC 10.6* 10.3  HGB 9.5* 9.5*  PLT 339 334    Recent Labs  06/30/15 1134 07/03/15 0609  NA 134* 140  K 4.3 3.8  CL 104 107  CO2 21* 21*  GLUCOSE 132* 124*  BUN 33* 22*  CREATININE 1.40* 1.13    Recent Labs  06/30/15 1325 07/01/15 0327  TROPONINI 0.33* 0.25*    Recent Labs  06/30/15 1325  INR 1.22    Scheduled Meds: . aspirin EC  81 mg Oral Daily  . atorvastatin  80 mg Oral Daily  . calcium carbonate  1 tablet Oral Daily  . cholecalciferol  2,000 Units Oral Daily  . digoxin  0.5 mg Intravenous Once  . digoxin  0.125 mg Oral Daily  . metoprolol succinate  25 mg Oral Daily  . pantoprazole  40 mg Oral Daily  . predniSONE  5 mg Oral Q breakfast  . sodium chloride flush  3 mL Intravenous Q12H  . vitamin B-12  1,000 mcg Oral Daily  . vitamin C  500 mg Oral Daily   Continuous Infusions: . heparin 1,100 Units/hr (07/02/15 1625)   PRN Meds:.acetaminophen **OR** acetaminophen, polyethylene glycol   Imaging: Dg Chest Port 1 View  07/03/2015  CLINICAL DATA:   Congestive heart failure. EXAM: PORTABLE CHEST 1 VIEW COMPARISON:  June 30, 2015. FINDINGS: Stable cardiomegaly is noted. Status post coronary artery bypass graft. No pneumothorax is noted. Increased central pulmonary vascular congestion is noted with probable bilateral perihilar and basilar pulmonary edema. Minimal left pleural effusion is noted. Narrowing of the subacromial space is noted bilaterally suggesting rotator cuff injury. Possible developing right upper lobe pneumonia or atelectasis is noted. IMPRESSION: Increased central pulmonary vascular congestion is noted with probable bilateral perihilar and basilar pulmonary edema. Minimal left pleural effusion is noted. Possible developing right upper lobe pneumonia or atelectasis is noted. Electronically Signed   By: Lupita Raider, M.D.   On: 07/03/2015 07:49    Cardiac Studies: echo 07/01/15 Study Conclusions  - Left ventricle: The cavity size was normal. Wall thickness was  increased in a pattern of mild LVH. Systolic function was mildly  to moderately reduced. The estimated ejection fraction was in the  range of 40% to 45%. Diffuse hypokinesis. Doppler parameters are  consistent with abnormal left ventricular relaxation (grade 1  diastolic dysfunction). Doppler parameters are consistent with  high ventricular filling pressure. - Aortic valve: There appears to be bioprosthetic valve stenosis  with  reduced leaflet excursion. Peak velocities and mean  gradients are elevated for this type of bioprosthetic valve. Peak  velocity (S): 359 cm/s. Mean gradient (S): 34 mm Hg. Valve area  (VTI): 0.77 cm^2. Valve area (Vmax): 0.78 cm^2. Valve area  (Vmean): 0.73 cm^2. - Mitral valve: Calcified annulus. Mildly thickened leaflets .  There was mild regurgitation. - Left atrium: The atrium was severely dilated. - Right ventricle: Systolic function was mildly reduced. - Right atrium: The atrium was mildly dilated. - Tricuspid valve:  There was mild-moderate regurgitation. - Pulmonary arteries: Systolic pressure was mildly increased. PA  peak pressure: 41 mm Hg (S). - Systemic veins: IVC dilated with normal respiratory variation.  Estimated CVP 8 mmHg.   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 His echo shows moderate AS. This am he is in CHF. He is still tachycardic- beta blocker held on admission secondary to concern about possible bradycardia. Lasix ordered- Toprol increased- ? Continue Lanoxin.  Pt's daughter asked about possible dobutamine echo to further evaluate AS.   Corine Shelter PA-C 07/03/2015, 9:25 AM [phone]   The patient was seen and examined, and I agree with thephysical exam, assessment and plan as documented above, with modifications as noted below. Remains tachycardic on Toprol-XL 25 mg daily, half of his outpatient dose. I will give IV digoxin x one dose followed by oral digoxin daily. I would not be opposed to resuming his outpatient dose of metoprolol, as this dose should not be high enough to result in narcolepsy. He has some degree of CHF and will give IV Lasix 40 mg x one dose. He will likely need daily outpatient diuretics given his bioprosthetic aortic valve stenosis. He would like to f/u with [facility], in which case I will make an outpatient referral to our TAVR physicians to assess his candidacy.  Prentice Docker, MD, [facility]  07/03/2015 9:26 AM

## 2015-07-04 ENCOUNTER — Inpatient Hospital Stay (HOSPITAL_COMMUNITY): Payer: Medicare Other

## 2015-07-04 LAB — CBC
HEMATOCRIT: 32.4 % — AB (ref 39.0–52.0)
HEMOGLOBIN: 9.7 g/dL — AB (ref 13.0–17.0)
MCH: 23.3 pg — ABNORMAL LOW (ref 26.0–34.0)
MCHC: 29.9 g/dL — ABNORMAL LOW (ref 30.0–36.0)
MCV: 77.7 fL — ABNORMAL LOW (ref 78.0–100.0)
Platelets: 318 10*3/uL (ref 150–400)
RBC: 4.17 MIL/uL — AB (ref 4.22–5.81)
RDW: 18.7 % — ABNORMAL HIGH (ref 11.5–15.5)
WBC: 8.5 10*3/uL (ref 4.0–10.5)

## 2015-07-04 LAB — BASIC METABOLIC PANEL
ANION GAP: 9 (ref 5–15)
BUN: 24 mg/dL — ABNORMAL HIGH (ref 6–20)
CALCIUM: 8.5 mg/dL — AB (ref 8.9–10.3)
CHLORIDE: 108 mmol/L (ref 101–111)
CO2: 23 mmol/L (ref 22–32)
Creatinine, Ser: 1.23 mg/dL (ref 0.61–1.24)
GFR calc non Af Amer: 53 mL/min — ABNORMAL LOW (ref >60)
Glucose, Bld: 131 mg/dL — ABNORMAL HIGH (ref 65–99)
Potassium: 3.2 mmol/L — ABNORMAL LOW (ref 3.5–5.1)
Sodium: 140 mmol/L (ref 135–145)

## 2015-07-04 MED ORDER — DIGOXIN 0.25 MG/ML IJ SOLN
0.5000 mg | Freq: Once | INTRAMUSCULAR | Status: AC
  Administered 2015-07-04: 0.5 mg via INTRAVENOUS

## 2015-07-04 MED ORDER — FERROUS SULFATE 325 (65 FE) MG PO TABS
325.0000 mg | ORAL_TABLET | Freq: Every day | ORAL | Status: DC
  Administered 2015-07-05 – 2015-07-06 (×2): 325 mg via ORAL
  Filled 2015-07-04 (×2): qty 1

## 2015-07-04 MED ORDER — POTASSIUM CHLORIDE CRYS ER 20 MEQ PO TBCR
40.0000 meq | EXTENDED_RELEASE_TABLET | Freq: Once | ORAL | Status: AC
  Administered 2015-07-04: 40 meq via ORAL
  Filled 2015-07-04: qty 2

## 2015-07-04 MED ORDER — FUROSEMIDE 20 MG PO TABS: 20.0000 mg | ORAL_TABLET | Freq: Every day | ORAL | Status: DC

## 2015-07-04 MED ORDER — FUROSEMIDE 40 MG PO TABS
40.0000 mg | ORAL_TABLET | Freq: Every day | ORAL | Status: DC
  Administered 2015-07-04 – 2015-07-06 (×3): 40 mg via ORAL
  Filled 2015-07-04 (×3): qty 1

## 2015-07-04 MED ORDER — DIGOXIN 0.25 MG/ML IJ SOLN
0.5000 mg | Freq: Once | INTRAMUSCULAR | Status: DC
  Filled 2015-07-04: qty 2

## 2015-07-04 NOTE — Progress Notes (Addendum)
SUBJECTIVE: Feeling well. Says he was hypoxemic earlier but he didn't feel any more short of breath without oxygen. Denies chest pain. Wants to go home.     Intake/Output Summary (Last 24 hours) at 07/04/15 0933 Last data filed at 07/04/15 0900  Gross per 24 hour  Intake    600 ml  Output    425 ml  Net    175 ml    Current Facility-Administered Medications  Medication Dose Route Frequency Provider Last Rate Last Dose  . acetaminophen (TYLENOL) tablet 650 mg  650 mg Oral Q6H PRN Delano Metz, MD   650 mg at 07/03/15 2053   Or  . acetaminophen (TYLENOL) suppository 650 mg  650 mg Rectal Q6H PRN Delano Metz, MD      . apixaban Everlene Balls) tablet 10 mg  10 mg Oral BID Jerald Kief, MD   10 mg at 07/03/15 2049   Followed by  . [START ON 07/10/2015] apixaban (ELIQUIS) tablet 5 mg  5 mg Oral BID Jerald Kief, MD      . aspirin EC tablet 81 mg  81 mg Oral Daily Delano Metz, MD   81 mg at 07/03/15 1106  . atorvastatin (LIPITOR) tablet 80 mg  80 mg Oral Daily Delano Metz, MD   80 mg at 07/03/15 1105  . calcium carbonate (TUMS - dosed in mg elemental calcium) chewable tablet 200 mg of elemental calcium  1 tablet Oral Daily Delano Metz, MD   200 mg of elemental calcium at 07/03/15 1104  . cholecalciferol (VITAMIN D) tablet 2,000 Units  2,000 Units Oral Daily Delano Metz, MD   2,000 Units at 07/03/15 1105  . digoxin (LANOXIN) 0.25 MG/ML injection 0.5 mg  0.5 mg Intravenous Once Laqueta Linden, MD      . digoxin (LANOXIN) tablet 0.125 mg  0.125 mg Oral Daily Laqueta Linden, MD   0.125 mg at 07/03/15 1721  . menthol-cetylpyridinium (CEPACOL) lozenge 3 mg  1 lozenge Oral PRN Leda Gauze, NP      . metoprolol succinate (TOPROL-XL) 24 hr tablet 25 mg  25 mg Oral Daily Laqueta Linden, MD   25 mg at 07/03/15 1110  . pantoprazole (PROTONIX) EC tablet 40 mg  40 mg Oral Daily Delano Metz, MD   40 mg at 07/03/15 1105  . polyethylene glycol (MIRALAX /  GLYCOLAX) packet 17 g  17 g Oral Daily PRN Delano Metz, MD   17 g at 07/01/15 2232  . predniSONE (DELTASONE) tablet 5 mg  5 mg Oral Q breakfast Delano Metz, MD   5 mg at 07/04/15 0801  . sodium chloride flush (NS) 0.9 % injection 3 mL  3 mL Intravenous Q12H Delano Metz, MD   3 mL at 07/03/15 2102  . vitamin B-12 (CYANOCOBALAMIN) tablet 1,000 mcg  1,000 mcg Oral Daily Delano Metz, MD   1,000 mcg at 07/03/15 1105  . vitamin C (ASCORBIC ACID) tablet 500 mg  500 mg Oral Daily Delano Metz, MD   500 mg at 07/03/15 1106    Filed Vitals:   07/03/15 1725 07/03/15 2049 07/03/15 2121 07/04/15 0444  BP:  118/63  120/72  Pulse:  106  101  Temp:  98.6 F (37 C)  98.4 F (36.9 C)  TempSrc:  Oral  Oral  Resp:  20  18  Height:      Weight:    170 lb 12.8 oz (77.474 kg)  SpO2: 90%  92%  95%    PHYSICAL EXAM General appearance: alert, cooperative and no distress Neck: no JVD Lungs: Clear, no rales. Diminished at bases. Heart: mildly tachycardic, regular rhythm and 2/6 systolic murmur at RUSB Extremities: no edema Neurologic: Grossly normal  TELEMETRY: Reviewed telemetry pt in sinus tachycardia with PVC's.  LABS: Basic Metabolic Panel:  Recent Labs  40/10/27 0609 07/04/15 0633  NA 140 140  K 3.8 3.2*  CL 107 108  CO2 21* 23  GLUCOSE 124* 131*  BUN 22* 24*  CREATININE 1.13 1.23  CALCIUM 8.8* 8.5*   Liver Function Tests: No results for input(s): AST, ALT, ALKPHOS, BILITOT, PROT, ALBUMIN in the last 72 hours. No results for input(s): LIPASE, AMYLASE in the last 72 hours. CBC:  Recent Labs  07/03/15 0609 07/04/15 0633  WBC 10.3 8.5  HGB 9.5* 9.7*  HCT 31.6* 32.4*  MCV 77.8* 77.7*  PLT 334 318   Cardiac Enzymes: No results for input(s): CKTOTAL, CKMB, CKMBINDEX, TROPONINI in the last 72 hours. BNP: Invalid input(s): POCBNP D-Dimer: No results for input(s): DDIMER in the last 72 hours. Hemoglobin A1C: No results for input(s): HGBA1C in the last 72  hours. Fasting Lipid Panel: No results for input(s): CHOL, HDL, LDLCALC, TRIG, CHOLHDL, LDLDIRECT in the last 72 hours. Thyroid Function Tests: No results for input(s): TSH, T4TOTAL, T3FREE, THYROIDAB in the last 72 hours.  Invalid input(s): FREET3 Anemia Panel: No results for input(s): VITAMINB12, FOLATE, FERRITIN, TIBC, IRON, RETICCTPCT in the last 72 hours.  RADIOLOGY: Dg Chest 2 View  06/30/2015  CLINICAL DATA:  Loss of consciousness at home. EXAM: CHEST  2 VIEW COMPARISON:  September 06, 2010. FINDINGS: Stable cardiomediastinal silhouette. Status post coronary artery bypass graft. Status post aortic valve replacement. No pneumothorax or pleural effusion is noted. No acute pulmonary disease is noted. Bony thorax is unremarkable. IMPRESSION: No active cardiopulmonary disease. Electronically Signed   By: Lupita Raider, M.D.   On: 06/30/2015 14:30   Dg Forearm Left  06/30/2015  CLINICAL DATA:  Recent fall with left forearm bruising, initial encounter EXAM: LEFT FOREARM - 2 VIEW COMPARISON:  None. FINDINGS: No acute fracture or dislocation is noted. Degenerative changes are noted about the wrist joint. No gross soft tissue abnormality is seen. IMPRESSION: No acute abnormality noted. Electronically Signed   By: Alcide Clever M.D.   On: 06/30/2015 14:33   Ct Head Wo Contrast  06/30/2015  CLINICAL DATA:  Recent episode of loss of consciousness. EXAM: CT HEAD WITHOUT CONTRAST TECHNIQUE: Contiguous axial images were obtained from the base of the skull through the vertex without intravenous contrast. COMPARISON:  None. FINDINGS: There is moderate diffuse atrophy with ventricles in proportion relatively larger than the sulci. There is no intracranial mass, hemorrhage, extra-axial fluid collection, or midline shift. There is mild patchy small vessel disease in the centra semiovale bilaterally. There is asymmetric decreased attenuation in the anterior right corona radiata, consistent with focal age uncertain  white matter infarct. Elsewhere gray-white compartments appear normal. The bony calvarium appears intact. The mastoid air cells are clear. No intraorbital lesions are apparent. 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 Elsewhere there is atrophy with mild periventricular small vessel disease. No hemorrhage or mass effect. Note that the ventricles in proportion are slightly larger than the sulci. Question early normal pressure hydrocephalus superimposed on atrophy. Electronically Signed   By: Bretta Bang III M.D.   On: 06/30/2015 13:55   US Carotid Bilateral  06/30/2015  CLINICAL  DATA:  Syncope versus narcolepsy. Hypertension, hyperlipidemia, previous tobacco abuse, coronary disease EXAM: BILATERAL CAROTID DUPLEX ULTRASOUND TECHNIQUE: Wallace Cullens scale imaging, color Doppler and duplex ultrasound was performed of bilateral carotid and vertebral arteries in the neck. COMPARISON:  None. REVIEW OF SYSTEMS: Quantification of carotid stenosis is based on velocity parameters that correlate the residual internal carotid diameter with NASCET-based stenosis levels, using the diameter of the distal internal carotid lumen as the denominator for stenosis measurement. The following velocity measurements were obtained: PEAK SYSTOLIC/END DIASTOLIC RIGHT ICA:                     96/38cm/sec CCA:                     90/27cm/sec SYSTOLIC ICA/CCA RATIO:  1.1 DIASTOLIC ICA/CCA RATIO: 1.6 ECA:                     50cm/sec LEFT ICA:                     210/68cm/sec CCA:                     100/24cm/sec SYSTOLIC ICA/CCA RATIO:  2.3 DIASTOLIC ICA/CCA RATIO: 2.6 ECA:                     231cm/sec FINDINGS: RIGHT CAROTID ARTERY: Eccentric partially calcified nonocclusive plaque in the mid and distal common carotid artery and bulb extending to the origin of the ICA. No high-grade stenosis. Normal waveforms and color Doppler signal. RIGHT VERTEBRAL ARTERY:  Normal flow direction and  waveform. LEFT CAROTID ARTERY: Eccentric nonocclusive plaque in the mid common carotid artery. More extensive circumferential partially calcified plaque in the carotid bulb extending into proximal internal and external carotid arteries resulting in at least mild stenosis. There are elevated peak systolic velocities in the proximal external carotid artery and mid ICA distal to the plaque. LEFT VERTEBRAL ARTERY: Normal flow direction and waveform. IMPRESSION: 1. Bilateral carotid bifurcation and proximal ICA plaque, resulting in less than 50% diameter stenosis on the right, 50-69% diameter stenosis on the left. The exam does not exclude plaque ulceration or embolization. Continued surveillance recommended. Electronically Signed   By: Corlis Leak M.D.   On: 06/30/2015 17:37   Nm Pulmonary Perf And Vent  06/30/2015  CLINICAL DATA:  Syncopal events. EXAM: NUCLEAR MEDICINE VENTILATION - PERFUSION LUNG SCAN TECHNIQUE: Ventilation images were obtained in multiple projections using inhaled aerosol Tc-6m DTPA. Perfusion images were obtained in multiple projections after intravenous injection of Tc-51m MAA. RADIOPHARMACEUTICALS:  33 mCi Technetium-73m DTPA aerosol inhalation and 3.3 mCi Technetium-51m MAA IV COMPARISON:  Chest radiograph from earlier today. FINDINGS: Ventilation: Mildly heterogeneous ventilation throughout both lungs, suggesting airways disease. Perfusion: No unmatched perfusion defects. There is a single moderate matched perfusion defect in the right upper lobe. IMPRESSION: Low probability for pulmonary embolism (10-19%). Electronically Signed   By: Delbert Phenix M.D.   On: 06/30/2015 19:02   US Venous Img Lower Bilateral  06/30/2015  CLINICAL DATA:  Bilateral lower extremity edema x8 months, right worse than left. EXAM: Bilateral LOWER EXTREMITY VENOUS DOPPLER ULTRASOUND TECHNIQUE: Gray-scale sonography with compression, as well as color and duplex ultrasound, were performed to evaluate the deep venous  system from the level of the common femoral vein through the popliteal and proximal calf veins. COMPARISON:  None FINDINGS: On the right, Normal compressibility of the common femoral, superficial femoral, and  popliteal veins. There is a distended thrombosed peroneal vein which is noncompressible with no flow signal on color Doppler. On the left, Normal compressibility of the common femoral, superficial femoral, and popliteal veins, as well as the proximal calf veins. No filling defects to suggest DVT on grayscale or color Doppler imaging. Doppler waveforms show normal direction of venous flow, normal respiratory phasicity and response to augmentation. IMPRESSION: 1. POSITIVE for isolated right peroneal vein (calf) DVT. 2. No left lower extremity DVT. These results will be called to the ordering clinician or representative by the Radiologist Assistant, and communication documented in the PACS or zVision Dashboard. Electronically Signed   By: Corlis Leak M.D.   On: 06/30/2015 17:33   Dg Chest Port 1 View  07/03/2015  CLINICAL DATA:  Congestive heart failure. EXAM: PORTABLE CHEST 1 VIEW COMPARISON:  June 30, 2015. FINDINGS: Stable cardiomegaly is noted. Status post coronary artery bypass graft. No pneumothorax is noted. Increased central pulmonary vascular congestion is noted with probable bilateral perihilar and basilar pulmonary edema. Minimal left pleural effusion is noted. Narrowing of the subacromial space is noted bilaterally suggesting rotator cuff injury. Possible developing right upper lobe pneumonia or atelectasis is noted. IMPRESSION: Increased central pulmonary vascular congestion is noted with probable bilateral perihilar and basilar pulmonary edema. Minimal left pleural effusion is noted. Possible developing right upper lobe pneumonia or atelectasis is noted. Electronically Signed   By: Lupita Raider, M.D.   On: 07/03/2015 07:49      ASSESSMENT AND PLAN: 1. Acute systolic heart failure:  Received IV Lasix yesterday. Lungs clear today. Will start oral Lasix 40 mg daily. Will also give another dose of IV digoxin 0.5 mg. If he remains tachycardic tomorrow, could increase oral digoxin to 0.25 mg daily and increase Toprol-XL to 37.5 mg daily to avoid further episodes of CHF.  2. Sinus tachycardia: Still mildly tachycardic. Will give another dose of IV digoxin 0.5 mg. If he remains tachycardic tomorrow, could increase oral digoxin to 0.25 mg daily and increase Toprol-XL to 37.5 mg daily.  3. Narcolepsy: No inpatient recurrences.  4. Bioprosthetic aortic valve stenosis: He would like to f/u with [facility], in which case I will make an outpatient referral to our TAVR physicians to assess his candidacy.  5. RUL pneumonia vs atelectasis: Prior hypoxic episode but afebrile and normal WBC count. Will defer to hospitalist service for further management. Doubt he needs antibiotics.   Prentice Docker, M.D., F.A.C.C.

## 2015-07-04 NOTE — Care Management Important Message (Signed)
Important Message  Patient Details  Name: Leon Freeman MRN: [id number] Date of Birth: 04-May-1931   Medicare Important Message Given:  Yes    Adonis Huguenin, RN 07/04/2015, 8:46 AM

## 2015-07-04 NOTE — Progress Notes (Addendum)
[facility] PROGRESS NOTE  Nosson Wender ZOX:[id number] DOB: [birth year] DOA: 06/30/2015 PCP: ADDIS,DANIEL, DO  HPI/Brief narrative 82 y.o. male with hx of CAD/CABG/ porcine AVR 2009, R CEA < 2009, CM EF ~20% f/b cardiology in [place], [place]. Pt presented with episodes of passing out. During w/u, pt was noted to have acute R peroneal DVT.  Assessment/Plan: Increased lethargy -Initially suspected to be related to beta blocker, thus was d/c'd -Cardiology was consulted. Pt remains mildly tachycardic. Per cardiology, recs to given another dose of IV digoxin and if remains tachycardic in AM, then increase PO digoxin to 0.25mg  and increase Toprol to 37.5mg  -He had a recent GI bleed but his hemoglobin remains stable, he does have severe systolic CHF with known ejection fraction of 20%.  Acute lower extremity DVT -Anticoagulation situation is tricky given his recent GI bleed.  -He has been started on a low bolus dose IV heparin. -VQ scan shows low probability for PE. -Hgb has remained stable. Discussed with GI and OK to start PO AC. Have transitioned to eliquis. Per CM, is $30/month   Elevated troponin -Likely represents demand ischemia. -EKG does not have acute ischemic abnormalities. -Troponins are decreasing. -Cardiology is following  Bioprosthetic Aortic valve stenosis -Cardiology to refer to TAVR physicians  Acutely decompensated chronic systolic CHF - PO lasix started by Cardiology   Code Status: Full Family Communication: Pt in room, family at bedside Disposition Plan: Possible d/c in 24-48hrs   Consultants:  Cardiology  Procedures:    Antibiotics: Anti-infectives    None      HPI/Subjective:   Objective: Filed Vitals:   07/03/15 1725 07/03/15 2049 07/03/15 2121 07/04/15 0444  BP:  118/63  120/72  Pulse:  106  101  Temp:  98.6 F (37 C)  98.4 F (36.9 C)  TempSrc:  Oral  Oral  Resp:  20  18  Height:      Weight:    77.474 kg (170 lb 12.8 oz)  SpO2:  90%  92% 95%    Intake/Output Summary (Last 24 hours) at 07/04/15 1318 Last data filed at 07/04/15 1241  Gross per 24 hour  Intake    960 ml  Output    425 ml  Net    535 ml   Filed Weights   07/02/15 0500 07/03/15 0500 07/04/15 0444  Weight: 81.102 kg (178 lb 12.8 oz) 80.967 kg (178 lb 8 oz) 77.474 kg (170 lb 12.8 oz)    Exam:   General:  Awake, in nad, sitting in chair  Cardiovascular: regular, s1, s2  Respiratory: normal resp effort, no wheezing  Abdomen: soft,nondistended  Musculoskeletal: perfused, no cyanosis  Data Reviewed: Basic Metabolic Panel:  Recent Labs Lab 06/30/15 1134 07/03/15 0609 07/04/15 0633  NA 134* 140 140  K 4.3 3.8 3.2*  CL 104 107 108  CO2 21* 21* 23  GLUCOSE 132* 124* 131*  BUN 33* 22* 24*  CREATININE 1.40* 1.13 1.23  CALCIUM 8.8* 8.8* 8.5*   Liver Function Tests: No results for input(s): AST, ALT, ALKPHOS, BILITOT, PROT, ALBUMIN in the last 168 hours. No results for input(s): LIPASE, AMYLASE in the last 168 hours. No results for input(s): AMMONIA in the last 168 hours. CBC:  Recent Labs Lab 06/30/15 1134 07/01/15 0327 07/02/15 0632 07/03/15 0609 07/04/15 0633  WBC 9.8 9.1 10.6* 10.3 8.5  HGB 9.0* 8.8* 9.5* 9.5* 9.7*  HCT 29.0* 28.4* 31.1* 31.6* 32.4*  MCV 76.5* 76.8* 76.6* 77.8* 77.7*  PLT 284 258 339 334 318  Cardiac Enzymes:  Recent Labs Lab 06/30/15 1325 07/01/15 0327  TROPONINI 0.33* 0.25*   BNP (last 3 results) No results for input(s): BNP in the last 8760 hours.  ProBNP (last 3 results) No results for input(s): PROBNP in the last 8760 hours.  CBG: No results for input(s): GLUCAP in the last 168 hours.  No results found for this or any previous visit (from the past 240 hour(s)).   Studies: Dg Chest Port 1 View  07/03/2015  CLINICAL DATA:  Congestive heart failure. EXAM: PORTABLE CHEST 1 VIEW COMPARISON:  June 30, 2015. FINDINGS: Stable cardiomegaly is noted. Status post coronary artery bypass graft.  No pneumothorax is noted. Increased central pulmonary vascular congestion is noted with probable bilateral perihilar and basilar pulmonary edema. Minimal left pleural effusion is noted. Narrowing of the subacromial space is noted bilaterally suggesting rotator cuff injury. Possible developing right upper lobe pneumonia or atelectasis is noted. IMPRESSION: Increased central pulmonary vascular congestion is noted with probable bilateral perihilar and basilar pulmonary edema. Minimal left pleural effusion is noted. Possible developing right upper lobe pneumonia or atelectasis is noted. Electronically Signed   By: Lupita Raider, M.D.   On: 07/03/2015 07:49    Scheduled Meds: . apixaban  10 mg Oral BID   Followed by  . [START ON 07/10/2015] apixaban  5 mg Oral BID  . aspirin EC  81 mg Oral Daily  . atorvastatin  80 mg Oral Daily  . calcium carbonate  1 tablet Oral Daily  . cholecalciferol  2,000 Units Oral Daily  . digoxin  0.5 mg Intravenous Once  . digoxin  0.125 mg Oral Daily  . furosemide  40 mg Oral Daily  . metoprolol succinate  25 mg Oral Daily  . pantoprazole  40 mg Oral Daily  . predniSONE  5 mg Oral Q breakfast  . sodium chloride flush  3 mL Intravenous Q12H  . vitamin B-12  1,000 mcg Oral Daily  . 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 If 7PM-7AM, please contact night-coverage at [email or web address], password [facility] 07/04/2015, 1:18 PM  LOS: 4 days

## 2015-07-04 NOTE — Progress Notes (Signed)
  Subjective:  Patient has no complaints. He has good appetite. He denies chest or abdominal pain. He also denies shortness of breath. He denies melena or rectal bleeding.  Objective: Blood pressure 109/67, pulse 101, temperature 98.5 F (36.9 C), temperature source Oral, resp. rate 18, height 5\' 7"  (1.702 m), weight 170 lb 12.8 oz (77.474 kg), SpO2 95 %. Patient is alert and in no acute distress. Cardiac exam with regular rhythm normal S1 and loud S2 with grade 2 over systolic ejection murmur best heard at aortic area. Few bibasilar crackles noted. Abdomen is soft and nontender. Right leg edema has decreased and now it is 1+.  Labs/studies Results:   Recent Labs  07/02/15 0632 07/03/15 0609 07/04/15 0633  WBC 10.6* 10.3 8.5  HGB 9.5* 9.5* 9.7*  HCT 31.1* 31.6* 32.4*  PLT 339 334 318    BMET   Recent Labs  07/03/15 0609 07/04/15 0633  NA 140 140  K 3.8 3.2*  CL 107 108  CO2 21* 23  GLUCOSE 124* 131*  BUN 22* 24*  CREATININE 1.13 1.23  CALCIUM 8.8* 8.5*     Assessment:  #1. 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 Patient is status post therapeutic EGD 1 week ago. He did require 2 units of PRBCs last week for hemoglobin of 6.7. Hemoglobin is gradually coming up. No evidence of active bleeding. #2. Right leg DVT. Patient transitioned to Eliquis. He is also on low-dose aspirin and therefore will have to monitor very closely. #3. CHF secondary to aortic valve disease. He appears to be doing better with diuretic to doc sent and reinstitution of beta blocker.  Plan:  Ferrous sulfate 325 mg by mouth daily.

## 2015-07-04 NOTE — Care Management (Signed)
Benefits check for Eliquis is $30 monthly co pay.

## 2015-07-04 NOTE — Discharge Instructions (Signed)
Apixaban oral tablets °What is this medicine? °APIXABAN (a PIX a ban) is an anticoagulant (blood thinner). It is used to lower the chance of stroke in people with a medical condition called atrial fibrillation. It is also used to treat or prevent blood clots in the lungs or in the veins. °This medicine may be used for other purposes; ask your health care provider or pharmacist if you have questions. °What should I tell my health care provider before I take this medicine? °They need to know if you have any of these conditions: °-bleeding disorders °-bleeding in the brain °-blood in your stools (black or tarry stools) or if you have blood in your vomit °-history of stomach bleeding °-kidney disease °-liver disease °-mechanical heart valve °-an unusual or allergic reaction to apixaban, other medicines, foods, dyes, or preservatives °-pregnant or trying to get pregnant °-breast-feeding °How should I use this medicine? °Take this medicine by mouth with a glass of water. Follow the directions on the prescription label. You can take it with or without food. If it upsets your stomach, take it with food. Take your medicine at regular intervals. Do not take it more often than directed. Do not stop taking except on your doctor's advice. Stopping this medicine may increase your risk of a blot clot. Be sure to refill your prescription before you run out of medicine. °Talk to your pediatrician regarding the use of this medicine in children. Special care may be needed. °Overdosage: If you think you have taken too much of this medicine contact a poison control center or emergency room at once. °NOTE: This medicine is only for you. Do not share this medicine with others. °What if I miss a dose? °If you miss a dose, take it as soon as you can. If it is almost time for your next dose, take only that dose. Do not take double or extra doses. °What may interact with this medicine? °This medicine may interact with the following: °-aspirin  and aspirin-like medicines °-certain medicines for fungal infections like ketoconazole and itraconazole °-certain medicines for seizures like carbamazepine and phenytoin °-certain medicines that treat or prevent blood clots like warfarin, enoxaparin, and dalteparin °-clarithromycin °-NSAIDs, medicines for pain and inflammation, like ibuprofen or naproxen °-rifampin °-ritonavir °-St. John's wort °This list may not describe all possible interactions. Give your health care provider a list of all the medicines, herbs, non-prescription drugs, or dietary supplements you use. Also tell them if you smoke, drink alcohol, or use illegal drugs. Some items may interact with your medicine. °What should I watch for while using this medicine? °Notify your doctor or health care professional and seek emergency treatment if you develop breathing problems; changes in vision; chest pain; severe, sudden headache; pain, swelling, warmth in the leg; trouble speaking; sudden numbness or weakness of the face, arm, or leg. These can be signs that your condition has gotten worse. °If you are going to have surgery, tell your doctor or health care professional that you are taking this medicine. °Tell your health care professional that you use this medicine before you have a spinal or epidural procedure. Sometimes people who take this medicine have bleeding problems around the spine when they have a spinal or epidural procedure. This bleeding is very rare. If you have a spinal or epidural procedure while on this medicine, call your health care professional immediately if you have back pain, numbness or tingling (especially in your legs and feet), muscle weakness, paralysis, or loss of bladder or bowel   control. °Avoid sports and activities that might cause injury while you are using this medicine. Severe falls or injuries can cause unseen bleeding. Be careful when using sharp tools or knives. Consider using an electric razor. Take special care  brushing or flossing your teeth. Report any injuries, bruising, or red spots on the skin to your doctor or health care professional. °What side effects may I notice from receiving this medicine? °Side effects that you should report to your doctor or health care professional as soon as possible: °-allergic reactions like skin rash, itching or hives, swelling of the face, lips, or tongue °-signs and symptoms of bleeding such as bloody or black, tarry stools; red or dark-brown urine; spitting up blood or brown material that looks like coffee grounds; red spots on the skin; unusual bruising or bleeding from the eye, gums, or nose °This list may not describe all possible side effects. Call your doctor for medical advice about side effects. You may report side effects to FDA at 1-800-FDA-1088. °Where should I keep my medicine? °Keep out of the reach of children. °Store at room temperature between 20 and 25 degrees C (68 and 77 degrees F). Throw away any unused medicine after the expiration date. °NOTE: This sheet is a summary. It may not cover all possible information. If you have questions about this medicine, talk to your doctor, pharmacist, or health care provider. °  °© 2016, [employer]. (2012-11-24 11:59:24) ° °

## 2015-07-05 LAB — BASIC METABOLIC PANEL
Anion gap: 9 (ref 5–15)
BUN: 20 mg/dL (ref 6–20)
CHLORIDE: 105 mmol/L (ref 101–111)
CO2: 24 mmol/L (ref 22–32)
Calcium: 8.6 mg/dL — ABNORMAL LOW (ref 8.9–10.3)
Creatinine, Ser: 1.22 mg/dL (ref 0.61–1.24)
GFR calc Af Amer: >60 mL/min (ref >60)
GFR calc non Af Amer: 53 mL/min — ABNORMAL LOW (ref >60)
Glucose, Bld: 113 mg/dL — ABNORMAL HIGH (ref 65–99)
POTASSIUM: 4.2 mmol/L (ref 3.5–5.1)
SODIUM: 138 mmol/L (ref 135–145)

## 2015-07-05 LAB — CBC
HCT: 35.5 % — ABNORMAL LOW (ref 39.0–52.0)
HEMOGLOBIN: 10.6 g/dL — AB (ref 13.0–17.0)
MCH: 23 pg — AB (ref 26.0–34.0)
MCHC: 29.9 g/dL — ABNORMAL LOW (ref 30.0–36.0)
MCV: 77 fL — AB (ref 78.0–100.0)
Platelets: 383 10*3/uL (ref 150–400)
RBC: 4.61 MIL/uL (ref 4.22–5.81)
RDW: 18.3 % — ABNORMAL HIGH (ref 11.5–15.5)
WBC: 9.1 10*3/uL (ref 4.0–10.5)

## 2015-07-05 LAB — MAGNESIUM: MAGNESIUM: 1.9 mg/dL (ref 1.7–2.4)

## 2015-07-05 MED ORDER — APIXABAN 5 MG PO TABS: 5.0000 mg | ORAL_TABLET | Freq: Two times a day (BID) | ORAL | Status: DC

## 2015-07-05 MED ORDER — FUROSEMIDE 40 MG PO TABS: 40.0000 mg | ORAL_TABLET | Freq: Every day | ORAL | Status: DC

## 2015-07-05 MED ORDER — METOPROLOL SUCCINATE ER 25 MG PO TB24: 25.0000 mg | ORAL_TABLET | Freq: Every day | ORAL | Status: DC

## 2015-07-05 MED ORDER — FERROUS SULFATE 325 (65 FE) MG PO TABS: 325.0000 mg | ORAL_TABLET | Freq: Every day | ORAL | Status: DC

## 2015-07-05 MED ORDER — DIGOXIN 125 MCG PO TABS: 0.1250 mg | ORAL_TABLET | Freq: Every day | ORAL | Status: DC

## 2015-07-05 MED ORDER — FUROSEMIDE 10 MG/ML IJ SOLN
40.0000 mg | Freq: Once | INTRAMUSCULAR | Status: AC
  Administered 2015-07-05: 40 mg via INTRAVENOUS
  Filled 2015-07-05: qty 4

## 2015-07-05 MED ORDER — APIXABAN 5 MG PO TABS: 10.0000 mg | ORAL_TABLET | Freq: Two times a day (BID) | ORAL | Status: DC

## 2015-07-05 MED ORDER — POLYETHYLENE GLYCOL 3350 17 G PO PACK: 17.0000 g | PACK | Freq: Every day | ORAL | Status: DC | PRN

## 2015-07-05 NOTE — Progress Notes (Signed)
[facility] PROGRESS NOTE  Leon Freeman PTW:[id number] DOB: Aug 19, 1931 DOA: 06/30/2015 PCP: ADDIS,DANIEL, DO  HPI/Brief narrative 82 y.o. male with hx of CAD/CABG/ porcine AVR 2009, R CEA < 2009, CM EF ~20% f/b cardiology in [place], [place]. Pt presented with episodes of passing out. During w/u, pt was noted to have acute R peroneal DVT.  Assessment/Plan: Increased lethargy -Initially suspected to be related to beta blocker, thus was d/c'd -Cardiology was consulted. Pt remains mildly tachycardic. Per cardiology, recs to given another dose of IV digoxin and if remains tachycardic in AM, then increase PO digoxin to 0.25mg  and increase Toprol to 37.5mg  -He had a recent GI bleed but his hemoglobin remains stable, he does have severe systolic CHF with known ejection fraction of 20%.  Acute lower extremity DVT -Anticoagulation situation is tricky given his recent GI bleed.  -He has been started on a low bolus dose IV heparin. -VQ scan shows low probability for PE. -Hgb has remained stable. Discussed with GI and OK to start PO AC. Have transitioned to eliquis. Per CM, is $30/month  -Tolerating eliquis  Elevated troponin -Likely represents demand ischemia. -EKG does not have acute ischemic abnormalities. -Troponins decreasing. -Cardiology is following  Bioprosthetic Aortic valve stenosis -Cardiology to refer to TAVR physicians  Acutely decompensated chronic systolic CHF - PO lasix started by Cardiology - Follow up CXR with B effusions per my own read despite doing well off O2. Will cautiously give IV lasix today and repeat CXR in AM, cautious to avoid over-diuresis   Code Status: Full Family Communication: Pt in room, family at bedside Disposition Plan: Possible d/c in 24hrs   Consultants:  Cardiology  GI  Procedures:    Antibiotics: Anti-infectives    None      HPI/Subjective: Very eager to go home today  Objective: Filed Vitals:   07/04/15 1401 07/04/15 1737  07/05/15 0530 07/05/15 1118  BP: 109/67 109/76 117/63 104/60  Pulse: 101 60 67 80  Temp: 98.5 F (36.9 C) 98.8 F (37.1 C) 98.8 F (37.1 C)   TempSrc: Oral Oral Oral   Resp: 18 19 18    Height:      Weight:      SpO2: 95% 92% 92%     Intake/Output Summary (Last 24 hours) at 07/05/15 1422 Last data filed at 07/04/15 1849  Gross per 24 hour  Intake    240 ml  Output      0 ml  Net    240 ml   Filed Weights   07/02/15 0500 07/03/15 0500 07/04/15 0444  Weight: 81.102 kg (178 lb 12.8 oz) 80.967 kg (178 lb 8 oz) 77.474 kg (170 lb 12.8 oz)    Exam:   General:  Awake, in nad, sitting in chair  Cardiovascular: regular, s1, s2  Respiratory: normal resp effort, no wheezing  Abdomen: soft,nondistended, pos BS  Musculoskeletal: perfused, no cyanosis  Data Reviewed: Basic Metabolic Panel:  Recent Labs Lab 06/30/15 1134 07/03/15 0609 07/04/15 0633 07/05/15 0658  NA 134* 140 140 138  K 4.3 3.8 3.2* 4.2  CL 104 107 108 105  CO2 21* 21* 23 24  GLUCOSE 132* 124* 131* 113*  BUN 33* 22* 24* 20  CREATININE 1.40* 1.13 1.23 1.22  CALCIUM 8.8* 8.8* 8.5* 8.6*  MG  --   --   --  1.9   Liver Function Tests: No results for input(s): AST, ALT, ALKPHOS, BILITOT, PROT, ALBUMIN in the last 168 hours. No results for input(s): LIPASE, AMYLASE in the  last 168 hours. No results for input(s): AMMONIA in the last 168 hours. CBC:  Recent Labs Lab 07/01/15 0327 07/02/15 6203 07/03/15 0609 07/04/15 0633 07/05/15 0658  WBC 9.1 10.6* 10.3 8.5 9.1  HGB 8.8* 9.5* 9.5* 9.7* 10.6*  HCT 28.4* 31.1* 31.6* 32.4* 35.5*  MCV 76.8* 76.6* 77.8* 77.7* 77.0*  PLT 258 339 334 318 383   Cardiac Enzymes:  Recent Labs Lab 06/30/15 1325 07/01/15 0327  TROPONINI 0.33* 0.25*   BNP (last 3 results) No results for input(s): BNP in the last 8760 hours.  ProBNP (last 3 results) No results for input(s): PROBNP in the last 8760 hours.  CBG: No results for input(s): GLUCAP in the last 168  hours.  No results found for this or any previous visit (from the past 240 hour(s)).   Studies: Dg Chest Port 1 View  07/04/2015  CLINICAL DATA:  82 year old male with CHF and shortness of breath. EXAM: PORTABLE CHEST 1 VIEW COMPARISON:  07/03/2015 and prior radiographs FINDINGS: Cardiomegaly and CABG changes noted. Pulmonary vascular congestion again identified. Bilateral lower lung consolidation/atelectasis and probable bilateral pleural effusions now identified. There is no evidence of pneumothorax. IMPRESSION: New bilateral lower lung consolidation/atelectasis and probable bilateral pleural effusions. These lower lung opacities may represent edema but infection not excluded. Cardiomegaly and pulmonary vascular congestion. Electronically Signed   By: Harmon Pier M.D.   On: 07/04/2015 19:09    Scheduled Meds: . apixaban  10 mg Oral BID   Followed by  . [START ON 07/10/2015] apixaban  5 mg Oral BID  . aspirin EC  81 mg Oral Daily  . atorvastatin  80 mg Oral Daily  . calcium carbonate  1 tablet Oral Daily  . cholecalciferol  2,000 Units Oral Daily  . digoxin  0.5 mg Intravenous Once  . digoxin  0.125 mg Oral Daily  . ferrous sulfate  325 mg Oral Q breakfast  . furosemide  40 mg Intravenous Once  . furosemide  40 mg Oral Daily  . metoprolol succinate  25 mg Oral Daily  . pantoprazole  40 mg Oral Daily  . predniSONE  5 mg Oral Q breakfast  . sodium chloride flush  3 mL Intravenous Q12H  . vitamin B-12  1,000 mcg Oral Daily  . 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 If 7PM-7AM, please contact night-coverage at [email or web address], password [facility] 07/05/2015, 2:22 PM  LOS: 5 days

## 2015-07-05 NOTE — Discharge Summary (Signed)
Physician Discharge Summary  Leon Freeman WUJ:[id number] DOB: September 19, 1931 DOA: 06/30/2015  PCP: ADDIS,DANIEL, DO  Admit date: 06/30/2015 Discharge date: 07/06/2015  Time spent: 20 minutes  Recommendations for Outpatient Follow-up:  1. Follow up with PCP in 2-3 weeks 2. 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 Briefly, 82 y.o. male with hx of CAD/CABG/ porcine AVR 2009, R CEA < 2009, CM EF ~20% f/b cardiology in [place], [place]. Pt presented with episodes of passing out. During w/u, pt was noted to have acute R peroneal DVT.  Hospital Course:  Increased lethargy -Initially suspected to be related to beta blocker, thus was d/c'd initially -Cardiology was consulted with recs to cont on digoxin and continued toprol. Pt initially tachycardic, but HR normalized -He had a recent GI bleed but his hemoglobin remained stable, he does have severe systolic CHF with known ejection fraction of 20%.  Acute lower extremity DVT -Anticoagulation situation is tricky given his recent GI bleed.  -He was initially continued on a low bolus dose IV heparin. -VQ scan shows low probability for PE. -Hgb has remained stable and patient was transitioned to eliquis. Per CM, is $30/month   -Continues to tolerate eliquis  Elevated troponin -Likely represents demand ischemia. -EKG without acute ischemic abnormalities. -Troponins were decreasing. -Cardiology had been  Bioprosthetic Aortic valve stenosis -Cardiology to refer to TAVR physicians as outpatient  Acutely decompensated chronic systolic CHF - EF 40-45% on 3/28 2d Echo - PO lasix started by Cardiology - Follow up CXR with B effusions per my own read despite doing well off O2. Patient was given IV lasix doses with interval improvement in effusions. Cautious to avoid over-diuresis. Patient to continue daily PO lasix per Cardiology on discharge  Consultations:  GI  Cardiology  Discharge Exam: Filed Vitals:   07/05/15 0530 07/05/15 1118 07/05/15 1431 07/06/15 1334  BP: 117/63 104/60 102/61 106/47  Pulse: 67 80 86 88  Temp: 98.8 F (37.1 C)  98.2 F (36.8 C) 97.8 F (36.6 C)  TempSrc: Oral   Oral  Resp: 18   18  Height:      Weight:      SpO2: 92%  93% 95%    General: Awake, ambulatory, in nad Cardiovascular: regular, s1, s2 Respiratory: normal resp effort, no wheezing  Discharge Instructions     Medication List    TAKE these medications        apixaban 5 MG Tabs tablet  Commonly known as:  ELIQUIS  Take 2 tablets (10 mg total) by mouth 2 (two) times daily.     apixaban 5 MG Tabs tablet  Commonly known as:  ELIQUIS  Take 1 tablet (5 mg  total) by mouth 2 (two) times daily.  Start taking on:  07/11/2015     aspirin EC 81 MG tablet  Take 1 tablet (81 mg total) by mouth daily.     atorvastatin 80 MG tablet  Commonly known as:  LIPITOR  Take 80 mg by mouth daily.     BIOTIN PO  Take 1 tablet by mouth daily.     BIOTINEX PO  Take 10,000 mcg by mouth daily.     calcium gluconate 500 MG tablet  Take 1 tablet by mouth daily.     clindamycin 300 MG capsule  Commonly known as:  CLEOCIN  Take 300 mg by mouth See admin instructions. Patient will takes 2 by mouth prior to dental  treatment.     digoxin 0.125 MG tablet  Commonly known as:  LANOXIN  Take 1 tablet (0.125 mg total) by mouth daily.     ferrous sulfate 325 (65 FE) MG tablet  Take 1 tablet (325 mg total) by mouth daily with breakfast.     furosemide 40 MG tablet  Commonly known as:  LASIX  Take 1 tablet (40 mg total) by mouth daily.     GLUCOSAMINE 1500 COMPLEX Caps  Take 1 capsule by mouth daily.     LIPO-FLAVONOID PLUS Tabs  Take 2 tablets by mouth daily.     metoprolol succinate 25 MG 24 hr tablet  Commonly known as:  TOPROL-XL  Take 1 tablet (25 mg total) by mouth daily.     METROLOTION 0.75 % Lotn  Generic drug:  METRONIDAZOLE (TOPICAL)  Apply topically daily.     omeprazole 40 MG capsule  Commonly known as:  PRILOSEC  Take 40 mg by mouth daily.     polyethylene glycol packet  Commonly known as:  MIRALAX / GLYCOLAX  Take 17 g by mouth daily as needed for mild constipation.     predniSONE 5 MG tablet  Commonly known as:  DELTASONE  Take 5 mg by mouth daily with breakfast.     vitamin B-12 1000 MCG tablet  Commonly known as:  CYANOCOBALAMIN  Take 1,000 mcg by mouth daily.     vitamin C 500 MG tablet  Commonly known as:  ASCORBIC ACID  Take 500 mg by mouth daily.     Vitamin D 2000 units Caps  Take 1 capsule by mouth daily.     Zinc 25 MG Tabs  Take 1 tablet by mouth daily.       Allergies  Allergen Reactions  . Penicillins Itching    Has patient had a PCN reaction causing immediate rash, facial/tongue/throat swelling, SOB or lightheadedness with hypotension: Yes Has patient had a PCN reaction causing severe rash involving mucus membranes or skin necrosis: No Has patient had a PCN reaction that required hospitalization No Has patient had a PCN reaction occurring within the last 10 years: No If all of the above answers are "NO", then may proceed with Cephalosporin use.    . Plaquenil [Hydroxychloroquine Sulfate] Itching and Other (See Comments)    redness    Follow-up Information    Follow up with ADDIS,DANIEL, DO. Schedule an appointment as soon as possible for a visit in 2 weeks.   Specialty:  Family Medicine   Why:  Hospital follow up   Contact information:   [address] [place] [address] [phone]        The results of significant diagnostics from this hospitalization (including imaging, microbiology, ancillary and laboratory) are listed below  for reference.    Significant Diagnostic Studies: Dg Chest 2 View  06/30/2015  CLINICAL DATA:  Loss of consciousness at home. EXAM: CHEST  2 VIEW COMPARISON:  September 06, 2010. FINDINGS: Stable cardiomediastinal silhouette. Status post coronary artery bypass graft. Status post aortic valve replacement. No pneumothorax or pleural effusion is noted. No acute pulmonary disease is noted. Bony thorax is unremarkable. IMPRESSION: No active cardiopulmonary disease. Electronically Signed   By: Lupita Raider, M.D.   On: 06/30/2015 14:30   Dg Forearm Left  06/30/2015  CLINICAL DATA:  Recent fall with left forearm bruising, initial encounter EXAM: LEFT FOREARM - 2 VIEW COMPARISON:  None. FINDINGS: No acute fracture or dislocation is noted. Degenerative changes are noted about the wrist joint. No gross soft tissue abnormality is seen. IMPRESSION: No acute abnormality noted. Electronically Signed   By: Alcide Clever M.D.   On: 06/30/2015 14:33   Ct Head Wo Contrast  06/30/2015  CLINICAL DATA:  Recent episode of loss of consciousness. EXAM: CT HEAD WITHOUT CONTRAST TECHNIQUE: Contiguous axial images were obtained from the base of the skull through the vertex without intravenous contrast. COMPARISON:  None. FINDINGS: There is moderate diffuse atrophy with ventricles in proportion relatively larger than the sulci. There is no intracranial mass, hemorrhage, extra-axial fluid collection, or midline shift. There is mild patchy small vessel disease in the centra semiovale bilaterally. There is asymmetric decreased  attenuation in the anterior right corona radiata, consistent with focal age uncertain white matter infarct. Elsewhere gray-white compartments appear normal. The bony calvarium appears intact. The mastoid air cells are clear. No intraorbital lesions are apparent. 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 Elsewhere there is atrophy with mild periventricular small vessel disease. No hemorrhage or mass effect. Note that the ventricles in proportion are slightly larger than the sulci. Question early normal pressure hydrocephalus superimposed on atrophy. Electronically Signed   By: Bretta Bang III M.D.   On: 06/30/2015 13:55   US Carotid Bilateral  06/30/2015  CLINICAL DATA:  Syncope versus narcolepsy. Hypertension, hyperlipidemia, previous tobacco abuse, coronary disease EXAM: BILATERAL CAROTID DUPLEX ULTRASOUND TECHNIQUE: Wallace Cullens scale imaging, color Doppler and duplex ultrasound was performed of bilateral carotid and vertebral arteries in the neck. COMPARISON:  None. REVIEW OF SYSTEMS: Quantification of carotid stenosis is based on velocity parameters that correlate the residual internal carotid diameter with NASCET-based stenosis levels, using the diameter of the distal internal carotid lumen as the denominator for stenosis measurement. The following velocity measurements were obtained: PEAK SYSTOLIC/END DIASTOLIC RIGHT ICA:                     96/38cm/sec CCA:                     90/27cm/sec SYSTOLIC ICA/CCA RATIO:  1.1 DIASTOLIC ICA/CCA RATIO: 1.6 ECA:                     50cm/sec LEFT ICA:                     210/68cm/sec CCA:                     100/24cm/sec SYSTOLIC ICA/CCA RATIO:  2.3 DIASTOLIC ICA/CCA RATIO: 2.6 ECA:                     231cm/sec FINDINGS: RIGHT CAROTID ARTERY: Eccentric partially calcified nonocclusive plaque in  the mid and distal common carotid artery and bulb extending to the origin of the ICA. No high-grade stenosis. Normal  waveforms and color Doppler signal. RIGHT VERTEBRAL ARTERY:  Normal flow direction and waveform. LEFT CAROTID ARTERY: Eccentric nonocclusive plaque in the mid common carotid artery. More extensive circumferential partially calcified plaque in the carotid bulb extending into proximal internal and external carotid arteries resulting in at least mild stenosis. There are elevated peak systolic velocities in the proximal external carotid artery and mid ICA distal to the plaque. LEFT VERTEBRAL ARTERY: Normal flow direction and waveform. IMPRESSION: 1. Bilateral carotid bifurcation and proximal ICA plaque, resulting in less than 50% diameter stenosis on the right, 50-69% diameter stenosis on the left. The exam does not exclude plaque ulceration or embolization. Continued surveillance recommended. Electronically Signed   By: Corlis Leak M.D.   On: 06/30/2015 17:37   Nm Pulmonary Perf And Vent  06/30/2015  CLINICAL DATA:  Syncopal events. EXAM: NUCLEAR MEDICINE VENTILATION - PERFUSION LUNG SCAN TECHNIQUE: Ventilation images were obtained in multiple projections using inhaled aerosol Tc-3m DTPA. Perfusion images were obtained in multiple projections after intravenous injection of Tc-69m MAA. RADIOPHARMACEUTICALS:  33 mCi Technetium-79m DTPA aerosol inhalation and 3.3 mCi Technetium-29m MAA IV COMPARISON:  Chest radiograph from earlier today. FINDINGS: Ventilation: Mildly heterogeneous ventilation throughout both lungs, suggesting airways disease. Perfusion: No unmatched perfusion defects. There is a single moderate matched perfusion defect in the right upper lobe. IMPRESSION: Low probability for pulmonary embolism (10-19%). Electronically Signed   By: Delbert Phenix M.D.   On: 06/30/2015 19:02   US Venous Img Lower Bilateral  06/30/2015  CLINICAL DATA:  Bilateral lower extremity edema x8 months, right worse than left. EXAM: Bilateral LOWER EXTREMITY VENOUS DOPPLER ULTRASOUND TECHNIQUE: Gray-scale sonography with  compression, as well as color and duplex ultrasound, were performed to evaluate the deep venous system from the level of the common femoral vein through the popliteal and proximal calf veins. COMPARISON:  None FINDINGS: On the right, Normal compressibility of the common femoral, superficial femoral, and popliteal veins. There is a distended thrombosed peroneal vein which is noncompressible with no flow signal on color Doppler. On the left, Normal compressibility of the common femoral, superficial femoral, and popliteal veins, as well as the proximal calf veins. No filling defects to suggest DVT on grayscale or color Doppler imaging. Doppler waveforms show normal direction of venous flow, normal respiratory phasicity and response to augmentation. IMPRESSION: 1. POSITIVE for isolated right peroneal vein (calf) DVT. 2. No left lower extremity DVT. These results will be called to the ordering clinician or representative by the Radiologist Assistant, and communication documented in the PACS or zVision Dashboard. Electronically Signed   By: Corlis Leak M.D.   On: 06/30/2015 17:33   Dg Chest Port 1 View  07/06/2015  CLINICAL DATA:  82 year old male with CHF. EXAM: PORTABLE CHEST 1 VIEW COMPARISON:  07/04/2015 FINDINGS: Cardiomegaly CABG changes again noted. Decreased bilateral lower lung airspace disease noted. There is no evidence of pneumothorax. Small effusions are not excluded. No acute bony abnormalities are identified. IMPRESSION: Decreased bilateral lower lung airspace disease/edema. Electronically Signed   By: Harmon Pier M.D.   On: 07/06/2015 09:32   Dg Chest Port 1 View  07/04/2015  CLINICAL DATA:  82 year old male with CHF and shortness of breath. EXAM: PORTABLE CHEST 1 VIEW COMPARISON:  07/03/2015 and prior radiographs FINDINGS: Cardiomegaly and CABG changes noted. Pulmonary vascular congestion again identified. Bilateral lower lung consolidation/atelectasis and probable bilateral pleural effusions  now  identified. There is no evidence of pneumothorax. IMPRESSION: New bilateral lower lung consolidation/atelectasis and probable bilateral pleural effusions. These lower lung opacities may represent edema but infection not excluded. Cardiomegaly and pulmonary vascular congestion. Electronically Signed   By: Harmon Pier M.D.   On: 07/04/2015 19:09   Dg Chest Port 1 View  07/03/2015  CLINICAL DATA:  Congestive heart failure. EXAM: PORTABLE CHEST 1 VIEW COMPARISON:  June 30, 2015. FINDINGS: Stable cardiomegaly is noted. Status post coronary artery bypass graft. No pneumothorax is noted. Increased central pulmonary vascular congestion is noted with probable bilateral perihilar and basilar pulmonary edema. Minimal left pleural effusion is noted. Narrowing of the subacromial space is noted bilaterally suggesting rotator cuff injury. Possible developing right upper lobe pneumonia or atelectasis is noted. IMPRESSION: Increased central pulmonary vascular congestion is noted with probable bilateral perihilar and basilar pulmonary edema. Minimal left pleural effusion is noted. Possible developing right upper lobe pneumonia or atelectasis is noted. Electronically Signed   By: Lupita Raider, M.D.   On: 07/03/2015 07:49    Microbiology: No results found for this or any previous visit (from the past 240 hour(s)).   Labs: Basic Metabolic Panel:  Recent Labs Lab 06/30/15 1134 07/03/15 0609 07/04/15 0633 07/05/15 0658 07/06/15 0645  NA 134* 140 140 138 137  K 4.3 3.8 3.2* 4.2 4.2  CL 104 107 108 105 104  CO2 21* 21* 23 24 25   GLUCOSE 132* 124* 131* 113* 117*  BUN 33* 22* 24* 20 23*  CREATININE 1.40* 1.13 1.23 1.22 1.15  CALCIUM 8.8* 8.8* 8.5* 8.6* 8.7*  MG  --   --   --  1.9  --    Liver Function Tests: No results for input(s): AST, ALT, ALKPHOS, BILITOT, PROT, ALBUMIN in the last 168 hours. No results for input(s): LIPASE, AMYLASE in the last 168 hours. No results for input(s): AMMONIA in the last 168  hours. CBC:  Recent Labs Lab 07/02/15 0632 07/03/15 0609 07/04/15 0633 07/05/15 0658 07/06/15 0645  WBC 10.6* 10.3 8.5 9.1 8.8  HGB 9.5* 9.5* 9.7* 10.6* 10.1*  HCT 31.1* 31.6* 32.4* 35.5* 33.4*  MCV 76.6* 77.8* 77.7* 77.0* 76.3*  PLT 339 334 318 383 344   Cardiac Enzymes:  Recent Labs Lab 06/30/15 1325 07/01/15 0327  TROPONINI 0.33* 0.25*   BNP: BNP (last 3 results) No results for input(s): BNP in the last 8760 hours.  ProBNP (last 3 results) No results for input(s): PROBNP in the last 8760 hours.  CBG: No results for input(s): GLUCAP in the last 168 hours.   Signed:  Elder Davidian, Scheryl Marten  [facility] 07/06/2015, 5:08 PM

## 2015-07-06 ENCOUNTER — Inpatient Hospital Stay (HOSPITAL_COMMUNITY): Payer: Medicare Other

## 2015-07-06 LAB — BASIC METABOLIC PANEL
Anion gap: 8 (ref 5–15)
BUN: 23 mg/dL — ABNORMAL HIGH (ref 6–20)
CALCIUM: 8.7 mg/dL — AB (ref 8.9–10.3)
CHLORIDE: 104 mmol/L (ref 101–111)
CO2: 25 mmol/L (ref 22–32)
CREATININE: 1.15 mg/dL (ref 0.61–1.24)
GFR calc non Af Amer: 57 mL/min — ABNORMAL LOW (ref >60)
GLUCOSE: 117 mg/dL — AB (ref 65–99)
Potassium: 4.2 mmol/L (ref 3.5–5.1)
SODIUM: 137 mmol/L (ref 135–145)

## 2015-07-06 LAB — CBC
HCT: 33.4 % — ABNORMAL LOW (ref 39.0–52.0)
HEMOGLOBIN: 10.1 g/dL — AB (ref 13.0–17.0)
MCH: 23.1 pg — ABNORMAL LOW (ref 26.0–34.0)
MCHC: 30.2 g/dL (ref 30.0–36.0)
MCV: 76.3 fL — ABNORMAL LOW (ref 78.0–100.0)
Platelets: 344 10*3/uL (ref 150–400)
RBC: 4.38 MIL/uL (ref 4.22–5.81)
RDW: 18.3 % — AB (ref 11.5–15.5)
WBC: 8.8 10*3/uL (ref 4.0–10.5)

## 2015-07-06 MED ORDER — APIXABAN 5 MG PO TABS: 10.0000 mg | ORAL_TABLET | Freq: Two times a day (BID) | ORAL | Status: DC

## 2015-07-06 NOTE — Progress Notes (Signed)
ANTICOAGULATION CONSULT NOTE   Pharmacy Consult for Apixaban Indication: DVT   Recent Labs  07/04/15 0633 07/05/15 0658 07/06/15 0645  HGB 9.7* 10.6* 10.1*  HCT 32.4* 35.5* 33.4*  PLT 318 383 344  CREATININE 1.23 1.22 1.15    Estimated Creatinine Clearance: 46.3 mL/min (by C-G formula based on Cr of 1.15).   Medical History: Past Medical History  Diagnosis Date  . Anemia   . Coronary artery disease      Scheduled:  . apixaban  10 mg Oral BID   Followed by  . [START ON 07/10/2015] apixaban  5 mg Oral BID  . aspirin EC  81 mg Oral Daily  . atorvastatin  80 mg Oral Daily  . calcium carbonate  1 tablet Oral Daily  . cholecalciferol  2,000 Units Oral Daily  . digoxin  0.5 mg Intravenous Once  . digoxin  0.125 mg Oral Daily  . ferrous sulfate  325 mg Oral Q breakfast  . furosemide  40 mg Oral Daily  . metoprolol succinate  25 mg Oral Daily  . pantoprazole  40 mg Oral Daily  . predniSONE  5 mg Oral Q breakfast  . sodium chloride flush  3 mL Intravenous Q12H  . vitamin B-12  1,000 mcg Oral Daily  . vitamin C  500 mg Oral Daily    Assessment: Pharmacy consulted to dose and monitor apixaban in this 82 year old male with a history of CAD/CABG/porcine AVR and currently found to have an acute Rt peroneal DVT.  Hg stable  Goal of Therapy:  Monitor platelets by anticoagulation protocol: Yes   Plan:  Continue apixaban   Talbert Cage, PharmD Clinical Pharmacist

## 2015-07-07 NOTE — Care Management (Signed)
[phone].

## 2015-07-14 ENCOUNTER — Telehealth: Payer: Self-pay | Admitting: Adult Health

## 2015-07-14 ENCOUNTER — Ambulatory Visit (INDEPENDENT_AMBULATORY_CARE_PROVIDER_SITE_OTHER): Payer: Medicare Other | Admitting: Adult Health

## 2015-07-14 ENCOUNTER — Other Ambulatory Visit (HOSPITAL_COMMUNITY)
Admission: RE | Admit: 2015-07-14 | Discharge: 2015-07-14 | Disposition: A | Discharge status: Home / Self Care | Payer: Medicare Other | Source: Ambulatory Visit | Attending: Adult Health | Admitting: Adult Health

## 2015-07-14 ENCOUNTER — Encounter: Payer: Self-pay | Admitting: Adult Health

## 2015-07-14 VITALS — BP 110/76 | HR 87 | Ht 67.0 in | Wt 171.0 lb

## 2015-07-14 DIAGNOSIS — I351 Nonrheumatic aortic (valve) insufficiency: Secondary | ICD-10-CM | POA: Diagnosis not present

## 2015-07-14 DIAGNOSIS — R55 Syncope and collapse: Secondary | ICD-10-CM

## 2015-07-14 DIAGNOSIS — I82402 Acute embolism and thrombosis of unspecified deep veins of left lower extremity: Secondary | ICD-10-CM

## 2015-07-14 DIAGNOSIS — D500 Iron deficiency anemia secondary to blood loss (chronic): Secondary | ICD-10-CM

## 2015-07-14 DIAGNOSIS — Z79899 Other long term (current) drug therapy: Secondary | ICD-10-CM | POA: Insufficient documentation

## 2015-07-14 LAB — CBC WITH DIFFERENTIAL/PLATELET
BASOS ABS: 0 10*3/uL (ref 0.0–0.1)
BASOS PCT: 0 %
EOS ABS: 0.1 10*3/uL (ref 0.0–0.7)
EOS PCT: 1 %
HEMATOCRIT: 34.4 % — AB (ref 39.0–52.0)
Hemoglobin: 10.3 g/dL — ABNORMAL LOW (ref 13.0–17.0)
Lymphocytes Relative: 15 %
Lymphs Abs: 1.1 10*3/uL (ref 0.7–4.0)
MCH: 23.5 pg — ABNORMAL LOW (ref 26.0–34.0)
MCHC: 29.9 g/dL — AB (ref 30.0–36.0)
MCV: 78.4 fL (ref 78.0–100.0)
MONO ABS: 0.4 10*3/uL (ref 0.1–1.0)
MONOS PCT: 5 %
NEUTROS ABS: 5.6 10*3/uL (ref 1.7–7.7)
Neutrophils Relative %: 79 %
PLATELETS: 425 10*3/uL — AB (ref 150–400)
RBC: 4.39 MIL/uL (ref 4.22–5.81)
RDW: 19.9 % — AB (ref 11.5–15.5)
WBC: 7.1 10*3/uL (ref 4.0–10.5)

## 2015-07-14 LAB — BASIC METABOLIC PANEL
ANION GAP: 8 (ref 5–15)
BUN: 21 mg/dL — AB (ref 6–20)
CALCIUM: 8.7 mg/dL — AB (ref 8.9–10.3)
CO2: 27 mmol/L (ref 22–32)
CREATININE: 1.29 mg/dL — AB (ref 0.61–1.24)
Chloride: 100 mmol/L — ABNORMAL LOW (ref 101–111)
GFR calc Af Amer: 58 mL/min — ABNORMAL LOW (ref >60)
GFR, EST NON AFRICAN AMERICAN: 50 mL/min — AB (ref >60)
GLUCOSE: 117 mg/dL — AB (ref 65–99)
Potassium: 4.5 mmol/L (ref 3.5–5.1)
Sodium: 135 mmol/L (ref 135–145)

## 2015-07-14 MED ORDER — METOPROLOL SUCCINATE ER 25 MG PO TB24: ORAL_TABLET | ORAL | Status: DC

## 2015-07-14 MED ORDER — APIXABAN 5 MG PO TABS: 5.0000 mg | ORAL_TABLET | Freq: Two times a day (BID) | ORAL | Status: DC

## 2015-07-14 NOTE — Progress Notes (Deleted)
Name: Leon Freeman    DOB: 1931-11-28  Age: 82 y.o.  MR#: [id number]       PCP:  ADDIS,DANIEL, DO      Insurance: Payor: MEDICARE / Plan: MEDICARE PART A AND B / Product Type: *No Product type* /   CC:   No chief complaint on file.   VS Filed Vitals:   07/14/15 1510  BP: 110/76  Pulse: 87  Height:  (1.702 m)  Weight: 171 lb (77.565 kg)  SpO2: 95%    Weights Current Weight  07/14/15 171 lb (77.565 kg)  07/04/15 170 lb 12.8 oz (77.474 kg)  06/27/15 183 lb 8 oz (83.235 kg)    Blood Pressure  BP Readings from Last 3 Encounters:  07/14/15 110/76  07/06/15 106/47  06/28/15 137/90     Admit date:  (Not on file) Last encounter with RMR:  Visit date not found   Allergy Penicillins and Plaquenil  Current Outpatient Prescriptions  Medication Sig Dispense Refill  . apixaban (ELIQUIS) 5 MG TABS tablet Take 1 tablet (5 mg total) by mouth 2 (two) times daily. 60 tablet 0  . aspirin EC 81 MG tablet Take 1 tablet (81 mg total) by mouth daily.    Marland Kitchen BIOTIN PO Take 1 tablet by mouth daily.    . calcium gluconate 500 MG tablet Take 1 tablet by mouth daily.    . Cholecalciferol (VITAMIN D) 2000 units CAPS Take 1 capsule by mouth daily.     . clindamycin (CLEOCIN) 300 MG capsule Take 300 mg by mouth See admin instructions. Patient will takes 2 by mouth prior to dental treatment.    . digoxin (LANOXIN) 0.125 MG tablet Take 1 tablet (0.125 mg total) by mouth daily. 30 tablet 0  . ezetimibe (ZETIA) 10 MG tablet Take 10 mg by mouth at bedtime.  11  . ferrous sulfate 325 (65 FE) MG tablet Take 1 tablet (325 mg total) by mouth daily with breakfast. 30 tablet 0  . furosemide (LASIX) 40 MG tablet Take 1 tablet (40 mg total) by mouth daily. 30 tablet 0  . Glucosamine-Chondroit-Vit C-Mn (GLUCOSAMINE 1500 COMPLEX) CAPS Take 1 capsule by mouth daily.     . Lactobacillus (BIOTINEX PO) Take 10,000 mcg by mouth daily.    . metoprolol succinate (TOPROL-XL) 25 MG 24 hr tablet Take 1 tablet (25 mg total)  by mouth daily. 30 tablet 0  . METRONIDAZOLE, TOPICAL, (METROLOTION) 0.75 % LOTN Apply topically daily.    Marland Kitchen omeprazole (PRILOSEC) 40 MG capsule Take 40 mg by mouth daily.    . polyethylene glycol (MIRALAX / GLYCOLAX) packet Take 17 g by mouth daily as needed for mild constipation. 14 each 0  . predniSONE (DELTASONE) 5 MG tablet Take 5 mg by mouth daily with breakfast.    . simvastatin (ZOCOR) 40 MG tablet Take 40 mg by mouth daily.    . vitamin B-12 (CYANOCOBALAMIN) 1000 MCG tablet Take 1,000 mcg by mouth daily.    . vitamin C (ASCORBIC ACID) 500 MG tablet Take 500 mg by mouth daily.    . Vitamins-Lipotropics (LIPO-FLAVONOID PLUS) TABS Take 2 tablets by mouth daily.    . Zinc 25 MG TABS Take 1 tablet by mouth daily.     Marland Kitchen apixaban (ELIQUIS) 5 MG TABS tablet Take 2 tablets (10 mg total) by mouth 2 (two) times daily. 10 tablet 0   No current facility-administered medications for this visit.    Discontinued Meds:    Medications Discontinued During This  Encounter  Medication Reason  . atorvastatin (LIPITOR) 80 MG tablet Error    Patient Active Problem List   Diagnosis Date Noted  . Acute CHF (congestive heart failure) (HCC) 07/03/2015  . Demand ischemia (HCC)   . Acute blood loss anemia   . Syncope 06/30/2015  . Anemia 06/30/2015  . History of CEA (carotid endarterectomy) right  06/30/2015  . Elevated troponin   . Fall   . Excessive sleepiness   . Rheumatoid arthritis (HCC) 06/27/2015  . 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 Hx of CABG x 4 2009 06/26/2015  . S/P AVR (aortic valve replacement) porcine 2009 06/26/2015  . Hypertension 06/26/2015  . GERD (gastroesophageal reflux disease) 06/26/2015  . History of gastric ulcer 06/26/2015  . Anemia, iron deficiency 06/26/2015  . Heme positive stool 06/26/2015    LABS    Component Value Date/Time   NA 137 07/06/2015 0645   NA 138 07/05/2015 0658   NA 140 07/04/2015 0633   K 4.2 07/06/2015 0645   K 4.2  07/05/2015 0658   K 3.2* 07/04/2015 0633   CL 104 07/06/2015 0645   CL 105 07/05/2015 0658   CL 108 07/04/2015 0633   CO2 25 07/06/2015 0645   CO2 24 07/05/2015 0658   CO2 23 07/04/2015 0633   GLUCOSE 117* 07/06/2015 0645   GLUCOSE 113* 07/05/2015 0658   GLUCOSE 131* 07/04/2015 0633   BUN 23* 07/06/2015 0645   BUN 20 07/05/2015 0658   BUN 24* 07/04/2015 0633   CREATININE 1.15 07/06/2015 0645   CREATININE 1.22 07/05/2015 0658   CREATININE 1.23 07/04/2015 0633   CALCIUM 8.7* 07/06/2015 0645   CALCIUM 8.6* 07/05/2015 0658   CALCIUM 8.5* 07/04/2015 0633   GFRNONAA 57* 07/06/2015 0645   GFRNONAA 53* 07/05/2015 0658   GFRNONAA 53* 07/04/2015 0633   GFRAA >60 07/06/2015 0645   GFRAA >60 07/05/2015 0658   GFRAA >60 07/04/2015 0633   CMP     Component Value Date/Time   NA 137 07/06/2015 0645   K 4.2 07/06/2015 0645   CL 104 07/06/2015 0645   CO2 25 07/06/2015 0645   GLUCOSE 117* 07/06/2015 0645   BUN 23* 07/06/2015 0645   CREATININE 1.15 07/06/2015 0645   CALCIUM 8.7* 07/06/2015 0645   GFRNONAA 57* 07/06/2015 0645   GFRAA >60 07/06/2015 0645       Component Value Date/Time   WBC 8.8 07/06/2015 0645   WBC 9.1 07/05/2015 0658   WBC 8.5 07/04/2015 0633   HGB 10.1* 07/06/2015 0645   HGB 10.6* 07/05/2015 0658   HGB 9.7* 07/04/2015 0633   HCT 33.4* 07/06/2015 0645   HCT 35.5* 07/05/2015 0658   HCT 32.4* 07/04/2015 0633   MCV 76.3* 07/06/2015 0645   MCV 77.0* 07/05/2015 0658   MCV 77.7* 07/04/2015 0633    Lipid Panel  No results found for: CHOL, TRIG, HDL, CHOLHDL, VLDL, LDLCALC, LDLDIRECT  ABG No results found for: PHART, PCO2ART, PO2ART, HCO3, TCO2, ACIDBASEDEF, O2SAT   Lab Results  Component Value Date   TSH 1.199 07/01/2015   BNP (last 3 results) No results for input(s): BNP in the last 8760 hours.  ProBNP (last 3 results) No results for input(s): PROBNP in the last 8760 hours.  Cardiac Panel (last 3 results) No results for input(s): CKTOTAL, CKMB,  TROPONINI, RELINDX in the last 72 hours.  Iron/TIBC/Ferritin/ %Sat    Component Value Date/Time   IRON 10* 06/27/2015 1425   TIBC 445 06/27/2015  1425   FERRITIN 10* 06/27/2015 1425   IRONPCTSAT 2* 06/27/2015 1425     EKG Orders placed or performed during the hospital encounter of 06/30/15  . EKG 12-Lead  . EKG 12-Lead  . 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 EXAM: CHEST  2 VIEW COMPARISON:  September 06, 2010. FINDINGS: Stable cardiomediastinal silhouette. Status post coronary artery bypass graft. Status post aortic valve replacement. No pneumothorax or pleural effusion is noted. No acute pulmonary disease is noted. Bony thorax is unremarkable. IMPRESSION: No active cardiopulmonary disease. Electronically Signed   By: Lupita Raider, M.D.   On: 06/30/2015 14:30   Dg Forearm Left  06/30/2015  CLINICAL DATA:  Recent fall with left forearm bruising, initial encounter EXAM: LEFT FOREARM - 2 VIEW COMPARISON:  None. FINDINGS: No acute fracture or dislocation is noted. Degenerative changes are noted about the wrist joint. No gross soft tissue abnormality is seen. IMPRESSION: No acute abnormality noted. Electronically Signed   By: Alcide Clever M.D.   On: 06/30/2015 14:33   Ct Head Wo  Contrast  06/30/2015  CLINICAL DATA:  Recent episode of loss of consciousness. EXAM: CT HEAD WITHOUT CONTRAST TECHNIQUE: Contiguous axial images were obtained from the base of the skull through the vertex without intravenous contrast. COMPARISON:  None. FINDINGS: There is moderate diffuse atrophy with ventricles in proportion relatively larger than the sulci. There is no intracranial mass, hemorrhage, extra-axial fluid collection, or midline shift. There is mild patchy small vessel disease in the centra semiovale bilaterally. There is asymmetric decreased attenuation in the anterior right corona radiata, consistent with focal age uncertain white matter infarct. Elsewhere gray-white compartments appear normal. The bony calvarium appears intact. The mastoid air cells are clear. No intraorbital lesions are apparent. 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 Elsewhere there is atrophy with mild periventricular small vessel disease. No hemorrhage or mass effect. Note that the ventricles in proportion are slightly larger than the sulci. Question early normal pressure hydrocephalus superimposed on atrophy. Electronically Signed   By: Bretta Bang III M.D.   On: 06/30/2015 13:55   US Carotid Bilateral  06/30/2015  CLINICAL DATA:  Syncope versus narcolepsy. Hypertension, hyperlipidemia, previous tobacco abuse, coronary disease EXAM: BILATERAL CAROTID DUPLEX ULTRASOUND TECHNIQUE: Wallace Cullens scale imaging, color Doppler and duplex ultrasound was performed of bilateral carotid and vertebral arteries in the neck. COMPARISON:  None. REVIEW OF SYSTEMS: Quantification of carotid stenosis is based on velocity parameters that correlate the residual internal carotid diameter with NASCET-based stenosis levels, using the diameter of the distal internal carotid  lumen as the denominator for stenosis measurement. The following velocity measurements were obtained: PEAK SYSTOLIC/END  DIASTOLIC RIGHT ICA:                     96/38cm/sec CCA:                     90/27cm/sec SYSTOLIC ICA/CCA RATIO:  1.1 DIASTOLIC ICA/CCA RATIO: 1.6 ECA:                     50cm/sec LEFT ICA:                     210/68cm/sec CCA:                     100/24cm/sec SYSTOLIC ICA/CCA RATIO:  2.3 DIASTOLIC ICA/CCA RATIO: 2.6 ECA:                     231cm/sec FINDINGS: RIGHT CAROTID ARTERY: Eccentric partially calcified nonocclusive plaque in the mid and distal common carotid artery and bulb extending to the origin of the ICA. No high-grade stenosis. Normal waveforms and color Doppler signal. RIGHT VERTEBRAL ARTERY:  Normal flow direction and waveform. LEFT CAROTID ARTERY: Eccentric nonocclusive plaque in the mid common carotid artery. More extensive circumferential partially calcified plaque in the carotid bulb extending into proximal internal and external carotid arteries resulting in at least mild stenosis. There are elevated peak systolic velocities in the proximal external carotid artery and mid ICA distal to the plaque. LEFT VERTEBRAL ARTERY: Normal flow direction and waveform. IMPRESSION: 1. Bilateral carotid bifurcation and proximal ICA plaque, resulting in less than 50% diameter stenosis on the right, 50-69% diameter stenosis on the left. The exam does not exclude plaque ulceration or embolization. Continued surveillance recommended. Electronically Signed   By: Corlis Leak M.D.   On: 06/30/2015 17:37   Nm Pulmonary Perf And Vent  06/30/2015  CLINICAL DATA:  Syncopal events. EXAM: NUCLEAR MEDICINE VENTILATION - PERFUSION LUNG SCAN TECHNIQUE: Ventilation images were obtained in multiple projections using inhaled aerosol Tc-21m DTPA. Perfusion images were obtained in multiple projections after intravenous injection of Tc-98m MAA. RADIOPHARMACEUTICALS:  33 mCi Technetium-45m DTPA aerosol inhalation and 3.3 mCi Technetium-33m MAA IV COMPARISON:  Chest radiograph from earlier today. FINDINGS: Ventilation: Mildly  heterogeneous ventilation throughout both lungs, suggesting airways disease. Perfusion: No unmatched perfusion defects. There is a single moderate matched perfusion defect in the right upper lobe. IMPRESSION: Low probability for pulmonary embolism (10-19%). Electronically Signed   By: Delbert Phenix M.D.   On: 06/30/2015 19:02   US Venous Img Lower Bilateral  06/30/2015  CLINICAL DATA:  Bilateral lower extremity edema x8 months, right worse than left. EXAM: Bilateral LOWER EXTREMITY VENOUS DOPPLER ULTRASOUND TECHNIQUE: Gray-scale sonography with compression, as well as color and duplex ultrasound, were performed to evaluate the deep venous system from the level of the common femoral vein through the popliteal and proximal calf veins. COMPARISON:  None FINDINGS: On the right, Normal compressibility of the common femoral, superficial femoral, and popliteal veins. There is a distended thrombosed peroneal vein which is noncompressible with no flow signal on color Doppler. On the left, Normal compressibility of the common femoral, superficial femoral, and popliteal veins, as well as the proximal calf veins. No filling defects to suggest DVT on grayscale or color Doppler imaging. Doppler waveforms show normal direction of venous flow, normal respiratory phasicity and response to augmentation. IMPRESSION: 1. POSITIVE for  isolated right peroneal vein (calf) DVT. 2. No left lower extremity DVT. These results will be called to the ordering clinician or representative by the Radiologist Assistant, and communication documented in the PACS or zVision Dashboard. Electronically Signed   By: Corlis Leak M.D.   On: 06/30/2015 17:33   Dg Chest Port 1 View  07/06/2015  CLINICAL DATA:  82 year old male with CHF. EXAM: PORTABLE CHEST 1 VIEW COMPARISON:  07/04/2015 FINDINGS: Cardiomegaly CABG changes again noted. Decreased bilateral lower lung airspace disease noted. There is no evidence of pneumothorax. Small effusions are not  excluded. No acute bony abnormalities are identified. IMPRESSION: Decreased bilateral lower lung airspace disease/edema. Electronically Signed   By: Harmon Pier M.D.   On: 07/06/2015 09:32   Dg Chest Port 1 View  07/04/2015  CLINICAL DATA:  82 year old male with CHF and shortness of breath. EXAM: PORTABLE CHEST 1 VIEW COMPARISON:  07/03/2015 and prior radiographs FINDINGS: Cardiomegaly and CABG changes noted. Pulmonary vascular congestion again identified. Bilateral lower lung consolidation/atelectasis and probable bilateral pleural effusions now identified. There is no evidence of pneumothorax. IMPRESSION: New bilateral lower lung consolidation/atelectasis and probable bilateral pleural effusions. These lower lung opacities may represent edema but infection not excluded. Cardiomegaly and pulmonary vascular congestion. Electronically Signed   By: Harmon Pier M.D.   On: 07/04/2015 19:09   Dg Chest Port 1 View  07/03/2015  CLINICAL DATA:  Congestive heart failure. EXAM: PORTABLE CHEST 1 VIEW COMPARISON:  June 30, 2015. FINDINGS: Stable cardiomegaly is noted. Status post coronary artery bypass graft. No pneumothorax is noted. Increased central pulmonary vascular congestion is noted with probable bilateral perihilar and basilar pulmonary edema. Minimal left pleural effusion is noted. Narrowing of the subacromial space is noted bilaterally suggesting rotator cuff injury. Possible developing right upper lobe pneumonia or atelectasis is noted. IMPRESSION: Increased central pulmonary vascular congestion is noted with probable bilateral perihilar and basilar pulmonary edema. Minimal left pleural effusion is noted. Possible developing right upper lobe pneumonia or atelectasis is noted. Electronically Signed   By: Lupita Raider, M.D.   On: 07/03/2015 07:49

## 2015-07-14 NOTE — Telephone Encounter (Signed)
Patients daughter stopped and asked if he would get a new RX sent to the [facility] in [place], [place]?

## 2015-07-14 NOTE — Patient Instructions (Signed)
Medication Instructions:  Stop digoxin Increase Metoprolol to 37.5 mg daily ( 1 1/2 tablets daily)  Start Eliquis 5 mg two times daily  Labwork: Your physician recommends that you return for lab work in: Today CBC BMET   Testing/Procedures: NONE  Follow-Up: Your physician recommends that you schedule a follow-up appointment in: 2 WEEKS WITH KATHRYN LAWRENCE, N.P.    Any Other Special Instructions Will Be Listed Below (If Applicable).  NO DRIVING UNTIL FURTHER NOTICE    If you need a refill on your cardiac medications before your next appointment, please call your pharmacy.

## 2015-07-14 NOTE — Progress Notes (Signed)
Cardiology Office Note   Date:  07/14/2015   ID:  Leon Freeman, DOB 1931-12-05, MRN [id number]  PCP:  ADDIS,DANIEL, DO  Cardiologist: Inis Sizer, NP   No chief complaint on file.     History of Present Illness: Leon Freeman is a 82 y.o. male who presents for ongoing assessment and management of CAD, status post CABG x4 with porcine AVR at [name] in 2009.  He was recently admitted to the hospital in the setting of syncope and fall., " periods of falling asleep" while standing.echocardiogram was completed revealing moderate AS.  He was also found to be tachycardic during hospitalization.  He was given IV digoxin, times one followed by oral, digoxin daily, with consideration for reinstitution of outpatient metoprolol at low doses.  He was also given one dose of IV Lasix to 2 decompensated heart failure.  The patient's hospitalization was complicated by a DVT, Anemia, requiring blood transfusion, now on ELIQUIS 5 mg twice a day.  He was also to be considered for a TAVR do to aortic valve stenosis.  The patient was found to have decompensated heart failure with an EF of 40-45%.  His daughter is a Designer, jewellery at and he can hospital and comes with multiple questions.  The patient feels much better and wants to go back to the gym.  Has not had any further syncopal episodes and is anxious to return to his normal way of life. He denies chest pain, syncope, or dyspnea.he is tolerating ELIQUIS without complaints of bleeding or bruising.   Past Medical History  Diagnosis Date  . Anemia   . Coronary artery disease     Past Surgical History  Procedure Laterality Date  . Cardiac valve surgery  2009  . Trigger finger release Left   . Leg surgery    . Colonoscopy  2015    Dr.Spainhour  . Upper gastrointestinal endoscopy  2016    Dr.Spainhour  . Appendectomy    . Eye surgery Left   . Coronary artery bypass graft    . Esophagogastroduodenoscopy N/A  06/27/2015    Procedure: ESOPHAGOGASTRODUODENOSCOPY (EGD);  Surgeon: Leon Hippo, MD;  [facility]: [facility];  Service: Endoscopy;  Laterality: N/A;  255     Current Outpatient Prescriptions  Medication Sig Dispense Refill  . apixaban (ELIQUIS) 5 MG TABS tablet Take 1 tablet (5 mg total) by mouth 2 (two) times daily. 60 tablet 0  . aspirin EC 81 MG tablet Take 1 tablet (81 mg total) by mouth daily.    Marland Kitchen BIOTIN PO Take 1 tablet by mouth daily.    . calcium gluconate 500 MG tablet Take 1 tablet by mouth daily.    . Cholecalciferol (VITAMIN D) 2000 units CAPS Take 1 capsule by mouth daily.     . clindamycin (CLEOCIN) 300 MG capsule Take 300 mg by mouth See admin instructions. Patient will takes 2 by mouth prior to dental treatment.    . digoxin (LANOXIN) 0.125 MG tablet Take 1 tablet (0.125 mg total) by mouth daily. 30 tablet 0  . ezetimibe (ZETIA) 10 MG tablet Take 10 mg by mouth at bedtime.  11  . ferrous sulfate 325 (65 FE) MG tablet Take 1 tablet (325 mg total) by mouth daily with breakfast. 30 tablet 0  . furosemide (LASIX) 40 MG tablet Take 1 tablet (40 mg total) by mouth daily. 30 tablet 0  . Glucosamine-Chondroit-Vit C-Mn (GLUCOSAMINE 1500 COMPLEX) CAPS Take 1 capsule by mouth daily.     Marland Kitchen  Lactobacillus (BIOTINEX PO) Take 10,000 mcg by mouth daily.    . metoprolol succinate (TOPROL-XL) 25 MG 24 hr tablet Take 1 tablet (25 mg total) by mouth daily. 30 tablet 0  . METRONIDAZOLE, TOPICAL, (METROLOTION) 0.75 % LOTN Apply topically daily.    Marland Kitchen omeprazole (PRILOSEC) 40 MG capsule Take 40 mg by mouth daily.    . polyethylene glycol (MIRALAX / GLYCOLAX) packet Take 17 g by mouth daily as needed for mild constipation. 14 each 0  . predniSONE (DELTASONE) 5 MG tablet Take 5 mg by mouth daily with breakfast.    . simvastatin (ZOCOR) 40 MG tablet Take 40 mg by mouth daily.    . vitamin B-12 (CYANOCOBALAMIN) 1000 MCG tablet Take 1,000 mcg by mouth daily.    . vitamin C (ASCORBIC ACID) 500 MG  tablet Take 500 mg by mouth daily.    . Vitamins-Lipotropics (LIPO-FLAVONOID PLUS) TABS Take 2 tablets by mouth daily.    . Zinc 25 MG TABS Take 1 tablet by mouth daily.     Marland Kitchen apixaban (ELIQUIS) 5 MG TABS tablet Take 2 tablets (10 mg total) by mouth 2 (two) times daily. 10 tablet 0   No current facility-administered medications for this visit.    Allergies:   Penicillins and Plaquenil    Social History:  The patient  reports that he quit smoking about 40 years ago. He has never used smokeless tobacco. He reports that he does not drink alcohol.   Family History:  The patient's family history includes Bipolar disorder in his son; Dementia in his mother; Healthy in his daughter; Heart disease in his father.    ROS: All other systems are reviewed and negative. Unless otherwise mentioned in H&P    PHYSICAL EXAM: VS:  BP 110/76 mmHg  Pulse 87  Ht 5\' 7"  (1.702 m)  Wt 171 lb (77.565 kg)  BMI 26.78 kg/m2  SpO2 95% , BMI Body mass index is 26.78 kg/(m^2). GEN: Well nourished, well developed, in no acute distress HEENT: normal Neck: no JVD, carotid bruits, or masses Cardiac: RRR; no murmurs, rubs, or gallops,no edema  Respiratory:  clear to auscultation bilaterally, normal work of breathing GI: soft, nontender, nondistended, + BS MS: no deformity or atrophy Skin: warm and dry, no rash Neuro:  Strength and sensation are intact Psych: euthymic mood, full affect   EKG:   The ekg ordered today demonstrates right bundle branch block, with left axis bifascicular block, heart rate of 74 beats per minute.  Echocardiogram: 07/01/2015 Left ventricle: The cavity size was normal. Wall thickness was  increased in a pattern of mild LVH. Systolic function was mildly  to moderately reduced. The estimated ejection fraction was in the  range of 40% to 45%. Diffuse hypokinesis. Doppler parameters are  consistent with abnormal left ventricular relaxation (grade 1  diastolic dysfunction). Doppler  parameters are consistent with  high ventricular filling pressure. - Aortic valve: There appears to be bioprosthetic valve stenosis  with reduced leaflet excursion. Peak velocities and mean  gradients are elevated for this type of bioprosthetic valve. Peak  velocity (S): 359 cm/s. Mean gradient (S): 34 mm Hg. Valve area  (VTI): 0.77 cm^2. Valve area (Vmax): 0.78 cm^2. Valve area  (Vmean): 0.73 cm^2. - Mitral valve: Calcified annulus. Mildly thickened leaflets .  There was mild regurgitation. - Left atrium: The atrium was severely dilated. - Right ventricle: Systolic function was mildly reduced. - Right atrium: The atrium was mildly dilated. - Tricuspid valve: There was mild-moderate regurgitation. -  Pulmonary arteries: Systolic pressure was mildly increased. PA  peak pressure: 41 mm Hg (S). - Systemic veins: IVC dilated with normal respiratory variation.  Estimated CVP 8 mmHg.  Recent Labs: 07/01/2015: TSH 1.199 07/05/2015: Magnesium 1.9 07/06/2015: BUN 23*; Creatinine, Ser 1.15; Hemoglobin 10.1*; Platelets 344; Potassium 4.2; Sodium 137    Lipid Panel No results found for: CHOL, TRIG, HDL, CHOLHDL, VLDL, LDLCALC, LDLDIRECT    Wt Readings from Last 3 Encounters:  07/14/15 171 lb (77.565 kg)  07/04/15 170 lb 12.8 oz (77.474 kg)  06/27/15 183 lb 8 oz (83.235 kg)     ASSESSMENT AND PLAN:  1. Syncopal episode: Multifactorial in the setting of aortic valve stenosis, and anemia.  The patient is feeling better.  Has not had any further complaints.  He is advised not to drive until aortic valve is repaired.  2. DVT: isolated right peroneal vein of calf. He continues on ELIQUIS 5 mg twice a day.  This will delay surgery TAVR for approximately 6 months.  This has been explained to the patient.  3. Severe aortic valve stenosis of bioprosthetic valve:he will be evaluated for TAVR after 6 months of anticoagulation therapy due to DVT.  I will take the patient off digoxin, dated to age  and decreased renal function, and increase his metoprolol to 37.5 mg daily for heart rate control.  We will consider decreasing Lasix to avoid decreased preload.  Currently, the patient appears well compensated, and therefore, no other changes will be made at this time.  4. Status post AVM: the patient was seen by GI with a clipping of AVM.  He is due to follow up to discuss cauterization.as he is on ELIQUIS, would not discontinue for a minimum of 6 months due to DVT.  Followup CBC and BMET are ordered.   Current medicines are reviewed at length with the patient today.    Labs/ tests ordered today include: CBC and BMET. No orders of the defined types were placed in this encounter.     Disposition:   FU with 2 weeks.  Signed, Joni Reining, NP  07/14/2015 3:34 PM     [facility] [address], [place], [place] [address] Phone: [phone]; Fax: [phone]

## 2015-07-14 NOTE — Telephone Encounter (Signed)
rx escribed  As requested

## 2015-07-15 ENCOUNTER — Telehealth: Payer: Self-pay

## 2015-07-15 ENCOUNTER — Other Ambulatory Visit: Payer: Self-pay

## 2015-07-15 DIAGNOSIS — Z79899 Other long term (current) drug therapy: Secondary | ICD-10-CM

## 2015-07-15 MED ORDER — FUROSEMIDE 20 MG PO TABS: 30.0000 mg | ORAL_TABLET | Freq: Every day | ORAL | Status: DC

## 2015-07-15 NOTE — Telephone Encounter (Signed)
Pt made aware of lab results, agreed to repeat labs in 1 week. Understands to decrease lasix to 30 mg daily. (1 1/2 tablets)

## 2015-07-15 NOTE — Telephone Encounter (Signed)
Discontinued 40 mg of lasix. Sent in a new rx for 20 mg ( take 1 1/2 tablets = 30 mg daiyl) [facility] in [place], [place]. Gave a copy of lab work to Daughter, mailed lab slip to pt home.

## 2015-07-15 NOTE — Telephone Encounter (Signed)
-----   Message from Jodelle Gross, NP sent at 07/15/2015  7:05 AM EDT ----- Decrease Lasix to 20 mg 1 and 1/2 tablets (total of 30 mg daily), from 40 mg daily. Repeat BMET in one week.

## 2015-07-22 ENCOUNTER — Other Ambulatory Visit: Payer: Self-pay | Admitting: *Deleted

## 2015-07-22 ENCOUNTER — Other Ambulatory Visit (HOSPITAL_COMMUNITY)
Admission: RE | Admit: 2015-07-22 | Discharge: 2015-07-22 | Disposition: A | Discharge status: Home / Self Care | Payer: Medicare Other | Source: Ambulatory Visit | Attending: Adult Health | Admitting: Adult Health

## 2015-07-22 DIAGNOSIS — I10 Essential (primary) hypertension: Secondary | ICD-10-CM | POA: Diagnosis present

## 2015-07-22 LAB — BASIC METABOLIC PANEL
ANION GAP: 8 (ref 5–15)
BUN: 19 mg/dL (ref 6–20)
CHLORIDE: 104 mmol/L (ref 101–111)
CO2: 26 mmol/L (ref 22–32)
Calcium: 8.5 mg/dL — ABNORMAL LOW (ref 8.9–10.3)
Creatinine, Ser: 1.19 mg/dL (ref 0.61–1.24)
GFR calc non Af Amer: 55 mL/min — ABNORMAL LOW (ref >60)
Glucose, Bld: 142 mg/dL — ABNORMAL HIGH (ref 65–99)
POTASSIUM: 4.3 mmol/L (ref 3.5–5.1)
Sodium: 138 mmol/L (ref 135–145)

## 2015-07-28 ENCOUNTER — Encounter: Payer: Self-pay | Admitting: Adult Health

## 2015-07-28 ENCOUNTER — Ambulatory Visit (INDEPENDENT_AMBULATORY_CARE_PROVIDER_SITE_OTHER): Payer: Medicare Other | Admitting: Adult Health

## 2015-07-28 VITALS — BP 124/68 | HR 89 | Ht 62.0 in | Wt 172.0 lb

## 2015-07-28 DIAGNOSIS — I82402 Acute embolism and thrombosis of unspecified deep veins of left lower extremity: Secondary | ICD-10-CM

## 2015-07-28 DIAGNOSIS — I350 Nonrheumatic aortic (valve) stenosis: Secondary | ICD-10-CM | POA: Diagnosis not present

## 2015-07-28 NOTE — Patient Instructions (Signed)
Medication Instructions:  Your physician recommends that you continue on your current medications as directed. Please refer to the Current Medication list given to you today.   Labwork: NONE  Testing/Procedures: NONE  Follow-Up: Your physician recommends that you schedule a follow-up appointment in: 3 MONTHS    Any Other Special Instructions Will Be Listed Below (If Applicable).     If you need a refill on your cardiac medications before your next appointment, please call your pharmacy.   

## 2015-07-28 NOTE — Progress Notes (Signed)
Cardiology Office Note   Date:  07/28/2015   ID:  Kipper Buch, DOB Dec 18, 1931, MRN [id number]  PCP:  ADDIS,DANIEL, DO  Cardiologist:  Inis Sizer, NP   Chief Complaint  Patient presents with  . Congestive Heart Failure  . Cardiac Valve Problem    Sever stenosis      History of Present Illness: Leon Freeman is a 82 y.o. male who presents for ongoing assessment and management of coronary artery disease, status post CABG x4 with a porcine AVR, at [facility] 2009. The patient was last seen in the office on 07/14/2015, with history of syncope, decompensated CHF with an EF of 40-45%. He is no longer on Coumadin but is on ELIQUIS 5 mg twice a day. He was to be considered for a TAVR but had to remain on anticoagulation for a minimum of 6 months due to recent history of DVT. Followup CBC and BMET were ordered. Consideration for medication adjustments based on results. Digoxin was also discontinued. Metoprolol was increased to 37.5 mg daily for heart rate control.  Results completed on 07/22/2015 revealed a sodium of 138 potassium 4.3 chloride 104 CO2 28 BUN 19 creatinine 1.19. Hemoglobin and hematocrit on 07/14/2015 revealed a hemoglobin of 10.3 hematocrit 34.4. Platelets are 425.  Today feeling much better on adjusted medications. Denies any bleeding. Lower extremity edema. He wants to return to the gym to walk. His wife is with him states that she's notices his energy is better.  Echocardiogram 07/01/2015 Left ventricle: The cavity size was normal. Wall thickness was  increased in a pattern of mild LVH. Systolic function was mildly  to moderately reduced. The estimated ejection fraction was in the  range of 40% to 45%. Diffuse hypokinesis. Doppler parameters are  consistent with abnormal left ventricular relaxation (grade 1  diastolic dysfunction). Doppler parameters are consistent with  high ventricular filling pressure. - Aortic valve: There appears  to be bioprosthetic valve stenosis  with reduced leaflet excursion. Peak velocities and mean  gradients are elevated for this type of bioprosthetic valve. Peak  velocity (S): 359 cm/s. Mean gradient (S): 34 mm Hg. Valve area  (VTI): 0.77 cm^2. Valve area (Vmax): 0.78 cm^2. Valve area  (Vmean): 0.73 cm^2. - Mitral valve: Calcified annulus. Mildly thickened leaflets .  There was mild regurgitation. - Left atrium: The atrium was severely dilated. - Right ventricle: Systolic function was mildly reduced. - Right atrium: The atrium was mildly dilated. - Tricuspid valve: There was mild-moderate regurgitation. - Pulmonary arteries: Systolic pressure was mildly increased. PA  peak pressure: 41 mm Hg (S). - Systemic veins: IVC dilated with normal respiratory variation.  Estimated CVP 8 mmHg.  Past Medical History  Diagnosis Date  . Anemia   . Coronary artery disease     Past Surgical History  Procedure Laterality Date  . Cardiac valve surgery  2009  . Trigger finger release Left   . Leg surgery    . Colonoscopy  2015    Dr.Spainhour  . Upper gastrointestinal endoscopy  2016    Dr.Spainhour  . Appendectomy    . Eye surgery Left   . Coronary artery bypass graft    . Esophagogastroduodenoscopy N/A 06/27/2015    Procedure: ESOPHAGOGASTRODUODENOSCOPY (EGD);  Surgeon: Malissa Hippo, MD;  Location: [facility];  Service: Endoscopy;  Laterality: N/A;  255     Current Outpatient Prescriptions  Medication Sig Dispense Refill  . apixaban (ELIQUIS) 5 MG TABS tablet Take 1 tablet (5 mg total)  by mouth 2 (two) times daily. 60 tablet 3  . aspirin EC 81 MG tablet Take 1 tablet (81 mg total) by mouth daily.    Marland Kitchen BIOTIN PO Take 1 tablet by mouth daily.    . calcium gluconate 500 MG tablet Take 1 tablet by mouth daily.    . Cholecalciferol (VITAMIN D) 2000 units CAPS Take 1 capsule by mouth daily.     . clindamycin (CLEOCIN) 300 MG capsule Take 300 mg by mouth See admin instructions.  Patient will takes 2 by mouth prior to dental treatment.    Marland Kitchen ezetimibe (ZETIA) 10 MG tablet Take 10 mg by mouth at bedtime.  11  . ferrous sulfate 325 (65 FE) MG tablet Take 1 tablet (325 mg total) by mouth daily with breakfast. 30 tablet 0  . furosemide (LASIX) 20 MG tablet Take 1.5 tablets (30 mg total) by mouth daily. 45 tablet 3  . Glucosamine-Chondroit-Vit C-Mn (GLUCOSAMINE 1500 COMPLEX) CAPS Take 1 capsule by mouth daily.     . Lactobacillus (BIOTINEX PO) Take 10,000 mcg by mouth daily.    . metoprolol succinate (TOPROL-XL) 25 MG 24 hr tablet Take 1 1/2 tablets daily 45 tablet 3  . METRONIDAZOLE, TOPICAL, (METROLOTION) 0.75 % LOTN Apply topically daily.    Marland Kitchen omeprazole (PRILOSEC) 40 MG capsule Take 40 mg by mouth daily.    . polyethylene glycol (MIRALAX / GLYCOLAX) packet Take 17 g by mouth daily as needed for mild constipation. 14 each 0  . predniSONE (DELTASONE) 5 MG tablet Take 5 mg by mouth daily with breakfast.    . simvastatin (ZOCOR) 40 MG tablet Take 40 mg by mouth daily.    . vitamin B-12 (CYANOCOBALAMIN) 1000 MCG tablet Take 1,000 mcg by mouth daily.    . vitamin C (ASCORBIC ACID) 500 MG tablet Take 500 mg by mouth daily.    . Vitamins-Lipotropics (LIPO-FLAVONOID PLUS) TABS Take 2 tablets by mouth daily.    . Zinc 25 MG TABS Take 1 tablet by mouth daily.      No current facility-administered medications for this visit.    Allergies:   Penicillins and Plaquenil    Social History:  The patient  reports that he quit smoking about 40 years ago. He has never used smokeless tobacco. He reports that he does not drink alcohol.   Family History:  The patient's family history includes Bipolar disorder in his son; Dementia in his mother; Healthy in his daughter; Heart disease in his father.    ROS: All other systems are reviewed and negative. Unless otherwise mentioned in H&P    PHYSICAL EXAM: VS:  BP 124/68 mmHg  Pulse 89  Ht 5\' 2"  (1.575 m)  Wt 172 lb (78.019 kg)  BMI  31.45 kg/m2  SpO2 94% , BMI Body mass index is 31.45 kg/(m^2). GEN: Well nourished, well developed, in no acute distress HEENT: normal Neck: no JVD, carotid bruits, or masses Cardiac: RRR; 3/6 holosystolic murmur,, rubs, or gallops,no edema  Respiratory:  clear to auscultation bilaterally, normal work of breathing GI: soft, nontender, nondistended, + BS MS: no deformity or atrophy Skin: warm and dry, no rash Neuro:  Strength and sensation are intact Psych: euthymic mood, full affect    Recent Labs: 07/01/2015: TSH 1.199 07/05/2015: Magnesium 1.9 07/14/2015: Hemoglobin 10.3*; Platelets 425* 07/22/2015: BUN 19; Creatinine, Ser 1.19; Potassium 4.3; Sodium 138    Lipid Panel No results found for: CHOL, TRIG, HDL, CHOLHDL, VLDL, LDLCALC, LDLDIRECT    Wt Readings from Last  3 Encounters:  07/28/15 172 lb (78.019 kg)  07/14/15 171 lb (77.565 kg)  07/04/15 170 lb 12.8 oz (77.474 kg)     ASSESSMENT AND PLAN:  1. History of DVT right peroneal vein.: The patient remains on ELIQUIS5 mg twice a day. This will continue for another 4 months. We'll consider taking him off of it at that time.  2. Severe aortic valve stenosis.the patient is planned for referral to surgery for TAVR after anticoagulation regimen for DVT. We'll see him again in 3 months for discussion of referral. In the interim heart rate and blood pressure are well controlled he is asymptomatic and feeling much better. I've advised him if he becomes symptomatic concerning chest pain and dyspnea weakness or syncope he is to call us. We may need to proceed with aortic valve repair sooner.  3. Chronic systolic CHF: most recent echocardiogram 07/01/2015 revealed ejection fraction 40-45% with diffuse hypokinesis and wall thickness increased in a pattern of mild LVH.   Current medicines are reviewed at length with the patient today.    Labs/ tests ordered today include:  No orders of the defined types were placed in this encounter.      Disposition:   FU with 3 months with Dr. Purvis Sheffield   Signed, Joni Reining, NP  07/28/2015 4:34 PM    [facility] [address], [place], [place] [address] Phone: [phone]; Fax: [phone]

## 2015-07-28 NOTE — Progress Notes (Signed)
Name: Leon Freeman    DOB: 10-Nov-1931  Age: 82 y.o.  MR#: [id number]       PCP:  ADDIS,DANIEL, DO      Insurance: Payor: MEDICARE / Plan: MEDICARE PART A AND B / Product Type: *No Product type* /   CC:   No chief complaint on file.   VS Filed Vitals:   07/28/15 1502  BP: 124/68  Pulse: 89  Height: 5\' 2"  (1.575 m)  Weight: 172 lb (78.019 kg)  SpO2: 94%    Weights Current Weight  07/28/15 172 lb (78.019 kg)  07/14/15 171 lb (77.565 kg)  07/04/15 170 lb 12.8 oz (77.474 kg)    Blood Pressure  BP Readings from Last 3 Encounters:  07/28/15 124/68  07/14/15 110/76  07/06/15 106/47     Admit date:  (Not on file) Last encounter with RMR:  07/14/2015   Allergy Penicillins and Plaquenil  Current Outpatient Prescriptions  Medication Sig Dispense Refill  . apixaban (ELIQUIS) 5 MG TABS tablet Take 1 tablet (5 mg total) by mouth 2 (two) times daily. 60 tablet 3  . aspirin EC 81 MG tablet Take 1 tablet (81 mg total) by mouth daily.    Marland Kitchen BIOTIN PO Take 1 tablet by mouth daily.    . calcium gluconate 500 MG tablet Take 1 tablet by mouth daily.    . Cholecalciferol (VITAMIN D) 2000 units CAPS Take 1 capsule by mouth daily.     . clindamycin (CLEOCIN) 300 MG capsule Take 300 mg by mouth See admin instructions. Patient will takes 2 by mouth prior to dental treatment.    Marland Kitchen ezetimibe (ZETIA) 10 MG tablet Take 10 mg by mouth at bedtime.  11  . ferrous sulfate 325 (65 FE) MG tablet Take 1 tablet (325 mg total) by mouth daily with breakfast. 30 tablet 0  . furosemide (LASIX) 20 MG tablet Take 1.5 tablets (30 mg total) by mouth daily. 45 tablet 3  . Glucosamine-Chondroit-Vit C-Mn (GLUCOSAMINE 1500 COMPLEX) CAPS Take 1 capsule by mouth daily.     . Lactobacillus (BIOTINEX PO) Take 10,000 mcg by mouth daily.    . metoprolol succinate (TOPROL-XL) 25 MG 24 hr tablet Take 1 1/2 tablets daily 45 tablet 3  . METRONIDAZOLE, TOPICAL, (METROLOTION) 0.75 % LOTN Apply topically daily.    Marland Kitchen omeprazole  (PRILOSEC) 40 MG capsule Take 40 mg by mouth daily.    . polyethylene glycol (MIRALAX / GLYCOLAX) packet Take 17 g by mouth daily as needed for mild constipation. 14 each 0  . predniSONE (DELTASONE) 5 MG tablet Take 5 mg by mouth daily with breakfast.    . simvastatin (ZOCOR) 40 MG tablet Take 40 mg by mouth daily.    . vitamin B-12 (CYANOCOBALAMIN) 1000 MCG tablet Take 1,000 mcg by mouth daily.    . vitamin C (ASCORBIC ACID) 500 MG tablet Take 500 mg by mouth daily.    . Vitamins-Lipotropics (LIPO-FLAVONOID PLUS) TABS Take 2 tablets by mouth daily.    . Zinc 25 MG TABS Take 1 tablet by mouth daily.      No current facility-administered medications for this visit.    Discontinued Meds:   There are no discontinued medications.  Patient Active Problem List   Diagnosis Date Noted  . Acute CHF (congestive heart failure) (HCC) 07/03/2015  . Demand ischemia (HCC)   . Acute blood loss anemia   . Syncope 06/30/2015  . Anemia 06/30/2015  . History of CEA (carotid endarterectomy) right  06/30/2015  .  Elevated troponin   . Fall   . Excessive sleepiness   . Rheumatoid arthritis (HCC) 06/27/2015  . 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 Hx of CABG x 4 2009 06/26/2015  . S/P AVR (aortic valve replacement) porcine 2009 06/26/2015  . Hypertension 06/26/2015  . GERD (gastroesophageal reflux disease) 06/26/2015  . History of gastric ulcer 06/26/2015  . Anemia, iron deficiency 06/26/2015  . Heme positive stool 06/26/2015    LABS    Component Value Date/Time   NA 138 07/22/2015 1444   NA 135 07/14/2015 1620   NA 137 07/06/2015 0645   K 4.3 07/22/2015 1444   K 4.5 07/14/2015 1620   K 4.2 07/06/2015 0645   CL 104 07/22/2015 1444   CL 100* 07/14/2015 1620   CL 104 07/06/2015 0645   CO2 26 07/22/2015 1444   CO2 27 07/14/2015 1620   CO2 25 07/06/2015 0645   GLUCOSE 142* 07/22/2015 1444   GLUCOSE 117* 07/14/2015 1620   GLUCOSE 117* 07/06/2015 0645   BUN 19 07/22/2015  1444   BUN 21* 07/14/2015 1620   BUN 23* 07/06/2015 0645   CREATININE 1.19 07/22/2015 1444   CREATININE 1.29* 07/14/2015 1620   CREATININE 1.15 07/06/2015 0645   CALCIUM 8.5* 07/22/2015 1444   CALCIUM 8.7* 07/14/2015 1620   CALCIUM 8.7* 07/06/2015 0645   GFRNONAA 55* 07/22/2015 1444   GFRNONAA 50* 07/14/2015 1620   GFRNONAA 57* 07/06/2015 0645   GFRAA >60 07/22/2015 1444   GFRAA 58* 07/14/2015 1620   GFRAA >60 07/06/2015 0645   CMP     Component Value Date/Time   NA 138 07/22/2015 1444   K 4.3 07/22/2015 1444   CL 104 07/22/2015 1444   CO2 26 07/22/2015 1444   GLUCOSE 142* 07/22/2015 1444   BUN 19 07/22/2015 1444   CREATININE 1.19 07/22/2015 1444   CALCIUM 8.5* 07/22/2015 1444   GFRNONAA 55* 07/22/2015 1444   GFRAA >60 07/22/2015 1444       Component Value Date/Time   WBC 7.1 07/14/2015 1620   WBC 8.8 07/06/2015 0645   WBC 9.1 07/05/2015 0658   HGB 10.3* 07/14/2015 1620   HGB 10.1* 07/06/2015 0645   HGB 10.6* 07/05/2015 0658   HCT 34.4* 07/14/2015 1620   HCT 33.4* 07/06/2015 0645   HCT 35.5* 07/05/2015 0658   MCV 78.4 07/14/2015 1620   MCV 76.3* 07/06/2015 0645   MCV 77.0* 07/05/2015 0658    Lipid Panel  No results found for: CHOL, TRIG, HDL, CHOLHDL, VLDL, LDLCALC, LDLDIRECT  ABG No results found for: PHART, PCO2ART, PO2ART, HCO3, TCO2, ACIDBASEDEF, O2SAT   Lab Results  Component Value Date   TSH 1.199 07/01/2015   BNP (last 3 results) No results for input(s): BNP in the last 8760 hours.  ProBNP (last 3 results) No results for input(s): PROBNP in the last 8760 hours.  Cardiac Panel (last 3 results) No results for input(s): CKTOTAL, CKMB, TROPONINI, RELINDX in the last 72 hours.  Iron/TIBC/Ferritin/ %Sat    Component Value Date/Time   IRON 10* 06/27/2015 1425   TIBC 445 06/27/2015 1425   FERRITIN 10* 06/27/2015 1425   IRONPCTSAT 2* 06/27/2015 1425     EKG Orders placed or performed in visit on 07/14/15  . 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 EXAM: CHEST  2 VIEW COMPARISON:  September 06, 2010. FINDINGS: Stable cardiomediastinal silhouette. Status post coronary artery bypass graft. Status post aortic valve replacement. No pneumothorax or pleural effusion is noted. No acute pulmonary disease is noted. Bony thorax is unremarkable. IMPRESSION: No active cardiopulmonary disease. Electronically Signed   By: Lupita Raider, M.D.   On: 06/30/2015 14:30   Dg Forearm Left  06/30/2015  CLINICAL DATA:  Recent fall with left forearm bruising, initial encounter EXAM: LEFT FOREARM - 2 VIEW COMPARISON:  None. FINDINGS: No acute fracture or dislocation is noted. Degenerative changes are noted about the wrist joint. No gross soft tissue abnormality is seen. IMPRESSION: No acute abnormality noted. Electronically Signed   By: Alcide Clever M.D.   On: 06/30/2015 14:33   Ct Head Wo Contrast  06/30/2015  CLINICAL DATA:  Recent episode of loss of consciousness. EXAM: CT HEAD WITHOUT CONTRAST TECHNIQUE: Contiguous axial images were obtained from the base of the skull through the vertex without intravenous contrast. COMPARISON:  None. FINDINGS: There is moderate diffuse atrophy with ventricles in proportion relatively larger than the sulci. There  is no intracranial mass, hemorrhage, extra-axial fluid collection, or midline shift. There is mild patchy small vessel disease in the centra semiovale bilaterally. There is asymmetric decreased attenuation in the anterior right corona radiata, consistent with focal age uncertain white matter infarct. Elsewhere gray-white compartments appear normal. The bony calvarium appears intact. The mastoid air cells are clear. No intraorbital lesions are apparent. 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 Elsewhere there is atrophy with mild periventricular small vessel disease. No hemorrhage or mass effect. Note that the ventricles in proportion are slightly larger than the sulci. Question early normal pressure hydrocephalus superimposed on atrophy. Electronically Signed   By: Bretta Bang III M.D.   On: 06/30/2015 13:55   US Carotid Bilateral  06/30/2015  CLINICAL DATA:  Syncope versus narcolepsy. Hypertension, hyperlipidemia, previous tobacco abuse, coronary disease EXAM: BILATERAL CAROTID DUPLEX ULTRASOUND TECHNIQUE: Wallace Cullens scale imaging, color Doppler and duplex ultrasound was performed of bilateral carotid and vertebral arteries in the neck. COMPARISON:  None. REVIEW OF SYSTEMS: Quantification of carotid stenosis is based on velocity parameters that correlate the residual internal carotid diameter with NASCET-based stenosis levels, using the diameter of the distal internal carotid lumen as the denominator for stenosis measurement. The following velocity measurements were obtained: PEAK SYSTOLIC/END DIASTOLIC RIGHT ICA:                     96/38cm/sec CCA:                     90/27cm/sec SYSTOLIC ICA/CCA RATIO:  1.1 DIASTOLIC ICA/CCA RATIO: 1.6 ECA:  50cm/sec LEFT ICA:                     210/68cm/sec CCA:                     100/24cm/sec SYSTOLIC ICA/CCA RATIO:  2.3 DIASTOLIC ICA/CCA RATIO: 2.6 ECA:                     231cm/sec FINDINGS: RIGHT  CAROTID ARTERY: Eccentric partially calcified nonocclusive plaque in the mid and distal common carotid artery and bulb extending to the origin of the ICA. No high-grade stenosis. Normal waveforms and color Doppler signal. RIGHT VERTEBRAL ARTERY:  Normal flow direction and waveform. LEFT CAROTID ARTERY: Eccentric nonocclusive plaque in the mid common carotid artery. More extensive circumferential partially calcified plaque in the carotid bulb extending into proximal internal and external carotid arteries resulting in at least mild stenosis. There are elevated peak systolic velocities in the proximal external carotid artery and mid ICA distal to the plaque. LEFT VERTEBRAL ARTERY: Normal flow direction and waveform. IMPRESSION: 1. Bilateral carotid bifurcation and proximal ICA plaque, resulting in less than 50% diameter stenosis on the right, 50-69% diameter stenosis on the left. The exam does not exclude plaque ulceration or embolization. Continued surveillance recommended. Electronically Signed   By: Corlis Leak M.D.   On: 06/30/2015 17:37   Nm Pulmonary Perf And Vent  06/30/2015  CLINICAL DATA:  Syncopal events. EXAM: NUCLEAR MEDICINE VENTILATION - PERFUSION LUNG SCAN TECHNIQUE: Ventilation images were obtained in multiple projections using inhaled aerosol Tc-65m DTPA. Perfusion images were obtained in multiple projections after intravenous injection of Tc-6m MAA. RADIOPHARMACEUTICALS:  33 mCi Technetium-76m DTPA aerosol inhalation and 3.3 mCi Technetium-51m MAA IV COMPARISON:  Chest radiograph from earlier today. FINDINGS: Ventilation: Mildly heterogeneous ventilation throughout both lungs, suggesting airways disease. Perfusion: No unmatched perfusion defects. There is a single moderate matched perfusion defect in the right upper lobe. IMPRESSION: Low probability for pulmonary embolism (10-19%). Electronically Signed   By: Delbert Phenix M.D.   On: 06/30/2015 19:02   US Venous Img Lower Bilateral  06/30/2015   CLINICAL DATA:  Bilateral lower extremity edema x8 months, right worse than left. EXAM: Bilateral LOWER EXTREMITY VENOUS DOPPLER ULTRASOUND TECHNIQUE: Gray-scale sonography with compression, as well as color and duplex ultrasound, were performed to evaluate the deep venous system from the level of the common femoral vein through the popliteal and proximal calf veins. COMPARISON:  None FINDINGS: On the right, Normal compressibility of the common femoral, superficial femoral, and popliteal veins. There is a distended thrombosed peroneal vein which is noncompressible with no flow signal on color Doppler. On the left, Normal compressibility of the common femoral, superficial femoral, and popliteal veins, as well as the proximal calf veins. No filling defects to suggest DVT on grayscale or color Doppler imaging. Doppler waveforms show normal direction of venous flow, normal respiratory phasicity and response to augmentation. IMPRESSION: 1. POSITIVE for isolated right peroneal vein (calf) DVT. 2. No left lower extremity DVT. These results will be called to the ordering clinician or representative by the Radiologist Assistant, and communication documented in the PACS or zVision Dashboard. Electronically Signed   By: Corlis Leak M.D.   On: 06/30/2015 17:33   Dg Chest Port 1 View  07/06/2015  CLINICAL DATA:  82 year old male with CHF. EXAM: PORTABLE CHEST 1 VIEW COMPARISON:  07/04/2015 FINDINGS: Cardiomegaly CABG changes again noted. Decreased bilateral lower lung airspace disease noted. There is no  evidence of pneumothorax. Small effusions are not excluded. No acute bony abnormalities are identified. IMPRESSION: Decreased bilateral lower lung airspace disease/edema. Electronically Signed   By: Harmon Pier M.D.   On: 07/06/2015 09:32   Dg Chest Port 1 View  07/04/2015  CLINICAL DATA:  82 year old male with CHF and shortness of breath. EXAM: PORTABLE CHEST 1 VIEW COMPARISON:  07/03/2015 and prior radiographs FINDINGS:  Cardiomegaly and CABG changes noted. Pulmonary vascular congestion again identified. Bilateral lower lung consolidation/atelectasis and probable bilateral pleural effusions now identified. There is no evidence of pneumothorax. IMPRESSION: New bilateral lower lung consolidation/atelectasis and probable bilateral pleural effusions. These lower lung opacities may represent edema but infection not excluded. Cardiomegaly and pulmonary vascular congestion. Electronically Signed   By: Harmon Pier M.D.   On: 07/04/2015 19:09   Dg Chest Port 1 View  07/03/2015  CLINICAL DATA:  Congestive heart failure. EXAM: PORTABLE CHEST 1 VIEW COMPARISON:  June 30, 2015. FINDINGS: Stable cardiomegaly is noted. Status post coronary artery bypass graft. No pneumothorax is noted. Increased central pulmonary vascular congestion is noted with probable bilateral perihilar and basilar pulmonary edema. Minimal left pleural effusion is noted. Narrowing of the subacromial space is noted bilaterally suggesting rotator cuff injury. Possible developing right upper lobe pneumonia or atelectasis is noted. IMPRESSION: Increased central pulmonary vascular congestion is noted with probable bilateral perihilar and basilar pulmonary edema. Minimal left pleural effusion is noted. Possible developing right upper lobe pneumonia or atelectasis is noted. Electronically Signed   By: Lupita Raider, M.D.   On: 07/03/2015 07:49

## 2015-07-29 ENCOUNTER — Other Ambulatory Visit: Payer: Self-pay | Admitting: Adult Health

## 2015-08-04 ENCOUNTER — Ambulatory Visit (INDEPENDENT_AMBULATORY_CARE_PROVIDER_SITE_OTHER): Payer: Medicare Other | Admitting: Adult Health

## 2015-08-04 ENCOUNTER — Other Ambulatory Visit (HOSPITAL_COMMUNITY)
Admission: RE | Admit: 2015-08-04 | Discharge: 2015-08-04 | Disposition: A | Discharge status: Home / Self Care | Payer: Medicare Other | Source: Ambulatory Visit | Attending: Adult Health | Admitting: Adult Health

## 2015-08-04 ENCOUNTER — Encounter: Payer: Self-pay | Admitting: Adult Health

## 2015-08-04 VITALS — BP 104/58 | HR 90 | Ht 67.0 in | Wt 174.0 lb

## 2015-08-04 DIAGNOSIS — M79606 Pain in leg, unspecified: Secondary | ICD-10-CM | POA: Insufficient documentation

## 2015-08-04 LAB — CBC WITH DIFFERENTIAL/PLATELET
BASOS PCT: 1 %
Basophils Absolute: 0.1 10*3/uL (ref 0.0–0.1)
EOS ABS: 0.1 10*3/uL (ref 0.0–0.7)
EOS PCT: 2 %
HCT: 34.2 % — ABNORMAL LOW (ref 39.0–52.0)
HEMOGLOBIN: 10.2 g/dL — AB (ref 13.0–17.0)
Lymphocytes Relative: 12 %
Lymphs Abs: 0.9 10*3/uL (ref 0.7–4.0)
MCH: 24.9 pg — AB (ref 26.0–34.0)
MCHC: 29.8 g/dL — AB (ref 30.0–36.0)
MCV: 83.4 fL (ref 78.0–100.0)
MONO ABS: 0.7 10*3/uL (ref 0.1–1.0)
Monocytes Relative: 10 %
NEUTROS ABS: 5.5 10*3/uL (ref 1.7–7.7)
Neutrophils Relative %: 75 %
PLATELETS: 276 10*3/uL (ref 150–400)
RBC: 4.1 MIL/uL — ABNORMAL LOW (ref 4.22–5.81)
RDW: 24 % — ABNORMAL HIGH (ref 11.5–15.5)
WBC: 7.3 10*3/uL (ref 4.0–10.5)

## 2015-08-04 MED ORDER — CLARITHROMYCIN 500 MG PO TABS: 500.0000 mg | ORAL_TABLET | Freq: Two times a day (BID) | ORAL | Status: DC

## 2015-08-04 NOTE — Patient Instructions (Signed)
Your physician recommends that you schedule a follow-up appointment in: 1 Week with Dr. Purvis Sheffield  Your physician has recommended you make the following change in your medication:  Start Clarithromycin 500 mg Take One Tablet Two Times Daily  Your physician recommends that you have lab work today. (CBC)  Your physician has requested that you have a lower or upper extremity venous duplex. This test is an ultrasound of the veins in the legs or arms. It looks at venous blood flow that carries blood from the heart to the legs or arms. Allow one hour for a Lower Venous exam. Allow thirty minutes for an Upper Venous exam. There are no restrictions or special instructions.  If you need a refill on your cardiac medications before your next appointment, please call your pharmacy.  Thank you for choosing [facility]!    Marland Kitchen

## 2015-08-04 NOTE — Progress Notes (Deleted)
Name: Leon Freeman    DOB: 11-May-1931  Age: 82 y.o.  MR#: [id number]       PCP:  ADDIS,DANIEL, DO      Insurance: Payor: MEDICARE / Plan: MEDICARE PART A AND B / Product Type: *No Product type* /   CC:   No chief complaint on file.   VS Filed Vitals:   08/04/15 1437  BP: 104/58  Pulse: 90  Height:  (1.702 m)  Weight: 174 lb (78.926 kg)  SpO2: 95%    Weights Current Weight  08/04/15 174 lb (78.926 kg)  07/28/15 172 lb (78.019 kg)  07/14/15 171 lb (77.565 kg)    Blood Pressure  BP Readings from Last 3 Encounters:  08/04/15 104/58  07/28/15 124/68  07/14/15 110/76     Admit date:  (Not on file) Last encounter with RMR:  07/29/2015   Allergy Penicillins and Plaquenil  Current Outpatient Prescriptions  Medication Sig Dispense Refill  . apixaban (ELIQUIS) 5 MG TABS tablet Take 1 tablet (5 mg total) by mouth 2 (two) times daily. 60 tablet 3  . aspirin EC 81 MG tablet Take 1 tablet (81 mg total) by mouth daily.    Marland Kitchen BIOTIN PO Take 1 tablet by mouth daily.    . calcium gluconate 500 MG tablet Take 1 tablet by mouth daily.    . clindamycin (CLEOCIN) 300 MG capsule Take 300 mg by mouth See admin instructions. Patient will takes 2 by mouth prior to dental treatment.    Marland Kitchen ezetimibe (ZETIA) 10 MG tablet Take 10 mg by mouth at bedtime.  11  . ferrous sulfate 325 (65 FE) MG tablet Take 1 tablet (325 mg total) by mouth daily with breakfast. 30 tablet 0  . furosemide (LASIX) 20 MG tablet Take 1.5 tablets (30 mg total) by mouth daily. 45 tablet 3  . Glucosamine-Chondroit-Vit C-Mn (GLUCOSAMINE 1500 COMPLEX) CAPS Take 1 capsule by mouth daily.     . Lactobacillus (BIOTINEX PO) Take 10,000 mcg by mouth daily.    . metoprolol succinate (TOPROL-XL) 25 MG 24 hr tablet Take 1 1/2 tablets daily 45 tablet 3  . METRONIDAZOLE, TOPICAL, (METROLOTION) 0.75 % LOTN Apply topically daily.    Marland Kitchen omeprazole (PRILOSEC) 40 MG capsule Take 40 mg by mouth daily.    . polyethylene glycol (MIRALAX / GLYCOLAX)  packet Take 17 g by mouth daily as needed for mild constipation. 14 each 0  . predniSONE (DELTASONE) 5 MG tablet Take 5 mg by mouth daily with breakfast.    . simvastatin (ZOCOR) 40 MG tablet Take 40 mg by mouth daily.    . vitamin B-12 (CYANOCOBALAMIN) 1000 MCG tablet Take 1,000 mcg by mouth daily.    . vitamin C (ASCORBIC ACID) 500 MG tablet Take 500 mg by mouth daily.    . Vitamins-Lipotropics (LIPO-FLAVONOID PLUS) TABS Take 2 tablets by mouth daily.    . Zinc 25 MG TABS Take 1 tablet by mouth daily.      No current facility-administered medications for this visit.    Discontinued Meds:    Medications Discontinued During This Encounter  Medication Reason  . ferrous sulfate 325 (65 FE) MG tablet Error  . Cholecalciferol (VITAMIN D) 2000 units CAPS Error    Patient Active Problem List   Diagnosis Date Noted  . Acute CHF (congestive heart failure) (HCC) 07/03/2015  . Demand ischemia (HCC)   . Acute blood loss anemia   . Syncope 06/30/2015  . Anemia 06/30/2015  . History of CEA (  carotid endarterectomy) right  06/30/2015  . Elevated troponin   . Fall   . Excessive sleepiness   . Rheumatoid arthritis (HCC) 06/27/2015  . 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 Hx of CABG x 4 2009 06/26/2015  . S/P AVR (aortic valve replacement) porcine 2009 06/26/2015  . Hypertension 06/26/2015  . GERD (gastroesophageal reflux disease) 06/26/2015  . History of gastric ulcer 06/26/2015  . Anemia, iron deficiency 06/26/2015  . Heme positive stool 06/26/2015    LABS    Component Value Date/Time   NA 138 07/22/2015 1444   NA 135 07/14/2015 1620   NA 137 07/06/2015 0645   K 4.3 07/22/2015 1444   K 4.5 07/14/2015 1620   K 4.2 07/06/2015 0645   CL 104 07/22/2015 1444   CL 100* 07/14/2015 1620   CL 104 07/06/2015 0645   CO2 26 07/22/2015 1444   CO2 27 07/14/2015 1620   CO2 25 07/06/2015 0645   GLUCOSE 142* 07/22/2015 1444   GLUCOSE 117* 07/14/2015 1620   GLUCOSE 117*  07/06/2015 0645   BUN 19 07/22/2015 1444   BUN 21* 07/14/2015 1620   BUN 23* 07/06/2015 0645   CREATININE 1.19 07/22/2015 1444   CREATININE 1.29* 07/14/2015 1620   CREATININE 1.15 07/06/2015 0645   CALCIUM 8.5* 07/22/2015 1444   CALCIUM 8.7* 07/14/2015 1620   CALCIUM 8.7* 07/06/2015 0645   GFRNONAA 55* 07/22/2015 1444   GFRNONAA 50* 07/14/2015 1620   GFRNONAA 57* 07/06/2015 0645   GFRAA >60 07/22/2015 1444   GFRAA 58* 07/14/2015 1620   GFRAA >60 07/06/2015 0645   CMP     Component Value Date/Time   NA 138 07/22/2015 1444   K 4.3 07/22/2015 1444   CL 104 07/22/2015 1444   CO2 26 07/22/2015 1444   GLUCOSE 142* 07/22/2015 1444   BUN 19 07/22/2015 1444   CREATININE 1.19 07/22/2015 1444   CALCIUM 8.5* 07/22/2015 1444   GFRNONAA 55* 07/22/2015 1444   GFRAA >60 07/22/2015 1444       Component Value Date/Time   WBC 7.1 07/14/2015 1620   WBC 8.8 07/06/2015 0645   WBC 9.1 07/05/2015 0658   HGB 10.3* 07/14/2015 1620   HGB 10.1* 07/06/2015 0645   HGB 10.6* 07/05/2015 0658   HCT 34.4* 07/14/2015 1620   HCT 33.4* 07/06/2015 0645   HCT 35.5* 07/05/2015 0658   MCV 78.4 07/14/2015 1620   MCV 76.3* 07/06/2015 0645   MCV 77.0* 07/05/2015 0658    Lipid Panel  No results found for: CHOL, TRIG, HDL, CHOLHDL, VLDL, LDLCALC, LDLDIRECT  ABG No results found for: PHART, PCO2ART, PO2ART, HCO3, TCO2, ACIDBASEDEF, O2SAT   Lab Results  Component Value Date   TSH 1.199 07/01/2015   BNP (last 3 results) No results for input(s): BNP in the last 8760 hours.  ProBNP (last 3 results) No results for input(s): PROBNP in the last 8760 hours.  Cardiac Panel (last 3 results) No results for input(s): CKTOTAL, CKMB, TROPONINI, RELINDX in the last 72 hours.  Iron/TIBC/Ferritin/ %Sat    Component Value Date/Time   IRON 10* 06/27/2015 1425   TIBC 445 06/27/2015 1425   FERRITIN 10* 06/27/2015 1425   IRONPCTSAT 2* 06/27/2015 1425     EKG Orders placed or performed in visit on 07/14/15  .  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 EXAM: PORTABLE CHEST 1 VIEW COMPARISON:  07/04/2015 FINDINGS: Cardiomegaly CABG changes again noted. Decreased bilateral lower lung airspace disease noted. There is no evidence of pneumothorax. Small effusions are not excluded. No acute bony abnormalities are identified. IMPRESSION: Decreased bilateral lower lung airspace disease/edema. Electronically Signed   By: Harmon Pier M.D.   On: 07/06/2015 09:32

## 2015-08-04 NOTE — Progress Notes (Signed)
Cardiology Office Note   Date:  08/04/2015   ID:  Leon Freeman, DOB [birth year], MRN [id number]  PCP:  ADDIS,DANIEL, DO  Cardiologist: Inis Sizer, NP   Chief Complaint  Patient presents with  . Leg Swelling      History of Present Illness: Leon Freeman is a 82 y.o. male who presents for ongoing assessment and management of coronary artery disease, status post CABG x4, porcine aortic valve replacement at [facility] in 2009. The patient was last seen in the office on 07/28/2015. He was to be considered for a TAVR but had to remain on anticoagulation for a minimum of 6 months due to recent history of DVT.  He comes today after being seen in less than a week ago with complaints of unilateral lower extremity edema on the right with pain and redness. The patient states that he was walking at the gym and fell onto his right knee,he denies twisting his ankle or injuring his leg. He states that the swelling in the right leg below the knee has worsened with significant edema around his ankle. And pretibial area. There is some redness as well. He denies any weightbearing pain and it does not hurt with range of motion of the ankle.    Past Medical History  Diagnosis Date  . Anemia   . Coronary artery disease     Past Surgical History  Procedure Laterality Date  . Cardiac valve surgery  2009  . Trigger finger release Left   . Leg surgery    . Colonoscopy  2015    Dr.Spainhour  . Upper gastrointestinal endoscopy  2016    Dr.Spainhour  . Appendectomy    . Eye surgery Left   . Coronary artery bypass graft    . Esophagogastroduodenoscopy N/A 06/27/2015    Procedure: ESOPHAGOGASTRODUODENOSCOPY (EGD);  Surgeon: Malissa Hippo, MD;  [facility]: [facility];  Service: Endoscopy;  Laterality: N/A;  255     Current Outpatient Prescriptions  Medication Sig Dispense Refill  . apixaban (ELIQUIS) 5 MG TABS tablet Take 1 tablet (5 mg total) by mouth 2 (two) times daily. 60 tablet  3  . aspirin EC 81 MG tablet Take 1 tablet (81 mg total) by mouth daily.    Marland Kitchen BIOTIN PO Take 1 tablet by mouth daily.    . calcium gluconate 500 MG tablet Take 1 tablet by mouth daily.    . clindamycin (CLEOCIN) 300 MG capsule Take 300 mg by mouth See admin instructions. Patient will takes 2 by mouth prior to dental treatment.    Marland Kitchen ezetimibe (ZETIA) 10 MG tablet Take 10 mg by mouth at bedtime.  11  . ferrous sulfate 325 (65 FE) MG tablet Take 1 tablet (325 mg total) by mouth daily with breakfast. 30 tablet 0  . furosemide (LASIX) 20 MG tablet Take 1.5 tablets (30 mg total) by mouth daily. 45 tablet 3  . Glucosamine-Chondroit-Vit C-Mn (GLUCOSAMINE 1500 COMPLEX) CAPS Take 1 capsule by mouth daily.     . Lactobacillus (BIOTINEX PO) Take 10,000 mcg by mouth daily.    . metoprolol succinate (TOPROL-XL) 25 MG 24 hr tablet Take 1 1/2 tablets daily 45 tablet 3  . METRONIDAZOLE, TOPICAL, (METROLOTION) 0.75 % LOTN Apply topically daily.    Marland Kitchen omeprazole (PRILOSEC) 40 MG capsule Take 40 mg by mouth daily.    . polyethylene glycol (MIRALAX / GLYCOLAX) packet Take 17 g by mouth daily as needed for mild constipation. 14 each 0  . predniSONE (  DELTASONE) 5 MG tablet Take 5 mg by mouth daily with breakfast.    . simvastatin (ZOCOR) 40 MG tablet Take 40 mg by mouth daily.    . vitamin B-12 (CYANOCOBALAMIN) 1000 MCG tablet Take 1,000 mcg by mouth daily.    . vitamin C (ASCORBIC ACID) 500 MG tablet Take 500 mg by mouth daily.    . Vitamins-Lipotropics (LIPO-FLAVONOID PLUS) TABS Take 2 tablets by mouth daily.    . Zinc 25 MG TABS Take 1 tablet by mouth daily.     . clarithromycin (BIAXIN) 500 MG tablet Take 1 tablet (500 mg total) by mouth 2 (two) times daily. 20 tablet 0   No current facility-administered medications for this visit.    Allergies:   Penicillins and Plaquenil    Social History:  The patient  reports that he quit smoking about 40 years ago. He has never used smokeless tobacco. He reports that he  does not drink alcohol.   Family History:  The patient's family history includes Bipolar disorder in his son; Dementia in his mother; Healthy in his daughter; Heart disease in his father.    ROS: All other systems are reviewed and negative. Unless otherwise mentioned in H&P    PHYSICAL EXAM: VS:  BP 104/58 mmHg  Pulse 90  Ht 5\' 7"  (1.702 m)  Wt 174 lb (78.926 kg)  BMI 27.25 kg/m2  SpO2 95% , BMI Body mass index is 27.25 kg/(m^2). GEN: Well nourished, well developed, in no acute distress HEENT: normal Neck: no JVD, carotid bruits, or masses Cardiac: RRR; no murmurs, rubs, or gallops,no edema  Respiratory:  clear to auscultation bilaterally, normal work of breathing GI: soft, nontender, nondistended, + BS MS: no deformity or atrophyunilateral lower extremity edema below the knee of the right leg with significant varicosities and edema in the ankle and foot.  Psych: euthymic mood, full affect   Recent Labs: 07/01/2015: TSH 1.199 07/05/2015: Magnesium 1.9 07/14/2015: Hemoglobin 10.3*; Platelets 425* 07/22/2015: BUN 19; Creatinine, Ser 1.19; Potassium 4.3; Sodium 138    Lipid Panel No results found for: CHOL, TRIG, HDL, CHOLHDL, VLDL, LDLCALC, LDLDIRECT    Wt Readings from Last 3 Encounters:  08/04/15 174 lb (78.926 kg)  07/28/15 172 lb (78.019 kg)  07/14/15 171 lb (77.565 kg)      ASSESSMENT AND PLAN:  1. Unilateral lower right leg pain and edema: it is unlikely that he has a new DVT and he has been on ELIQUIS 5 mg twice a day. However he has fallen and injured his knee, lower extremity edema which is unilateral, below the knee. Although this is very unlikely I will do a Doppler venous ultrasound to check for recurrent DVT. He also will be placed on clindamycin 500 mg by mouth twice a day as he has some redness and edema which may be early cellulitis. He does have a scratch on the posterior segment of the right malleolus and a cut on his knee.there is warmth and erythema noted  pretibial.  It is possible that he may be getting a bacterial infection. He will followup with his primary care physician for ongoing evaluation and management.  2. Severe AoV stenosis: the patient is to be planned for a TAVR in 3 months after being on ELIQUIS do to a DVT of the right peroneal vein. This was found in March 2017.he will speak with Dr. 12-05-1997 on next office visit to discuss plans to proceed with this if there is no further evidence of cellulitis or right  lower extremity edema.  3. Chronic systolic CHF.patient will continue his current medication regimen. This does include Lasix 1.5 mg daily. Will not make any changes in his medication regimen until diagnostic testing is completed and he follows up with Dr. Purvis Sheffield.  Current medicines are reviewed at length with the patient today.    Labs/ tests ordered today include: venous Doppler ultrasound of the right lower extremity with known history of DVT  Orders Placed This Encounter  Procedures  . US Venous Img Lower Unilateral Right  . CBC with Differential     Disposition:   FU with Dr. Purvis Sheffield  Signed, Joni Reining, NP  08/04/2015 4:23 PM    [facility] [address] [place], [place] [address] Phone: [phone]; Fax: [phone]

## 2015-08-05 ENCOUNTER — Telehealth: Payer: Self-pay | Admitting: *Deleted

## 2015-08-05 ENCOUNTER — Ambulatory Visit (HOSPITAL_COMMUNITY): Payer: Medicare Other

## 2015-08-05 NOTE — Telephone Encounter (Signed)
Spoke with Leon Freeman at [email or web address] in [place]. Patient instructed to take 1/2 Tablet (20 mg) of Simvastatin while on Clindamycin and normal dose after completing ABX.

## 2015-08-05 NOTE — Telephone Encounter (Signed)
-----   Message from Jodelle Gross, NP sent at 08/05/2015  9:04 AM EDT ----- Reviewed. No elevation in WBC's or evidence of anemia.

## 2015-08-05 NOTE — Telephone Encounter (Signed)
Called patient with test results. No answer. Left message to call back.  

## 2015-08-06 ENCOUNTER — Ambulatory Visit (HOSPITAL_COMMUNITY)
Admission: RE | Admit: 2015-08-06 | Discharge: 2015-08-06 | Disposition: A | Discharge status: Home / Self Care | Payer: Medicare Other | Source: Ambulatory Visit | Attending: Adult Health | Admitting: Adult Health

## 2015-08-06 DIAGNOSIS — I82491 Acute embolism and thrombosis of other specified deep vein of right lower extremity: Secondary | ICD-10-CM | POA: Insufficient documentation

## 2015-08-06 DIAGNOSIS — M79606 Pain in leg, unspecified: Secondary | ICD-10-CM | POA: Insufficient documentation

## 2015-08-07 ENCOUNTER — Telehealth (INDEPENDENT_AMBULATORY_CARE_PROVIDER_SITE_OTHER): Payer: Self-pay | Admitting: *Deleted

## 2015-08-07 ENCOUNTER — Telehealth: Payer: Self-pay | Admitting: Adult Health

## 2015-08-07 ENCOUNTER — Ambulatory Visit: Payer: Medicare Other | Admitting: Cardiovascular Disease

## 2015-08-07 DIAGNOSIS — D509 Iron deficiency anemia, unspecified: Secondary | ICD-10-CM

## 2015-08-07 NOTE — Telephone Encounter (Signed)
Per Dr.Rehman the patient will need to have labs drawn. H&H and obtain a hemoccult x1.

## 2015-08-07 NOTE — Telephone Encounter (Signed)
Pt is wondering if the results from his test have came back yet. He can be reached on tele# [phone]

## 2015-08-13 ENCOUNTER — Encounter: Payer: Self-pay | Admitting: Cardiovascular Disease

## 2015-08-13 ENCOUNTER — Telehealth (INDEPENDENT_AMBULATORY_CARE_PROVIDER_SITE_OTHER): Payer: Self-pay | Admitting: *Deleted

## 2015-08-13 ENCOUNTER — Ambulatory Visit (INDEPENDENT_AMBULATORY_CARE_PROVIDER_SITE_OTHER): Payer: Medicare Other | Admitting: Cardiovascular Disease

## 2015-08-13 VITALS — BP 98/80 | HR 87 | Ht 67.0 in | Wt 176.0 lb

## 2015-08-13 DIAGNOSIS — Z79899 Other long term (current) drug therapy: Secondary | ICD-10-CM | POA: Diagnosis not present

## 2015-08-13 DIAGNOSIS — I350 Nonrheumatic aortic (valve) stenosis: Secondary | ICD-10-CM

## 2015-08-13 DIAGNOSIS — R55 Syncope and collapse: Secondary | ICD-10-CM

## 2015-08-13 DIAGNOSIS — I25768 Atherosclerosis of bypass graft of coronary artery of transplanted heart with other forms of angina pectoris: Secondary | ICD-10-CM

## 2015-08-13 DIAGNOSIS — I5022 Chronic systolic (congestive) heart failure: Secondary | ICD-10-CM

## 2015-08-13 DIAGNOSIS — I82401 Acute embolism and thrombosis of unspecified deep veins of right lower extremity: Secondary | ICD-10-CM | POA: Diagnosis not present

## 2015-08-13 DIAGNOSIS — I6523 Occlusion and stenosis of bilateral carotid arteries: Secondary | ICD-10-CM | POA: Diagnosis not present

## 2015-08-13 MED ORDER — NITROGLYCERIN 0.4 MG SL SUBL: 0.4000 mg | SUBLINGUAL_TABLET | SUBLINGUAL | Status: DC | PRN

## 2015-08-13 NOTE — Progress Notes (Signed)
Patient ID: Leon Freeman, male   DOB: 13-Apr-1931, 82 y.o.   MRN: [id number]      SUBJECTIVE: The patient returns after being hospitalized in March at [facility]. He has a history of 4 vessel CABG and porcine AVR in 2009 at [facility]. He also has a history of right carotid endarterectomy. He had some degree of CHF. He also has a right peroneal vein DVT and is on apixaban.  Echocardiogram 07/01/15 showed mild to moderately reduced left ventricular systolic function, EF 40-45%, grade 1 diastolic dysfunction with high filling pressures, bioprosthetic aortic valve stenosis, peak velocity 3.59 m/s, mean gradient 34 mmHg, valve area 0.77 cm, mild mitral and mild to moderate tricuspid regurgitation, severe left atrial dilatation, mild right atrial dilatation, and mildly reduced right ventricular systolic function.  He saw K. Lawrence NP on 5/1 and was given clindamycin for cellulitis.  Seldom has chest pains. Right leg swelling has improved. Wearing compression stockings. Denies orthopnea. Wants to exercise more and begin driving.   Review of Systems: As per "subjective", otherwise negative.  Allergies  Allergen Reactions  . Penicillins Itching    Has patient had a PCN reaction causing immediate rash, facial/tongue/throat swelling, SOB or lightheadedness with hypotension: Yes Has patient had a PCN reaction causing severe rash involving mucus membranes or skin necrosis: No Has patient had a PCN reaction that required hospitalization No Has patient had a PCN reaction occurring within the last 10 years: No If all of the above answers are "NO", then may proceed with Cephalosporin use.    . Plaquenil [Hydroxychloroquine Sulfate] Itching and Other (See Comments)    redness    Current Outpatient Prescriptions  Medication Sig Dispense Refill  . apixaban (ELIQUIS) 5 MG TABS tablet Take 1 tablet (5 mg total) by mouth 2 (two) times daily. 60 tablet 3  . aspirin EC 81 MG tablet Take 1 tablet (81 mg  total) by mouth daily.    Marland Kitchen BIOTIN PO Take 1 tablet by mouth daily.    . calcium gluconate 500 MG tablet Take 1 tablet by mouth daily.    . clarithromycin (BIAXIN) 500 MG tablet Take 1 tablet (500 mg total) by mouth 2 (two) times daily. 20 tablet 0  . clindamycin (CLEOCIN) 300 MG capsule Take 300 mg by mouth See admin instructions. Patient will takes 2 by mouth prior to dental treatment.    Marland Kitchen ezetimibe (ZETIA) 10 MG tablet Take 10 mg by mouth at bedtime.  11  . ferrous sulfate 325 (65 FE) MG tablet Take 1 tablet (325 mg total) by mouth daily with breakfast. 30 tablet 0  . furosemide (LASIX) 20 MG tablet Take 1.5 tablets (30 mg total) by mouth daily. 45 tablet 3  . Glucosamine-Chondroit-Vit C-Mn (GLUCOSAMINE 1500 COMPLEX) CAPS Take 1 capsule by mouth daily.     . Lactobacillus (BIOTINEX PO) Take 10,000 mcg by mouth daily.    . metoprolol succinate (TOPROL-XL) 25 MG 24 hr tablet Take 1 1/2 tablets daily 45 tablet 3  . METRONIDAZOLE, TOPICAL, (METROLOTION) 0.75 % LOTN Apply topically daily.    Marland Kitchen omeprazole (PRILOSEC) 40 MG capsule Take 40 mg by mouth daily.    . polyethylene glycol (MIRALAX / GLYCOLAX) packet Take 17 g by mouth daily as needed for mild constipation. 14 each 0  . predniSONE (DELTASONE) 5 MG tablet Take 5 mg by mouth daily with breakfast.    . simvastatin (ZOCOR) 40 MG tablet Take 40 mg by mouth daily.    . vitamin  B-12 (CYANOCOBALAMIN) 1000 MCG tablet Take 1,000 mcg by mouth daily.    . vitamin C (ASCORBIC ACID) 500 MG tablet Take 500 mg by mouth daily.    . Vitamins-Lipotropics (LIPO-FLAVONOID PLUS) TABS Take 2 tablets by mouth daily.    . Zinc 25 MG TABS Take 1 tablet by mouth daily.      No current facility-administered medications for this visit.    Past Medical History  Diagnosis Date  . Anemia   . Coronary artery disease     Past Surgical History  Procedure Laterality Date  . Cardiac valve surgery  2009  . Trigger finger release Left   . Leg surgery    .  Colonoscopy  2015    Dr.Spainhour  . Upper gastrointestinal endoscopy  2016    Dr.Spainhour  . Appendectomy    . Eye surgery Left   . Coronary artery bypass graft    . Esophagogastroduodenoscopy N/A 06/27/2015    Procedure: ESOPHAGOGASTRODUODENOSCOPY (EGD);  Surgeon: Malissa Hippo, MD;  Location: [facility];  Service: Endoscopy;  Laterality: N/A;  255    Social History   Social History  . Marital Status: Married    Spouse Name: N/A  . Number of Children: N/A  . Years of Education: N/A   Occupational History  . Not on file.   Social History Main Topics  . Smoking status: Former Smoker    Quit date: 06/26/1975  . Smokeless tobacco: Never Used  . Alcohol Use: No     Comment: Patient quit 40 years ago.  . Drug Use: Not on file  . Sexual Activity: Not on file   Other Topics Concern  . Not on file   Social History Narrative     Filed Vitals:   08/13/15 1458  BP: 98/80  Pulse: 87  Height: 5\' 7"  (1.702 m)  Weight: 176 lb (79.833 kg)  SpO2: 92%    PHYSICAL EXAM General appearance: alert, cooperative and no distress Neck: no JVD Lungs: Clear, no rales. Diminished at bases. Heart: Regular rate and rhythm and 2/6 systolic murmur at RUSB Extremities: 1+ pitting right leg and trace left leg edema. Neurologic: Grossly normal Skin: Normal. Musculoskeletal: Normal range of motion.   ECG: Most recent ECG reviewed.      ASSESSMENT AND PLAN: 1. CAD s/p CABG: Stable. Continue aspirin, statin, metoprolol, and Zetia.  2. Bioprosthetic aortic valve stenosis: I don't necessarily think he needs intervention at this point, but will refer to TAVR team for consultation.  3. Right leg DVT: Continue Eliquis.  4. Chronic systolic heart failure: Euvolemic on Lasix 30 mg daily. No changes.  5. Carotid artery stenosis s/p right CEA: Continue aspirin and statin therapy. Dopplers 06/30/15 <50% RICA and 50-69% LICA stenosis. Repeat in one year.  Dispo: fu 4 months.  07/02/15, M.D., F.A.C.C.

## 2015-08-13 NOTE — Telephone Encounter (Signed)
   Diagnosis:    Result(s)   Card 1: Positive          Completed by: Larose Hires , LPN   HEMOCCULT SENSA DEVELOPER: LOT#:  [id number] EXPIRATION DATE: 12/2015   HEMOCCULT SENSA CARD:  LOT#:  [id number] EXPIRATION DATE: 07/18   CARD CONTROL RESULTS:  POSITIVE: Positive NEGATIVE: Negative    ADDITIONAL COMMENTS: Patient's daughter was called and made aware.

## 2015-08-13 NOTE — Patient Instructions (Signed)
Your physician recommends that you continue on your current medications as directed. Please refer to the Current Medication list given to you today. Your physician sent a prescription for sublingual nitroglycerin 0.4 mg for severe chest pain every 5 minutes up to 3 doses. If no relief after 3rd dose, proceed to the ED for an evaluation. You have been referred to the cardiology team for a TAVR. Your physician recommends that you schedule a follow-up appointment in: 4 months.

## 2015-08-14 ENCOUNTER — Telehealth: Payer: Self-pay | Admitting: *Deleted

## 2015-08-14 ENCOUNTER — Telehealth (INDEPENDENT_AMBULATORY_CARE_PROVIDER_SITE_OTHER): Payer: Self-pay | Admitting: *Deleted

## 2015-08-14 ENCOUNTER — Encounter (INDEPENDENT_AMBULATORY_CARE_PROVIDER_SITE_OTHER): Payer: Self-pay | Admitting: *Deleted

## 2015-08-14 DIAGNOSIS — D509 Iron deficiency anemia, unspecified: Secondary | ICD-10-CM

## 2015-08-14 LAB — HEMOGLOBIN AND HEMATOCRIT, BLOOD
HEMATOCRIT: 33.4 % — AB (ref 38.5–50.0)
HEMOGLOBIN: 9.9 g/dL — AB (ref 13.2–17.1)

## 2015-08-14 NOTE — Telephone Encounter (Signed)
Pt daughter wanting to let Dr. Purvis Sheffield know lab results hemoglobin 9.9 - hemoccult came back positive - pt on Eliquis Daughter days Dr. Karilyn Cota will repeat labs in 2 weeks. Will forward to Dr Purvis Sheffield

## 2015-08-14 NOTE — Telephone Encounter (Signed)
Unfortunately, given recent DVT, cannot stop Eliquis as it could lead to pulmonary embolism which is potentially fatal.

## 2015-08-14 NOTE — Telephone Encounter (Signed)
Per Dr.Rehman the patient will need to have labs drawn in 2 weeks. This will be May 25 th 2017. The patient will be getting it drawn in [place] at [facility].

## 2015-08-14 NOTE — Telephone Encounter (Signed)
Pt daughter aware

## 2015-08-15 NOTE — Telephone Encounter (Signed)
Hemoccult positive. Will check H&H every 4 weeks.

## 2015-08-15 NOTE — Telephone Encounter (Signed)
Talked with the patient;s daughter. She states that she would prefer that we check the H&H in 2 weeks, as she is concerned that he may get like he was in May. A lab order has been sent to [facility] in [place] , [place], and is due to be done 08/28/2015.

## 2015-08-18 ENCOUNTER — Encounter (HOSPITAL_COMMUNITY): Payer: Self-pay | Admitting: *Deleted

## 2015-08-18 ENCOUNTER — Emergency Department (HOSPITAL_COMMUNITY)
Admission: EM | Admit: 2015-08-18 | Discharge: 2015-08-18 | Disposition: A | Discharge status: Home / Self Care | Payer: Medicare Other | Attending: Emergency Medicine | Admitting: Emergency Medicine

## 2015-08-18 DIAGNOSIS — Z87891 Personal history of nicotine dependence: Secondary | ICD-10-CM | POA: Insufficient documentation

## 2015-08-18 DIAGNOSIS — I2510 Atherosclerotic heart disease of native coronary artery without angina pectoris: Secondary | ICD-10-CM | POA: Insufficient documentation

## 2015-08-18 DIAGNOSIS — Z7982 Long term (current) use of aspirin: Secondary | ICD-10-CM | POA: Insufficient documentation

## 2015-08-18 DIAGNOSIS — K922 Gastrointestinal hemorrhage, unspecified: Secondary | ICD-10-CM | POA: Diagnosis not present

## 2015-08-18 DIAGNOSIS — K625 Hemorrhage of anus and rectum: Secondary | ICD-10-CM | POA: Diagnosis present

## 2015-08-18 DIAGNOSIS — Z79899 Other long term (current) drug therapy: Secondary | ICD-10-CM | POA: Diagnosis not present

## 2015-08-18 LAB — BASIC METABOLIC PANEL
Anion gap: 6 (ref 5–15)
BUN: 25 mg/dL — AB (ref 6–20)
CALCIUM: 8.9 mg/dL (ref 8.9–10.3)
CO2: 27 mmol/L (ref 22–32)
CREATININE: 1.19 mg/dL (ref 0.61–1.24)
Chloride: 101 mmol/L (ref 101–111)
GFR calc non Af Amer: 55 mL/min — ABNORMAL LOW (ref >60)
Glucose, Bld: 120 mg/dL — ABNORMAL HIGH (ref 65–99)
Potassium: 4 mmol/L (ref 3.5–5.1)
Sodium: 134 mmol/L — ABNORMAL LOW (ref 135–145)

## 2015-08-18 LAB — CBC WITH DIFFERENTIAL/PLATELET
BASOS PCT: 1 %
Basophils Absolute: 0 10*3/uL (ref 0.0–0.1)
EOS ABS: 0.1 10*3/uL (ref 0.0–0.7)
EOS PCT: 2 %
HEMATOCRIT: 36.5 % — AB (ref 39.0–52.0)
HEMOGLOBIN: 11 g/dL — AB (ref 13.0–17.0)
Lymphocytes Relative: 10 %
Lymphs Abs: 0.6 10*3/uL — ABNORMAL LOW (ref 0.7–4.0)
MCH: 25.9 pg — AB (ref 26.0–34.0)
MCHC: 30.1 g/dL (ref 30.0–36.0)
MCV: 86.1 fL (ref 78.0–100.0)
MONO ABS: 0.7 10*3/uL (ref 0.1–1.0)
MONOS PCT: 12 %
NEUTROS PCT: 75 %
Neutro Abs: 4.2 10*3/uL (ref 1.7–7.7)
PLATELETS: 252 10*3/uL (ref 150–400)
RBC: 4.24 MIL/uL (ref 4.22–5.81)
RDW: 24.3 % — ABNORMAL HIGH (ref 11.5–15.5)
WBC: 5.6 10*3/uL (ref 4.0–10.5)

## 2015-08-18 LAB — POC OCCULT BLOOD, ED
FECAL OCCULT BLD: NEGATIVE
FECAL OCCULT BLD: POSITIVE — AB

## 2015-08-18 LAB — PROTIME-INR
INR: 1.13 (ref 0.00–1.49)
Prothrombin Time: 14.7 seconds (ref 11.6–15.2)

## 2015-08-18 NOTE — Discharge Instructions (Signed)
Follow-up with your gastroenterologist as previously scheduled.  Return to the ER if you develop black or bloody stools, or dizziness, shortness of breath, or chest pain.   Gastrointestinal Bleeding Gastrointestinal (GI) bleeding means there is bleeding somewhere along the digestive tract, between the mouth and anus. CAUSES  There are many different problems that can cause GI bleeding. Possible causes include:  Esophagitis. This is inflammation, irritation, or swelling of the esophagus.  Hemorrhoids.These are veins that are full of blood (engorged) in the rectum. They cause pain, inflammation, and may bleed.  Anal fissures.These are areas of painful tearing which may bleed. They are often caused by passing hard stool.  Diverticulosis.These are pouches that form on the colon over time, with age, and may bleed significantly.  Diverticulitis.This is inflammation in areas with diverticulosis. It can cause pain, fever, and bloody stools, although bleeding is rare.  Polyps and cancer. Colon cancer often starts out as precancerous polyps.  Gastritis and ulcers.Bleeding from the upper gastrointestinal tract (near the stomach) may travel through the intestines and produce black, sometimes tarry, often bad smelling stools. In certain cases, if the bleeding is fast enough, the stools may not be black, but red. This condition may be life-threatening. SYMPTOMS   Vomiting bright red blood or material that looks like coffee grounds.  Bloody, black, or tarry stools. DIAGNOSIS  Your caregiver may diagnose your condition by taking your history and performing a physical exam. More tests may be needed, including:  X-rays and other imaging tests.  Esophagogastroduodenoscopy (EGD). This test uses a flexible, lighted tube to look at your esophagus, stomach, and small intestine.  Colonoscopy. This test uses a flexible, lighted tube to look at your colon. TREATMENT  Treatment depends on the cause of  your bleeding.   For bleeding from the esophagus, stomach, small intestine, or colon, the caregiver doing your EGD or colonoscopy may be able to stop the bleeding as part of the procedure.  Inflammation or infection of the colon can be treated with medicines.  Many rectal problems can be treated with creams, suppositories, or warm baths.  Surgery is sometimes needed.  Blood transfusions are sometimes needed if you have lost a lot of blood. If bleeding is slow, you may be allowed to go home. If there is a lot of bleeding, you will need to stay in the hospital for observation. HOME CARE INSTRUCTIONS   Take any medicines exactly as prescribed.  Keep your stools soft by eating foods that are high in fiber. These foods include whole grains, legumes, fruits, and vegetables. Prunes (1 to 3 a day) work well for many people.  Drink enough fluids to keep your urine clear or pale yellow. SEEK IMMEDIATE MEDICAL CARE IF:   Your bleeding increases.  You feel lightheaded, weak, or you faint.  You have severe cramps in your back or abdomen.  You pass large blood clots in your stool.  Your problems are getting worse. MAKE SURE YOU:   Understand these instructions.  Will watch your condition.  Will get help right away if you are not doing well or get worse.   This information is not intended to replace advice given to you by your health care provider. Make sure you discuss any questions you have with your health care provider.   Document Released: 03/19/2000 Document Revised: 03/08/2012 Document Reviewed: 09/09/2014 Elsevier Interactive Patient Education [employer]

## 2015-08-18 NOTE — ED Notes (Signed)
Hemocult performed by Dr. Judd Lien, results trace positive.  Mistakenly was entered as negative and is in process of being corrected.  Correct result is positive.

## 2015-08-18 NOTE — ED Provider Notes (Signed)
CSN: [id number]     Arrival date & time 08/18/15  1256 History   First MD Initiated Contact with Patient 08/18/15 1334     Chief Complaint  Patient presents with  . Rectal Bleeding     (Consider location/radiation/quality/duration/timing/severity/associated sxs/prior Treatment) HPI Comments: Patient is an 82 year old male with history of coronary artery disease status post CABG, aortic valve replacement, hypertension, and gastric ulcer. He presents for evaluation of possible GI bleed. He reports to me he has been having "dark brown stool" but has not seen any bright red blood. He denies any fevers, chills, or abdominal pain. He tells me that he came today because his daughter was concerned and wanted him to be evaluated. He denies any shortness of breath, chest pain, or lightheadedness.  Patient is a 82 y.o. male presenting with hematochezia. The history is provided by the patient.  Rectal Bleeding Quality: dark brown. Amount:  Moderate Duration:  1 month Timing:  Intermittent Progression:  Worsening Chronicity:  New Similar prior episodes: no   Relieved by:  Nothing Worsened by:  Nothing tried Ineffective treatments:  None tried Associated symptoms: no abdominal pain and no fever   Risk factors: anticoagulant use     Past Medical History  Diagnosis Date  . Anemia   . Coronary artery disease    Past Surgical History  Procedure Laterality Date  . Cardiac valve surgery  2009  . Trigger finger release Left   . Leg surgery    . Colonoscopy  2015    Dr.Spainhour  . Upper gastrointestinal endoscopy  2016    Dr.Spainhour  . Appendectomy    . Eye surgery Left   . Coronary artery bypass graft    . Esophagogastroduodenoscopy N/A 06/27/2015    Procedure: ESOPHAGOGASTRODUODENOSCOPY (EGD);  Surgeon: Malissa Hippo, MD;  [facility];  Service: Endoscopy;  Laterality: N/A;  255   Family History  Problem Relation Age of Onset  . Dementia Mother   . Heart disease Father    . Healthy Daughter   . Bipolar disorder Son    Social History  Substance Use Topics  . Smoking status: Former Smoker    Quit date: 06/26/1975  . Smokeless tobacco: Never Used  . Alcohol Use: No     Comment: Patient quit 40 years ago.    Review of Systems  Constitutional: Negative for fever.  Gastrointestinal: Positive for hematochezia. Negative for abdominal pain.  All other systems reviewed and are negative.     Allergies  Penicillins and Plaquenil  Home Medications   Prior to Admission medications   Medication Sig Start Date End Date Taking? Authorizing Provider  apixaban (ELIQUIS) 5 MG TABS tablet Take 1 tablet (5 mg total) by mouth 2 (two) times daily. 07/14/15  Yes Jodelle Gross, NP  aspirin EC 81 MG tablet Take 1 tablet (81 mg total) by mouth daily. 07/01/15  Yes Malissa Hippo, MD  BIOTIN PO Take 1 tablet by mouth daily.   Yes Historical Provider, MD  calcium gluconate 500 MG tablet Take 1 tablet by mouth daily.   Yes Historical Provider, MD  ezetimibe (ZETIA) 10 MG tablet Take 10 mg by mouth at bedtime. 07/09/15  Yes Historical Provider, MD  ferrous sulfate 325 (65 FE) MG tablet Take 1 tablet (325 mg total) by mouth daily with breakfast. 07/05/15  Yes Jerald Kief, MD  furosemide (LASIX) 20 MG tablet Take 1.5 tablets (30 mg total) by mouth daily. 07/15/15  Yes Bettey Mare  Lawrence, NP  Glucosamine-Chondroit-Vit C-Mn (GLUCOSAMINE 1500 COMPLEX) CAPS Take 1 capsule by mouth daily.    Yes Historical Provider, MD  Lactobacillus (BIOTINEX PO) Take 10,000 mcg by mouth daily.   Yes Historical Provider, MD  metoprolol succinate (TOPROL-XL) 25 MG 24 hr tablet Take 1 1/2 tablets daily 07/14/15  Yes Jodelle Gross, NP  METRONIDAZOLE, TOPICAL, (METROLOTION) 0.75 % LOTN Apply topically daily.   Yes Historical Provider, MD  nitroGLYCERIN (NITROSTAT) 0.4 MG SL tablet Place 1 tablet (0.4 mg total) under the tongue every 5 (five) minutes as needed for chest pain. Up to 3 doses. If no  relief after 3rd dose, proceed to the ED for an evaluation 08/13/15  Yes Laqueta Linden, MD  omeprazole (PRILOSEC) 40 MG capsule Take 40 mg by mouth daily.   Yes Historical Provider, MD  polyethylene glycol (MIRALAX / GLYCOLAX) packet Take 17 g by mouth daily as needed for mild constipation. 07/05/15  Yes Jerald Kief, MD  predniSONE (DELTASONE) 5 MG tablet Take 5 mg by mouth daily with breakfast.   Yes Historical Provider, MD  simvastatin (ZOCOR) 40 MG tablet Take 40 mg by mouth daily.   Yes Historical Provider, MD  vitamin B-12 (CYANOCOBALAMIN) 1000 MCG tablet Take 1,000 mcg by mouth daily.   Yes Historical Provider, MD  vitamin C (ASCORBIC ACID) 500 MG tablet Take 500 mg by mouth daily.   Yes Historical Provider, MD  Vitamins-Lipotropics (LIPO-FLAVONOID PLUS) TABS Take 2 tablets by mouth daily.   Yes Historical Provider, MD  Zinc 25 MG TABS Take 1 tablet by mouth daily.    Yes Historical Provider, MD  clarithromycin (BIAXIN) 500 MG tablet Take 1 tablet (500 mg total) by mouth 2 (two) times daily. Patient not taking: Reported on 08/18/2015 08/04/15   Jodelle Gross, NP   BP 106/78 mmHg  Pulse 80  Temp(Src) 98.2 F (36.8 C) (Oral)  Resp 18  Ht 5\' 7"  (1.702 m)  Wt 176 lb (79.833 kg)  BMI 27.56 kg/m2  SpO2 91% Physical Exam  Constitutional: He is oriented to person, place, and time. He appears well-developed and well-nourished. No distress.  HENT:  Head: Normocephalic and atraumatic.  Mouth/Throat: Oropharynx is clear and moist.  Neck: Normal range of motion. Neck supple.  Cardiovascular: Normal rate and regular rhythm.  Exam reveals no friction rub.   No murmur heard. Pulmonary/Chest: Effort normal and breath sounds normal. No respiratory distress. He has no wheezes. He has no rales.  Abdominal: Soft. Bowel sounds are normal. He exhibits no distension. There is no tenderness.  Genitourinary: Rectum normal.  Rectal exam reveals no masses or hemorrhoids. There is dark brown stool  present that is not melanotic and there is no gross blood. The Hemoccult test is faintly positive.  Musculoskeletal: Normal range of motion. He exhibits no edema.  Neurological: He is alert and oriented to person, place, and time. Coordination normal.  Skin: Skin is warm and dry. He is not diaphoretic.  Nursing note and vitals reviewed.   ED Course  Procedures (including critical care time) Labs Review Labs Reviewed  BASIC METABOLIC PANEL  CBC WITH DIFFERENTIAL/PLATELET  PROTIME-INR  POC OCCULT BLOOD, ED    Imaging Review No results found. I have personally reviewed and evaluated these images and lab results as part of my medical decision-making.    MDM   Final diagnoses:  None   Patient is an 82 year old male with a history of gastric AVM. He presents for evaluation of possible GI  bleed. Patient was recently hospitalized with similar complaints. This afternoon he became sleepy and difficult to arouse. This is what occurred when he had his prior GI bleed. Today's rectal exam reveals brown stool which is very slightly heme positive. His hemoglobin today is 11, up from 9.9 several days ago. He appears very stable and does not appear to be actively bleeding. I feel as though he is appropriate for outpatient follow-up as scheduled. To return as needed.    Geoffery Lyons, MD 08/18/15 [phone]

## 2015-08-18 NOTE — ED Notes (Signed)
Pt resting quietly with family.  Denies any complaints.

## 2015-08-18 NOTE — ED Notes (Signed)
Pt c/o bloody stools for a month, denies any pain, dizziness,

## 2015-08-29 ENCOUNTER — Ambulatory Visit (INDEPENDENT_AMBULATORY_CARE_PROVIDER_SITE_OTHER): Payer: Medicare Other | Admitting: Cardiovascular Disease

## 2015-08-29 ENCOUNTER — Encounter: Payer: Self-pay | Admitting: Cardiovascular Disease

## 2015-08-29 VITALS — BP 130/60 | HR 83 | Ht 67.0 in | Wt 171.0 lb

## 2015-08-29 DIAGNOSIS — I350 Nonrheumatic aortic (valve) stenosis: Secondary | ICD-10-CM

## 2015-08-29 DIAGNOSIS — I6523 Occlusion and stenosis of bilateral carotid arteries: Secondary | ICD-10-CM | POA: Diagnosis not present

## 2015-08-29 NOTE — Patient Instructions (Signed)
Medication Instructions:  Your physician recommends that you continue on your current medications as directed. Please refer to the Current Medication list given to you today.   Labwork: none  Testing/Procedures: Your physician has requested that you have an echocardiogram. Echocardiography is a painless test that uses sound waves to create images of your heart. It provides your doctor with information about the size and shape of your heart and how well your heart's chambers and valves are working. This procedure takes approximately one hour. There are no restrictions for this procedure. To be done in September in [place]    Follow-Up: Your physician recommends that you schedule a follow-up appointment in: September with Dr. Clifton James (week or so after echo)   Any Other Special Instructions Will Be Listed Below (If Applicable).     If you need a refill on your cardiac medications before your next appointment, please call your pharmacy.

## 2015-08-29 NOTE — Progress Notes (Signed)
TAVR Clinic Consult Note  Referring Provider: Dr. Darl Householder  Chief Complaint  Patient presents with  . New Evaluation    TAVR Consult      History of Present Illness: 82 yo male with history of CAD s/p CABG in 2009, aortic valve disease s/p AVR in 2009 with porcine bioprosthetic aortic valve, HLD, DVT on Eliquis, carotid artery disease who is referred to the valve clinic today by Dr. Darl Householder for discussion regarding the progressive stenosis of his bioprosthetic aortic valve. He underwent 4V CABG in 2009 at [facility] along with AVR with a porcine bioprosthetic valve secondary to severe aortic valve disease. He has done well since then from a cardiac standpoint. He has known carotid artery disease and has undergone right CEA. Admitted to [facility] March 2017 with syncope and found to be anemic with GI bleeding, AVMs on colonoscopy. He was seen by cardiology in the hospital. Plavix stopped. Lasix for mild volume overload. Also diagnosed with DVT right leg and put on Eliquis shortly after that. Echo 07/01/15 with LVEF=40-45%, elevated gradients across the bioprosthetic AVR with mean gradient 34 mmHg. He has had no further syncope since his Hgb has been stable.   He is referred today to discuss the aortic valve stenosis. He has no chest pain, dyspnea or dizziness. LE swelling only in the right leg. He is here today with his daughter and son. He is retired. He has been driving until recently. Stopped driving after syncopal event. He does chores around the house but his family made him stop mowing the grass due to his "heart condition". He actually has felt well for the last 6 weeks with no weakness, fatigue, dyspnea or recurrent near syncope. No bleeding on Eliquis.   Primary Care Physician: ADDIS,DANIEL, DO Primary Cardiologist: Dr. Darl Householder   Past Medical History  Diagnosis Date  . Anemia     AVMs 2017  . Coronary artery disease 2009    4V CABG at [facility]  . Aortic valve disease 2009   Bioprosthetic AVR at [facility]  . DVT (deep venous thrombosis) (HCC) 2017  . Hyperlipemia     Past Surgical History  Procedure Laterality Date  . Cardiac valve surgery  2009  . Trigger finger release Left   . Leg surgery    . Colonoscopy  2015    Dr.Spainhour  . Upper gastrointestinal endoscopy  2016    Dr.Spainhour  . Appendectomy    . Eye surgery Left   . Coronary artery bypass graft    . Esophagogastroduodenoscopy N/A 06/27/2015    Procedure: ESOPHAGOGASTRODUODENOSCOPY (EGD);  Surgeon: Malissa Hippo, MD;  Location: [facility];  Service: Endoscopy;  Laterality: N/A;  255    Current Outpatient Prescriptions  Medication Sig Dispense Refill  . apixaban (ELIQUIS) 5 MG TABS tablet Take 1 tablet (5 mg total) by mouth 2 (two) times daily. 60 tablet 3  . aspirin EC 81 MG tablet Take 1 tablet (81 mg total) by mouth daily.    Marland Kitchen BIOTIN PO Take 1 tablet by mouth daily.    . calcium gluconate 500 MG tablet Take 1 tablet by mouth daily.    . clarithromycin (BIAXIN) 500 MG tablet Take 1 tablet (500 mg total) by mouth 2 (two) times daily. 20 tablet 0  . ezetimibe (ZETIA) 10 MG tablet Take 10 mg by mouth at bedtime.  11  . ferrous sulfate 325 (65 FE) MG tablet Take 1 tablet (325 mg total) by mouth daily with breakfast. 30 tablet  0  . furosemide (LASIX) 20 MG tablet Take 1.5 tablets (30 mg total) by mouth daily. 45 tablet 3  . Glucosamine-Chondroit-Vit C-Mn (GLUCOSAMINE 1500 COMPLEX) CAPS Take 1 capsule by mouth daily.     . Lactobacillus (BIOTINEX PO) Take 10,000 mcg by mouth daily.    . metoprolol succinate (TOPROL-XL) 25 MG 24 hr tablet Take 1 1/2 tablets daily 45 tablet 3  . METRONIDAZOLE, TOPICAL, (METROLOTION) 0.75 % LOTN Apply topically daily.    . nitroGLYCERIN (NITROSTAT) 0.4 MG SL tablet Place 1 tablet (0.4 mg total) under the tongue every 5 (five) minutes as needed for chest pain. Up to 3 doses. If no relief after 3rd dose, proceed to the ED for an evaluation 25 tablet 3  . omeprazole  (PRILOSEC) 40 MG capsule Take 40 mg by mouth daily.    . polyethylene glycol (MIRALAX / GLYCOLAX) packet Take 17 g by mouth daily as needed for mild constipation. 14 each 0  . predniSONE (DELTASONE) 5 MG tablet Take 5 mg by mouth daily with breakfast.    . simvastatin (ZOCOR) 40 MG tablet Take 40 mg by mouth daily.    . vitamin B-12 (CYANOCOBALAMIN) 1000 MCG tablet Take 1,000 mcg by mouth daily.    . vitamin C (ASCORBIC ACID) 500 MG tablet Take 500 mg by mouth daily.    . Vitamins-Lipotropics (LIPO-FLAVONOID PLUS) TABS Take 2 tablets by mouth daily.    . Zinc 25 MG TABS Take 1 tablet by mouth daily.      No current facility-administered medications for this visit.    Allergies  Allergen Reactions  . Penicillins Itching    Has patient had a PCN reaction causing immediate rash, facial/tongue/throat swelling, SOB or lightheadedness with hypotension: Yes Has patient had a PCN reaction causing severe rash involving mucus membranes or skin necrosis: No Has patient had a PCN reaction that required hospitalization No Has patient had a PCN reaction occurring within the last 10 years: No If all of the above answers are "NO", then may proceed with Cephalosporin use.    . Plaquenil [Hydroxychloroquine Sulfate] Itching and Other (See Comments)    redness    Social History   Social History  . Marital Status: Married    Spouse Name: N/A  . Number of Children: N/A  . Years of Education: N/A   Occupational History  . Not on file.   Social History Main Topics  . Smoking status: Former Smoker    Quit date: 06/26/1975  . Smokeless tobacco: Never Used  . Alcohol Use: No     Comment: Patient quit 40 years ago.  . Drug Use: Not on file  . Sexual Activity: Not on file   Other Topics Concern  . Not on file   Social History Narrative    Family History  Problem Relation Age of Onset  . Dementia Mother   . Heart disease Father   . Healthy Daughter   . Bipolar disorder Son     Review  of Systems:  As stated in the HPI and otherwise negative.   BP 130/60 mmHg  Pulse 83  Ht 5\' 7"  (1.702 m)  Wt 171 lb (77.565 kg)  BMI 26.78 kg/m2  SpO2 95%  Physical Examination: General: Well developed, well nourished, NAD HEENT: OP clear, mucus membranes moist SKIN: warm, dry. No rashes. Neuro: No focal deficits Musculoskeletal: Muscle strength 5/5 all ext Psychiatric: Mood and affect normal Neck: No JVD, no carotid bruits, no thyromegaly, no lymphadenopathy. Lungs:Clear bilaterally,  no wheezes, rhonci, crackles Cardiovascular: Regular rate and rhythm. Loud systolic murmur. No gallops or rubs. Abdomen:Soft. Bowel sounds present. Non-tender.  Extremities: No lower extremity edema. Pulses are 2 + in the bilateral DP/PT.  Echo 07/01/15: Left ventricle: The cavity size was normal. Wall thickness was  increased in a pattern of mild LVH. Systolic function was mildly  to moderately reduced. The estimated ejection fraction was in the  range of 40% to 45%. Diffuse hypokinesis. Doppler parameters are  consistent with abnormal left ventricular relaxation (grade 1  diastolic dysfunction). Doppler parameters are consistent with  high ventricular filling pressure. - Aortic valve: There appears to be bioprosthetic valve stenosis  with reduced leaflet excursion. Peak velocities and mean  gradients are elevated for this type of bioprosthetic valve. Peak  velocity (S): 359 cm/s. Mean gradient (S): 34 mm Hg. Valve area  (VTI): 0.77 cm^2. Valve area (Vmax): 0.78 cm^2. Valve area  (Vmean): 0.73 cm^2. - Mitral valve: Calcified annulus. Mildly thickened leaflets .  There was mild regurgitation. - Left atrium: The atrium was severely dilated. - Right ventricle: Systolic function was mildly reduced. - Right atrium: The atrium was mildly dilated. - Tricuspid valve: There was mild-moderate regurgitation. - Pulmonary arteries: Systolic pressure was mildly increased. PA  peak pressure: 41  mm Hg (S). - Systemic veins: IVC dilated with normal respiratory variation.  Estimated CVP 8 mmHg.  EKG:  EKG is not ordered today. The ekg ordered 07/14/15 shows sinus rhythm, LAFB, RBBB. I have personally reviewed this EKG.    Recent Labs: 07/01/2015: TSH 1.199 07/05/2015: Magnesium 1.9 08/18/2015: BUN 25*; Creatinine, Ser 1.19; Hemoglobin 11.0*; Platelets 252; Potassium 4.0; Sodium 134*   Lipid Panel No results found for: CHOL, TRIG, HDL, CHOLHDL, VLDL, LDLCALC, LDLDIRECT   Wt Readings from Last 3 Encounters:  08/29/15 171 lb (77.565 kg)  08/18/15 176 lb (79.833 kg)  08/13/15 176 lb (79.833 kg)     Other studies Reviewed: Additional studies/ records that were reviewed today include: . Review of the above records demonstrates:    Assessment and Plan:   1. Aortic stenosis, bioprosthetic aortic valve: He underwent AVR with bioprosthetic valve at [facility] in 2009. He now has evidence of valve failure with at least moderate stenosis of the bioprosthetic valve. I have personally reviewed his echo images and the valve leaflets are thickened and do not move well. I do not think he is symptomatic at this time. His most recent admission to [facility] March 2017 seems to be due his GI bleeding and anemia. He has felt well over the last 6 weeks with no weakness, dyspnea, chest pain or dizziness. I have discussed the nature of his aortic disease with him and his children. I do not think he is quite at the point of considering redo AVR, but when he becomes symptomatic we will certainly be able to address this. I would likely not recommend redo open TAVR. He should be a reasonable candidate for TAVR. I have reviewed the TAVR procedure in detail today with the patient and his family and have provided literature related to this procedure. I will plan to repeat his echo in September 2017 and will see him back after that. He will call if his symptoms change.   Current medicines are reviewed at length with the  patient today.  The patient does not have concerns regarding medicines.  The following changes have been made:  no change  Labs/ tests ordered today include:   Orders Placed This Encounter  Procedures  . ECHOCARDIOGRAM COMPLETE    Disposition:   FU with me in 4-5 months after his echo.   Signed, Verne Carrow, MD 08/29/2015 3:39 PM    [facility] [address] [place], [place], [place]  [address] Phone: [phone]; Fax: [phone]

## 2015-09-10 ENCOUNTER — Encounter (INDEPENDENT_AMBULATORY_CARE_PROVIDER_SITE_OTHER): Payer: Self-pay

## 2015-10-14 ENCOUNTER — Other Ambulatory Visit: Payer: Self-pay | Admitting: Adult Health

## 2015-10-27 ENCOUNTER — Ambulatory Visit: Payer: Medicare Other | Admitting: Adult Health

## 2015-10-27 ENCOUNTER — Telehealth: Payer: Self-pay | Admitting: Adult Health

## 2015-10-27 NOTE — Telephone Encounter (Signed)
Pt's wife called stating that he's needing to have a clip removed from his stomach by Dr. Karilyn Cota, would like to know what he needs to do in order to stop his Eliquis  (Routing comment)  Per Ruthine Dose   Will route to Dr Purvis Sheffield

## 2015-10-28 NOTE — Telephone Encounter (Signed)
Can hold for 24 hrs beforehand.

## 2015-10-28 NOTE — Telephone Encounter (Signed)
Wife notified.

## 2015-10-30 ENCOUNTER — Other Ambulatory Visit: Payer: Self-pay | Admitting: Adult Health

## 2015-11-10 ENCOUNTER — Ambulatory Visit (INDEPENDENT_AMBULATORY_CARE_PROVIDER_SITE_OTHER): Payer: Medicare Other | Admitting: Internal Medicine

## 2015-11-10 ENCOUNTER — Encounter (INDEPENDENT_AMBULATORY_CARE_PROVIDER_SITE_OTHER): Payer: Self-pay | Admitting: Internal Medicine

## 2015-11-10 VITALS — BP 132/80 | HR 64 | Temp 97.8°F | Ht 67.0 in | Wt 165.2 lb

## 2015-11-10 DIAGNOSIS — I6523 Occlusion and stenosis of bilateral carotid arteries: Secondary | ICD-10-CM

## 2015-11-10 DIAGNOSIS — K625 Hemorrhage of anus and rectum: Secondary | ICD-10-CM | POA: Diagnosis not present

## 2015-11-10 LAB — CBC WITH DIFFERENTIAL/PLATELET
BASOS ABS: 0 {cells}/uL (ref 0–200)
Basophils Relative: 0 %
EOS PCT: 1 %
Eosinophils Absolute: 82 cells/uL (ref 15–500)
HEMATOCRIT: 41.1 % (ref 38.5–50.0)
HEMOGLOBIN: 13.6 g/dL (ref 13.2–17.1)
LYMPHS ABS: 738 {cells}/uL — AB (ref 850–3900)
Lymphocytes Relative: 9 %
MCH: 29.6 pg (ref 27.0–33.0)
MCHC: 33.1 g/dL (ref 32.0–36.0)
MCV: 89.5 fL (ref 80.0–100.0)
MONO ABS: 574 {cells}/uL (ref 200–950)
MPV: 10.3 fL (ref 7.5–12.5)
Monocytes Relative: 7 %
NEUTROS ABS: 6806 {cells}/uL (ref 1500–7800)
NEUTROS PCT: 83 %
Platelets: 245 10*3/uL (ref 140–400)
RBC: 4.59 MIL/uL (ref 4.20–5.80)
RDW: 15.2 % — ABNORMAL HIGH (ref 11.0–15.0)
WBC: 8.2 10*3/uL (ref 3.8–10.8)

## 2015-11-10 NOTE — Patient Instructions (Signed)
CBC today. I will discuss with Dr Karilyn Cota

## 2015-11-10 NOTE — Progress Notes (Addendum)
Subjective:    Patient ID: Leon Freeman, male    DOB: 1931-10-14, 82 y.o.   MRN: [id number]  HPI Here today with c/o rectal bleeding. Seen in the ED in May and his stool was positive. Hemoglobin was stable. Underwent an EGD in March of this year for IDA secondary to chronic blood loss. EGD revealed a single 4 mm angiodysplastic lesion with bleeding on contact and found in the second portion of the duodenum.  Normal esophagus. Small hiatal hernia. Scar in the gastric antrum.  He tells me when he has a BM, his stools are dark.  No BRRB.   His appetite is good. No weight loss. He has a BM 2-3 times a day. No change in his stools.  Hx of cardiac valve replacement and CAD and maintained on Eliquis and ASA.   08/23/2002 Colonoscopy: screening, Dr. Aleene Davidson: Extensive diverticulosis of the sigmoid colon.  No retroflexion. Some mild hemorrhoidal tissue.   CBC Latest Ref Rng & Units 08/18/2015 08/13/2015 08/04/2015  WBC 4.0 - 10.5 K/uL 5.6 - 7.3  Hemoglobin 13.0 - 17.0 g/dL 11.0(L) 9.9(L) 10.2(L)  Hematocrit 39.0 - 52.0 % 36.5(L) 33.4(L) 34.2(L)  Platelets 150 - 400 K/uL 252 - 276      Review of Systems Past Medical History:  Diagnosis Date  . Anemia    AVMs 2017  . Aortic valve disease 2009   Bioprosthetic AVR at [facility]  . Coronary artery disease 2009   4V CABG at [facility]  . DVT (deep venous thrombosis) (HCC) 2017  . Hyperlipemia     Past Surgical History:  Procedure Laterality Date  . APPENDECTOMY    . CARDIAC VALVE SURGERY  2009  . COLONOSCOPY  2015   Dr.Spainhour  . CORONARY ARTERY BYPASS GRAFT    . ESOPHAGOGASTRODUODENOSCOPY N/A 06/27/2015   Procedure: ESOPHAGOGASTRODUODENOSCOPY (EGD);  Surgeon: Malissa Hippo, MD;  [facility];  Service: Endoscopy;  Laterality: N/A;  255  . EYE SURGERY Left   . LEG SURGERY    . TRIGGER FINGER RELEASE Left   . UPPER GASTROINTESTINAL ENDOSCOPY  2016   Dr.Spainhour    Allergies  Allergen Reactions  . Penicillins Itching    Has  patient had a PCN reaction causing immediate rash, facial/tongue/throat swelling, SOB or lightheadedness with hypotension: Yes Has patient had a PCN reaction causing severe rash involving mucus membranes or skin necrosis: No Has patient had a PCN reaction that required hospitalization No Has patient had a PCN reaction occurring within the last 10 years: No If all of the above answers are "NO", then may proceed with Cephalosporin use.    . Plaquenil [Hydroxychloroquine Sulfate] Itching and Other (See Comments)    redness    Current Outpatient Prescriptions on File Prior to Visit  Medication Sig Dispense Refill  . aspirin EC 81 MG tablet Take 1 tablet (81 mg total) by mouth daily.    . calcium gluconate 500 MG tablet Take 1 tablet by mouth daily.    Marland Kitchen ELIQUIS 5 MG TABS tablet TAKE 1 TABLET BY MOUTH TWICE DAILY 60 tablet 6  . ezetimibe (ZETIA) 10 MG tablet Take 10 mg by mouth at bedtime.  11  . ferrous sulfate 325 (65 FE) MG tablet Take 1 tablet (325 mg total) by mouth daily with breakfast. 30 tablet 0  . furosemide (LASIX) 20 MG tablet Take 1.5 tablets (30 mg total) by mouth daily. 45 tablet 3  . Glucosamine-Chondroit-Vit C-Mn (GLUCOSAMINE 1500 COMPLEX) CAPS Take 1 capsule by  mouth daily.     . metoprolol succinate (TOPROL-XL) 25 MG 24 hr tablet TAKE 1 AND 1/2 TABLETS BY MOUTH DAILY 45 tablet 3  . METRONIDAZOLE, TOPICAL, (METROLOTION) 0.75 % LOTN Apply topically daily.    . nitroGLYCERIN (NITROSTAT) 0.4 MG SL tablet Place 1 tablet (0.4 mg total) under the tongue every 5 (five) minutes as needed for chest pain. Up to 3 doses. If no relief after 3rd dose, proceed to the ED for an evaluation 25 tablet 3  . omeprazole (PRILOSEC) 40 MG capsule Take 40 mg by mouth daily.    . predniSONE (DELTASONE) 5 MG tablet Take 5 mg by mouth daily with breakfast.    . simvastatin (ZOCOR) 40 MG tablet Take 40 mg by mouth daily.    . vitamin B-12 (CYANOCOBALAMIN) 1000 MCG tablet Take 1,000 mcg by mouth daily.      . vitamin C (ASCORBIC ACID) 500 MG tablet Take 500 mg by mouth daily.    . Vitamins-Lipotropics (LIPO-FLAVONOID PLUS) TABS Take 2 tablets by mouth daily.    . Zinc 25 MG TABS Take 1 tablet by mouth daily.     Marland Kitchen BIOTIN PO Take 1 tablet by mouth daily.    . clarithromycin (BIAXIN) 500 MG tablet Take 1 tablet (500 mg total) by mouth 2 (two) times daily. 20 tablet 0  . Lactobacillus (BIOTINEX PO) Take 10,000 mcg by mouth daily.    . polyethylene glycol (MIRALAX / GLYCOLAX) packet Take 17 g by mouth daily as needed for mild constipation. 14 each 0   No current facility-administered medications on file prior to visit.        Objective:   Physical Exam Blood pressure 132/80, pulse 64, temperature 97.8 F (36.6 C), height 5\' 7"  (1.702 m), weight 165 lb 3.2 oz (74.9 kg). Alert and oriented. Skin warm and dry. Oral mucosa is moist.   . Sclera anicteric, conjunctivae is pink. Thyroid not enlarged. No cervical lymphadenopathy. Lungs clear. Heart regular rate and rhythm. Loud murmur heard.  Abdomen is soft. Bowel sounds are positive. No hepatomegaly. No abdominal masses felt. No tenderness.  No edema to lower extremities.   Stool brown and slightly positive.         Assessment & Plan:  Guaiac positive stool. Hx of same. Clip applied 06/2015 on EGD.  Will get a CBC and discuss with Dr. 07/2015.

## 2015-11-12 ENCOUNTER — Telehealth (INDEPENDENT_AMBULATORY_CARE_PROVIDER_SITE_OTHER): Payer: Self-pay | Admitting: Internal Medicine

## 2015-11-12 NOTE — Telephone Encounter (Signed)
Patient's spouse called, returning Terri's call, would like a call back.  [phone]

## 2015-11-13 ENCOUNTER — Encounter (INDEPENDENT_AMBULATORY_CARE_PROVIDER_SITE_OTHER): Payer: Self-pay

## 2015-11-13 ENCOUNTER — Telehealth (INDEPENDENT_AMBULATORY_CARE_PROVIDER_SITE_OTHER): Payer: Self-pay | Admitting: *Deleted

## 2015-11-13 ENCOUNTER — Other Ambulatory Visit: Payer: Self-pay | Admitting: Adult Health

## 2015-11-13 NOTE — Telephone Encounter (Signed)
I have talked with daughter 

## 2015-11-13 NOTE — Telephone Encounter (Signed)
Patient's daughter , Bayard Beaver called. She states that her parent's were unable to share with her the outcome and plan from the patient's OV with Terri on 11/11/2015.  Result of HGB/HCT was given to her. Stool guaiac positive at  OV. She says at one time Dr.Rehman had mentioned Givens Study or to Cauterize AVM. She says that she wasn't sure if that he was still bleeding or if things are stable to leave things as they are.  She may be reached at [phone]. She was advised that both providers would be made aware.

## 2015-11-13 NOTE — Telephone Encounter (Signed)
I am going to discuss Dr. Karilyn Cota

## 2015-11-19 ENCOUNTER — Telehealth (INDEPENDENT_AMBULATORY_CARE_PROVIDER_SITE_OTHER): Payer: Self-pay | Admitting: Internal Medicine

## 2015-11-19 ENCOUNTER — Other Ambulatory Visit (INDEPENDENT_AMBULATORY_CARE_PROVIDER_SITE_OTHER): Payer: Self-pay | Admitting: *Deleted

## 2015-11-19 DIAGNOSIS — K264 Chronic or unspecified duodenal ulcer with hemorrhage: Secondary | ICD-10-CM

## 2015-11-19 DIAGNOSIS — K922 Gastrointestinal hemorrhage, unspecified: Secondary | ICD-10-CM

## 2015-11-19 NOTE — Telephone Encounter (Signed)
I have spoken with Dr. Karilyn Cota, patient and wife. Hemoglobin is stable.   Will get a CBC in 4 weeks.

## 2015-11-19 NOTE — Telephone Encounter (Signed)
I discussed with Dr. Karilyn Cota. Will get a CBC in 4 weeks.   Tammy, CBC in 4 weeks.

## 2015-11-19 NOTE — Telephone Encounter (Signed)
Lab order has been noted and the patient wil be sent a letter as a reminder.

## 2015-11-19 NOTE — Telephone Encounter (Signed)
Patient's spouse Mare Loan called and stated that they want labs done in [place] and [facility].  [phone]

## 2015-11-19 NOTE — Telephone Encounter (Signed)
Let them know that will be fine but report needs to come to [place].

## 2015-11-20 NOTE — Telephone Encounter (Signed)
I called [name] back and left a message as to what Leon Freeman said.

## 2015-11-23 ENCOUNTER — Other Ambulatory Visit: Payer: Self-pay | Admitting: Adult Health

## 2015-11-24 NOTE — Telephone Encounter (Signed)
Patient,s condition discussed with her daughter Bayard Beaver over the weekend. Will continue to monitor H&H every 4 weeks. For 6 months. If he has overt bleeding and or need for blood transfusion will consider repeat EGD.

## 2015-11-26 ENCOUNTER — Other Ambulatory Visit (INDEPENDENT_AMBULATORY_CARE_PROVIDER_SITE_OTHER): Payer: Self-pay | Admitting: *Deleted

## 2015-11-26 DIAGNOSIS — D508 Other iron deficiency anemias: Secondary | ICD-10-CM

## 2015-11-26 NOTE — Telephone Encounter (Signed)
Lab has been ordered for September. 1/6.

## 2015-12-01 ENCOUNTER — Other Ambulatory Visit (INDEPENDENT_AMBULATORY_CARE_PROVIDER_SITE_OTHER): Payer: Self-pay | Admitting: *Deleted

## 2015-12-01 ENCOUNTER — Encounter (INDEPENDENT_AMBULATORY_CARE_PROVIDER_SITE_OTHER): Payer: Self-pay | Admitting: *Deleted

## 2015-12-01 DIAGNOSIS — K922 Gastrointestinal hemorrhage, unspecified: Secondary | ICD-10-CM

## 2015-12-01 DIAGNOSIS — D508 Other iron deficiency anemias: Secondary | ICD-10-CM

## 2015-12-09 ENCOUNTER — Telehealth (INDEPENDENT_AMBULATORY_CARE_PROVIDER_SITE_OTHER): Payer: Self-pay | Admitting: Pediatrics

## 2015-12-09 NOTE — Telephone Encounter (Signed)
Pts wife called inquiring as to which lab we sent pts orders to. I advised her I do not see the lab was faxed. I will do so. I advised her to take pt to [facility] [phone], fax# [phone]. I faxed to orders for CBC, Hgb, and HCT.

## 2015-12-12 ENCOUNTER — Encounter (INDEPENDENT_AMBULATORY_CARE_PROVIDER_SITE_OTHER): Payer: Self-pay

## 2015-12-15 ENCOUNTER — Ambulatory Visit (INDEPENDENT_AMBULATORY_CARE_PROVIDER_SITE_OTHER): Payer: Medicare Other | Admitting: Cardiovascular Disease

## 2015-12-15 ENCOUNTER — Encounter: Payer: Self-pay | Admitting: Cardiovascular Disease

## 2015-12-15 VITALS — BP 114/64 | HR 58 | Ht 67.0 in | Wt 169.0 lb

## 2015-12-15 DIAGNOSIS — I6523 Occlusion and stenosis of bilateral carotid arteries: Secondary | ICD-10-CM

## 2015-12-15 DIAGNOSIS — I5022 Chronic systolic (congestive) heart failure: Secondary | ICD-10-CM | POA: Diagnosis not present

## 2015-12-15 DIAGNOSIS — I25768 Atherosclerosis of bypass graft of coronary artery of transplanted heart with other forms of angina pectoris: Secondary | ICD-10-CM

## 2015-12-15 DIAGNOSIS — I82402 Acute embolism and thrombosis of unspecified deep veins of left lower extremity: Secondary | ICD-10-CM

## 2015-12-15 DIAGNOSIS — I350 Nonrheumatic aortic (valve) stenosis: Secondary | ICD-10-CM

## 2015-12-15 NOTE — Progress Notes (Signed)
SUBJECTIVE: The patient presents for follow-up of bioprosthetic aortic valve stenosis and CABG. He also has a history of right carotid endarterectomy and right peroneal vein DVT and is on apixaban.  Echocardiogram 07/01/15 showed mild to moderately reduced left ventricular systolic function, EF 40-45%, grade 1 diastolic dysfunction with high filling pressures, bioprosthetic aortic valve stenosis, peak velocity 3.59 m/s, mean gradient 34 mmHg, valve area 0.77 cm, mild mitral and mild to moderate tricuspid regurgitation, severe left atrial dilatation, mild right atrial dilatation, and mildly reduced right ventricular systolic function.  Saw Dr. Clifton James who felt pt was asymptomatic at this time, but would likely be a TAVR candidate in the future.  Denies chest pain and SOB. Works out at [employer] 3 days/week.   Review of Systems: As per "subjective", otherwise negative.  Allergies  Allergen Reactions  . Penicillins Itching    Has patient had a PCN reaction causing immediate rash, facial/tongue/throat swelling, SOB or lightheadedness with hypotension: Yes Has patient had a PCN reaction causing severe rash involving mucus membranes or skin necrosis: No Has patient had a PCN reaction that required hospitalization No Has patient had a PCN reaction occurring within the last 10 years: No If all of the above answers are "NO", then may proceed with Cephalosporin use.    . Plaquenil [Hydroxychloroquine Sulfate] Itching and Other (See Comments)    redness    Current Outpatient Prescriptions  Medication Sig Dispense Refill  . aspirin EC 81 MG tablet Take 1 tablet (81 mg total) by mouth daily.    Marland Kitchen BIOTIN PO Take 1 tablet by mouth daily.    . calcium gluconate 500 MG tablet Take 1 tablet by mouth daily.    . cholecalciferol (VITAMIN D) 400 units TABS tablet Take 2,000 Units by mouth.    Everlene Balls 5 MG TABS tablet TAKE 1 TABLET BY MOUTH TWICE DAILY 60 tablet 6  . ezetimibe  (ZETIA) 10 MG tablet Take 10 mg by mouth at bedtime.  11  . ferrous sulfate 325 (65 FE) MG tablet TAKE ONE TABLET BY MOUTH ONCE DAILY WITH BREAKFAST 30 tablet 3  . furosemide (LASIX) 20 MG tablet TAKE 1.5 TABLETS (30 MG TOTAL) BY MOUTH DAILY ** DOSE CHANGE ** 45 tablet 6  . Glucosamine-Chondroit-Vit C-Mn (GLUCOSAMINE 1500 COMPLEX) CAPS Take 1 capsule by mouth daily.     . metoprolol succinate (TOPROL-XL) 25 MG 24 hr tablet TAKE 1 AND 1/2 TABLETS BY MOUTH DAILY 45 tablet 3  . METRONIDAZOLE, TOPICAL, (METROLOTION) 0.75 % LOTN Apply topically daily.    . nitroGLYCERIN (NITROSTAT) 0.4 MG SL tablet Place 1 tablet (0.4 mg total) under the tongue every 5 (five) minutes as needed for chest pain. Up to 3 doses. If no relief after 3rd dose, proceed to the ED for an evaluation 25 tablet 3  . omeprazole (PRILOSEC) 40 MG capsule Take 40 mg by mouth daily.    . predniSONE (DELTASONE) 5 MG tablet Take 5 mg by mouth daily with breakfast.    . simvastatin (ZOCOR) 40 MG tablet Take 40 mg by mouth daily.    . traMADol (ULTRAM) 50 MG tablet Take by mouth every 6 (six) hours as needed.    . vitamin B-12 (CYANOCOBALAMIN) 1000 MCG tablet Take 1,000 mcg by mouth daily.    . vitamin C (ASCORBIC ACID) 500 MG tablet Take 500 mg by mouth daily.    . Vitamins-Lipotropics (LIPO-FLAVONOID PLUS) TABS Take 2 tablets by mouth daily.    Marland Kitchen  Zinc 25 MG TABS Take 2 tablets by mouth daily.      No current facility-administered medications for this visit.     Past Medical History:  Diagnosis Date  . Anemia    AVMs 2017  . Aortic valve disease 2009   Bioprosthetic AVR at [facility]  . Coronary artery disease 2009   4V CABG at [facility]  . DVT (deep venous thrombosis) (HCC) 2017  . Hyperlipemia     Past Surgical History:  Procedure Laterality Date  . APPENDECTOMY    . CARDIAC VALVE SURGERY  2009  . COLONOSCOPY  2015   Dr.Spainhour  . CORONARY ARTERY BYPASS GRAFT    . ESOPHAGOGASTRODUODENOSCOPY N/A 06/27/2015   Procedure:  ESOPHAGOGASTRODUODENOSCOPY (EGD);  Surgeon: Malissa Hippo, MD;  Location[facility];  Service: Endoscopy;  Laterality: N/A;  255  . EYE SURGERY Left   . LEG SURGERY    . TRIGGER FINGER RELEASE Left   . UPPER GASTROINTESTINAL ENDOSCOPY  2016   Dr.Spainhour    Social History   Social History  . Marital status: Married    Spouse name: N/A  . Number of children: N/A  . Years of education: N/A   Occupational History  . Not on file.   Social History Main Topics  . Smoking status: Former Smoker    Packs/day: 1.00    Years: 24.00    Types: Cigarettes    Start date: 05/17/1951    Quit date: 06/26/1975  . Smokeless tobacco: Never Used  . Alcohol use No     Comment: Patient quit 40 years ago.  . Drug use: Unknown  . Sexual activity: Not on file   Other Topics Concern  . Not on file   Social History Narrative  . No narrative on file     Vitals:   12/15/15 1346  BP: 114/64  Pulse: (!) 58  Weight: 169 lb (76.7 kg)  Height: 5\' 7"  (1.702 m)    PHYSICAL EXAM General appearance: alert, cooperative and no distress Neck: no JVD Lungs: Clear, no rales. Diminished at bases. Heart: Regular rate and rhythm and 2/6 systolic murmur at RUSB Extremities: No pedal edema. Neurologic: Grossly normal Skin: Normal. Musculoskeletal: Normal range of motion.    ECG: Most recent ECG reviewed.      ASSESSMENT AND PLAN: 1. CAD s/p CABG: Stable. Continue aspirin, statin, metoprolol, and Zetia.  2. Bioprosthetic aortic valve stenosis: Asymptomatic. May eventually undergo TAVR once disease state progresses.  3. Right leg DVT: Continue Eliquis.  4. Chronic systolic heart failure: Euvolemic on Lasix 30 mg daily. No changes.  5. Carotid artery stenosis s/p right CEA: Continue aspirin and statin therapy. Dopplers 06/30/15 <50% RICA and 50-69% LICA stenosis. Repeat in one year.  Dispo: fu 6 months.   07/02/15, M.D., F.A.C.C.

## 2015-12-15 NOTE — Patient Instructions (Signed)
Your physician wants you to follow-up in: 6 months with Dr Koneswaran You will receive a reminder letter in the mail two months in advance. If you don't receive a letter, please call our office to schedule the follow-up appointment.    Your physician recommends that you continue on your current medications as directed. Please refer to the Current Medication list given to you today.      If you need a refill on your cardiac medications before your next appointment, please call your pharmacy.      Thank you for choosing [facility] !        

## 2015-12-22 ENCOUNTER — Other Ambulatory Visit: Payer: Self-pay | Admitting: Cardiovascular Disease

## 2015-12-29 ENCOUNTER — Encounter (INDEPENDENT_AMBULATORY_CARE_PROVIDER_SITE_OTHER): Payer: Self-pay | Admitting: *Deleted

## 2015-12-29 ENCOUNTER — Other Ambulatory Visit (INDEPENDENT_AMBULATORY_CARE_PROVIDER_SITE_OTHER): Payer: Self-pay | Admitting: *Deleted

## 2015-12-29 DIAGNOSIS — D508 Other iron deficiency anemias: Secondary | ICD-10-CM

## 2015-12-29 DIAGNOSIS — K922 Gastrointestinal hemorrhage, unspecified: Secondary | ICD-10-CM

## 2015-12-31 ENCOUNTER — Ambulatory Visit (INDEPENDENT_AMBULATORY_CARE_PROVIDER_SITE_OTHER): Payer: Medicare Other

## 2015-12-31 ENCOUNTER — Other Ambulatory Visit: Payer: Self-pay

## 2015-12-31 DIAGNOSIS — I350 Nonrheumatic aortic (valve) stenosis: Secondary | ICD-10-CM | POA: Diagnosis not present

## 2015-12-31 LAB — ECHOCARDIOGRAM COMPLETE
AOVTI: 75.6 cm
AV Mean grad: 26 mmHg
AV Peak grad: 46 mmHg
AV VEL mean LVOT/AV: 0.26
AV pk vel: 338 cm/s
Ao pk vel: 0.26 m/s
DOP CAL AO MEAN VELOCITY: 235 cm/s
E decel time: 225 msec
E/e' ratio: 12.04
FS: 38 % (ref 28–44)
IVS/LV PW RATIO, ED: 0.9
LA diam index: 1.91 cm/m2
LA vol index: 31.3 mL/m2
LASIZE: 36 mm
LAVOL: 58.8 mL
LAVOLA4C: 64.5 mL
LEFT ATRIUM END SYS DIAM: 36 mm
LV E/e' medial: 12.04
LV SIMPSON'S DISK: 55
LV TDI E'LATERAL: 7.98
LV TDI E'MEDIAL: 5.43
LV dias vol index: 35 mL/m2
LV e' LATERAL: 7.98 cm/s
LV sys vol index: 16 mL/m2
LV sys vol: 30 mL (ref 21–61)
LVDIAVOL: 67 mL (ref 62–150)
LVEEAVG: 12.04
LVOT VTI: 22.7 cm
LVOT peak VTI: 0.3 cm
LVOTPV: 89.2 cm/s
MV Dec: 225
MV Peak grad: 4 mmHg
MVPKAVEL: 145 m/s
MVPKEVEL: 96.1 m/s
PW: 11.3 mm — AB (ref 0.6–1.1)
RV sys press: 23 mmHg
Reg peak vel: 225 cm/s
Stroke v: 36 ml
TAPSE: 13.3 mm
TR max vel: 225 cm/s

## 2016-01-02 ENCOUNTER — Encounter: Payer: Self-pay | Admitting: Cardiovascular Disease

## 2016-01-02 ENCOUNTER — Ambulatory Visit (INDEPENDENT_AMBULATORY_CARE_PROVIDER_SITE_OTHER): Payer: Medicare Other | Admitting: Cardiovascular Disease

## 2016-01-02 VITALS — BP 110/80 | HR 86 | Ht 67.0 in | Wt 167.4 lb

## 2016-01-02 DIAGNOSIS — I6523 Occlusion and stenosis of bilateral carotid arteries: Secondary | ICD-10-CM

## 2016-01-02 DIAGNOSIS — I350 Nonrheumatic aortic (valve) stenosis: Secondary | ICD-10-CM | POA: Diagnosis not present

## 2016-01-02 NOTE — Progress Notes (Signed)
TAVR Clinic Note  Referring Provider: Dr. Darl Householder  Chief Complaint  Patient presents with  . Coronary Artery Disease     History of Present Illness: 82 yo male with history of CAD s/p CABG in 2009, aortic valve disease s/p AVR in 2009 with porcine bioprosthetic aortic valve, HLD, DVT on Eliquis, carotid artery disease who is here today for follow up in the valve clinic. I saw him in May 2017 in the valve clinic to discuss the stenosis of his bioprosthetic aortic valve. He was asymptomatic at that time and we elected to follow his AS with serial echocardiograms and examinations. He is followed in our practice by Dr. Darl Householder. He underwent 4V CABG in 2009 at [facility] along with AVR with a porcine bioprosthetic valve secondary to severe aortic valve disease. He has done well since then from a cardiac standpoint. He has known carotid artery disease and has undergone right CEA. Admitted to [facility] March 2017 with syncope and found to be anemic with GI bleeding, AVMs on colonoscopy. He was seen by cardiology in the hospital. Plavix stopped. Lasix for mild volume overload. Also diagnosed with DVT right leg and put on Eliquis shortly after that. Echo 07/01/15 with LVEF=40-45%, elevated gradients across the bioprosthetic AVR with mean gradient 34 mmHg. He has had no further syncope since his Hgb has been stable. Repeat echo 12/31/15 with LVEF=60-65% with stenosis of the bioprosthetic aortic valve. The mean gradient is now 26 mm Hg across the valve, peak gradient is 46 mm Hg.    He is here today for follow up. He has no chest pain, dyspnea or dizziness. He is exercising 3 days per week. He is asking if he can stop his Eliquis and go back on Plavix.    Primary Care Physician: ADDIS,DANIEL, DO Primary Cardiologist: Dr. Darl Householder   Past Medical History:  Diagnosis Date  . Anemia    AVMs 2017  . Aortic valve disease 2009   Bioprosthetic AVR at [facility]  . Coronary artery disease 2009   4V CABG at  [facility]  . DVT (deep venous thrombosis) (HCC) 2017  . Hyperlipemia     Past Surgical History:  Procedure Laterality Date  . APPENDECTOMY    . CARDIAC VALVE SURGERY  2009  . COLONOSCOPY  2015   Dr.Spainhour  . CORONARY ARTERY BYPASS GRAFT    . ESOPHAGOGASTRODUODENOSCOPY N/A 06/27/2015   Procedure: ESOPHAGOGASTRODUODENOSCOPY (EGD);  Surgeon: Malissa Hippo, MD;  Location[facility];  Service: Endoscopy;  Laterality: N/A;  255  . EYE SURGERY Left   . LEG SURGERY    . TRIGGER FINGER RELEASE Left   . UPPER GASTROINTESTINAL ENDOSCOPY  2016   Dr.Spainhour    Current Outpatient Prescriptions  Medication Sig Dispense Refill  . aspirin EC 81 MG tablet Take 1 tablet (81 mg total) by mouth daily.    Marland Kitchen BIOTIN PO Take 1 tablet by mouth daily.    . calcium gluconate 500 MG tablet Take 1 tablet by mouth daily.    . cholecalciferol (VITAMIN D) 400 units TABS tablet Take 2,000 Units by mouth.    Everlene Balls 5 MG TABS tablet TAKE 1 TABLET BY MOUTH TWICE DAILY 60 tablet 6  . ezetimibe (ZETIA) 10 MG tablet Take 10 mg by mouth at bedtime.  11  . ferrous sulfate 325 (65 FE) MG tablet TAKE ONE TABLET BY MOUTH ONCE DAILY WITH BREAKFAST 30 tablet 3  . furosemide (LASIX) 20 MG tablet TAKE 1.5 TABLETS (30 MG  TOTAL) BY MOUTH DAILY ** DOSE CHANGE ** 45 tablet 6  . Glucosamine-Chondroit-Vit C-Mn (GLUCOSAMINE 1500 COMPLEX) CAPS Take 1 capsule by mouth daily.     . metoprolol succinate (TOPROL-XL) 25 MG 24 hr tablet TAKE 1 AND 1/2 TABLETS BY MOUTH DAILY 45 tablet 3  . METRONIDAZOLE, TOPICAL, (METROLOTION) 0.75 % LOTN Apply topically daily.    . nitroGLYCERIN (NITROSTAT) 0.4 MG SL tablet Place 1 tablet (0.4 mg total) under the tongue every 5 (five) minutes as needed for chest pain. Up to 3 doses. If no relief after 3rd dose, proceed to the ED for an evaluation 25 tablet 3  . omeprazole (PRILOSEC) 40 MG capsule Take 40 mg by mouth daily.    . predniSONE (DELTASONE) 5 MG tablet Take 5 mg by mouth daily with  breakfast.    . simvastatin (ZOCOR) 40 MG tablet TAKE ONE TABLET BY MOUTH NIGHTLY AT BEDTIME 30 tablet 6  . traMADol (ULTRAM) 50 MG tablet Take 1-2 tablets by mouth every 8 (eight) hours as needed for pain.    . vitamin B-12 (CYANOCOBALAMIN) 1000 MCG tablet Take 1,000 mcg by mouth daily.    . vitamin C (ASCORBIC ACID) 500 MG tablet Take 500 mg by mouth daily.    . Vitamins-Lipotropics (LIPO-FLAVONOID PLUS) TABS Take 2 tablets by mouth daily.    . Zinc 25 MG TABS Take 2 tablets by mouth daily.      No current facility-administered medications for this visit.     Allergies  Allergen Reactions  . Penicillins Itching    Has patient had a PCN reaction causing immediate rash, facial/tongue/throat swelling, SOB or lightheadedness with hypotension: Yes Has patient had a PCN reaction causing severe rash involving mucus membranes or skin necrosis: No Has patient had a PCN reaction that required hospitalization No Has patient had a PCN reaction occurring within the last 10 years: No If all of the above answers are "NO", then may proceed with Cephalosporin use.    . Plaquenil [Hydroxychloroquine Sulfate] Itching and Other (See Comments)    redness    Social History   Social History  . Marital status: Married    Spouse name: N/A  . Number of children: N/A  . Years of education: N/A   Occupational History  . Not on file.   Social History Main Topics  . Smoking status: Former Smoker    Packs/day: 1.00    Years: 24.00    Types: Cigarettes    Start date: 05/17/1951    Quit date: 06/26/1975  . Smokeless tobacco: Never Used  . Alcohol use No     Comment: Patient quit 40 years ago.  . Drug use: Unknown  . Sexual activity: Not on file   Other Topics Concern  . Not on file   Social History Narrative  . No narrative on file    Family History  Problem Relation Age of Onset  . Dementia Mother   . Heart disease Father   . Healthy Daughter   . Bipolar disorder Son     Review of  Systems:  As stated in the HPI and otherwise negative.   BP 110/80   Pulse 86   Ht 5\' 7"  (1.702 m)   Wt 167 lb 6.4 oz (75.9 kg)   BMI 26.22 kg/m   Physical Examination: General: Well developed, well nourished, NAD  HEENT: OP clear, mucus membranes moist  SKIN: warm, dry. No rashes. Neuro: No focal deficits  Musculoskeletal: Muscle strength 5/5 all ext  Psychiatric: Mood and affect normal  Neck: No JVD, no carotid bruits, no thyromegaly, no lymphadenopathy.  Lungs:Clear bilaterally, no wheezes, rhonci, crackles Cardiovascular: Regular rate and rhythm. Loud systolic murmur. No gallops or rubs. Abdomen:Soft. Bowel sounds present. Non-tender.  Extremities: No lower extremity edema. Pulses are 2 + in the bilateral DP/PT.  Echo 12/31/15: Left ventricle: The cavity size was normal. Wall thickness was   increased in a pattern of mild LVH. Systolic function was normal.   The estimated ejection fraction was in the range of 60% to 65%.   Wall motion was normal; there were no regional wall motion   abnormalities. Doppler parameters are consistent with abnormal   left ventricular relaxation (grade 1 diastolic dysfunction). - Aortic valve: There appears to be bioprosthetic valve stenosis   with reduced leaflet excursion. Peak velocities and mean   gradients are elevated for this type of bioprosthetic valve. Peak   velocity (S): 338 cm/s. Mean gradient (S): 26 mm Hg. - Mitral valve: Calcified annulus. Mildly thickened, mildly   calcified leaflets . There was mild regurgitation. - Left atrium: The atrium was moderately dilated. - Right ventricle: The cavity size was mildly dilated. Systolic   function was moderately reduced. - Tricuspid valve: There was mild regurgitation.  EKG:  EKG is not ordered today.   Recent Labs: 07/01/2015: TSH 1.199 07/05/2015: Magnesium 1.9 08/18/2015: BUN 25; Creatinine, Ser 1.19; Potassium 4.0; Sodium 134 11/10/2015: Hemoglobin 13.6; Platelets 245   Lipid  Panel No results found for: CHOL, TRIG, HDL, CHOLHDL, VLDL, LDLCALC, LDLDIRECT   Wt Readings from Last 3 Encounters:  01/02/16 167 lb 6.4 oz (75.9 kg)  12/15/15 169 lb (76.7 kg)  11/10/15 165 lb 3.2 oz (74.9 kg)     Other studies Reviewed: Additional studies/ records that were reviewed today include: . Review of the above records demonstrates:    Assessment and Plan:   1. Aortic stenosis, bioprosthetic aortic valve: He underwent AVR with bioprosthetic valve at [facility] in 2009. He now has evidence of valve failure with at least moderate stenosis of the bioprosthetic valve. This has not changed from echo in march 2017. I have personally reviewed his echo images and the valve leaflets are thickened and do not move well. He remains asymptomatic at this time. His  admission to [facility] March 2017 seems to be due his GI bleeding and anemia. He has felt well since then. I have discussed the nature of his aortic disease with him, his wife and his daughter today. I do not think he is at the point of considering redo AVR, but when he becomes symptomatic we will certainly be able to address this. I would likely not recommend redo open TAVR. He should be a reasonable candidate for TAVR. I have reviewed the TAVR procedure in detail today with the patient and his family and have provided literature related to this procedure. I will plan to repeat his echo in March 2018. He will call if his symptoms change.   Current medicines are reviewed at length with the patient today.  The patient does not have concerns regarding medicines.  The following changes have been made:  no change  Labs/ tests ordered today include:   Orders Placed This Encounter  Procedures  . ECHOCARDIOGRAM COMPLETE    Disposition:   FU with me in 6 months after his echo.   Signed, Verne Carrow, MD 01/02/2016 3:48 PM    [facility] [address] [place], [place], [place]  [address] Phone: [phone]; Fax:  [phone]

## 2016-01-02 NOTE — Patient Instructions (Signed)
Medication Instructions:  Your physician recommends that you continue on your current medications as directed. Please refer to the Current Medication list given to you today.   Labwork: none  Testing/Procedures: Your physician has requested that you have an echocardiogram. Echocardiography is a painless test that uses sound waves to create images of your heart. It provides your doctor with information about the size and shape of your heart and how well your heart's chambers and valves are working. This procedure takes approximately one hour. There are no restrictions for this procedure. To be done in 6 months.     Follow-Up: Your physician wants you to follow-up in: 6 months--week or so after echo.  You will receive a reminder letter in the mail two months in advance. If you don't receive a letter, please call our office to schedule the follow-up appointment.   Any Other Special Instructions Will Be Listed Below (If Applicable).     If you need a refill on your cardiac medications before your next appointment, please call your pharmacy.   

## 2016-01-19 ENCOUNTER — Telehealth: Payer: Self-pay | Admitting: Cardiovascular Disease

## 2016-01-19 NOTE — Telephone Encounter (Signed)
LM on daughter vm to stay on Eliquis for now and not start plavix

## 2016-01-19 NOTE — Telephone Encounter (Signed)
Will forward to Dr. Koneswaran  

## 2016-01-19 NOTE — Telephone Encounter (Signed)
Daughter called stating that she was told that he would be going off Eliquis and going back on Plavix.

## 2016-01-19 NOTE — Telephone Encounter (Signed)
Would stay on Eliquis for now, don't start Plavix.

## 2016-01-20 ENCOUNTER — Telehealth: Payer: Self-pay

## 2016-01-20 DIAGNOSIS — I82401 Acute embolism and thrombosis of unspecified deep veins of right lower extremity: Secondary | ICD-10-CM

## 2016-01-20 NOTE — Telephone Encounter (Signed)
        Obtain right lower extremity venous Doppler to assess for resolution of DVT. If it has indeed resolved, will stop Eliquis and switch to Plavix.   Per Dr Purvis Sheffield Order placed will message front desk to schedule

## 2016-01-22 ENCOUNTER — Other Ambulatory Visit: Payer: Self-pay | Admitting: *Deleted

## 2016-01-22 MED ORDER — SIMVASTATIN 40 MG PO TABS: 40.0000 mg | ORAL_TABLET | Freq: Every day | ORAL | 6 refills | Status: DC

## 2016-01-22 MED ORDER — SIMVASTATIN 40 MG PO TABS: 40.0000 mg | ORAL_TABLET | Freq: Every day | ORAL | 3 refills | Status: DC

## 2016-01-23 ENCOUNTER — Ambulatory Visit (HOSPITAL_COMMUNITY): Admission: RE | Admit: 2016-01-23 | Payer: Medicare Other | Source: Ambulatory Visit

## 2016-01-23 ENCOUNTER — Other Ambulatory Visit: Payer: Self-pay

## 2016-01-23 MED ORDER — SIMVASTATIN 40 MG PO TABS: 40.0000 mg | ORAL_TABLET | Freq: Every day | ORAL | 3 refills | Status: DC

## 2016-01-23 NOTE — Telephone Encounter (Signed)
zocor refilled at request of pharmacy

## 2016-01-28 ENCOUNTER — Ambulatory Visit (HOSPITAL_COMMUNITY)
Admission: RE | Admit: 2016-01-28 | Discharge: 2016-01-28 | Disposition: A | Discharge status: Home / Self Care | Payer: Medicare Other | Source: Ambulatory Visit | Attending: Cardiovascular Disease | Admitting: Cardiovascular Disease

## 2016-01-28 DIAGNOSIS — I82591 Chronic embolism and thrombosis of other specified deep vein of right lower extremity: Secondary | ICD-10-CM | POA: Diagnosis not present

## 2016-01-28 DIAGNOSIS — I82401 Acute embolism and thrombosis of unspecified deep veins of right lower extremity: Secondary | ICD-10-CM

## 2016-01-29 ENCOUNTER — Other Ambulatory Visit (INDEPENDENT_AMBULATORY_CARE_PROVIDER_SITE_OTHER): Payer: Self-pay | Admitting: *Deleted

## 2016-01-29 ENCOUNTER — Encounter (INDEPENDENT_AMBULATORY_CARE_PROVIDER_SITE_OTHER): Payer: Self-pay | Admitting: *Deleted

## 2016-01-29 DIAGNOSIS — K922 Gastrointestinal hemorrhage, unspecified: Secondary | ICD-10-CM

## 2016-01-29 DIAGNOSIS — D508 Other iron deficiency anemias: Secondary | ICD-10-CM

## 2016-02-16 ENCOUNTER — Other Ambulatory Visit: Payer: Self-pay | Admitting: Cardiovascular Disease

## 2016-02-17 ENCOUNTER — Telehealth: Payer: Self-pay | Admitting: Cardiovascular Disease

## 2016-02-17 NOTE — Telephone Encounter (Signed)
Patient has experienced nose bleeds every day since starting eliquis

## 2016-02-17 NOTE — Telephone Encounter (Signed)
Pt says for the last 6 months has nosebleeds 1-2 times weekly but they are controlled quickly with compression. Says he also has dark stools but is followed by Dr. Karilyn Cota - cbc in September was normal. Wife mostly concerned by what she read about Eliquis and letting cardiologist know about nose bleeds. Pt denies any other symptoms, bleeding or bruising. Will forward to covering provider Dr. Purvis Sheffield out of office.

## 2016-02-18 NOTE — Telephone Encounter (Signed)
As long as nose bleeds are easily controlled and Dr. Karilyn Cota is watching CBC closely would continue Eliquis. Make f/u with Dr. Purvis Sheffield to further discuss.

## 2016-02-19 NOTE — Telephone Encounter (Signed)
Returning call.

## 2016-02-19 NOTE — Telephone Encounter (Signed)
Pt aware - made f/u appt with Dr. Purvis Sheffield

## 2016-02-23 ENCOUNTER — Encounter (INDEPENDENT_AMBULATORY_CARE_PROVIDER_SITE_OTHER): Payer: Self-pay | Admitting: *Deleted

## 2016-02-23 ENCOUNTER — Other Ambulatory Visit (INDEPENDENT_AMBULATORY_CARE_PROVIDER_SITE_OTHER): Payer: Self-pay | Admitting: *Deleted

## 2016-02-23 DIAGNOSIS — K922 Gastrointestinal hemorrhage, unspecified: Secondary | ICD-10-CM

## 2016-02-23 DIAGNOSIS — D508 Other iron deficiency anemias: Secondary | ICD-10-CM

## 2016-03-02 ENCOUNTER — Other Ambulatory Visit: Payer: Self-pay | Admitting: Cardiovascular Disease

## 2016-03-11 ENCOUNTER — Telehealth: Payer: Self-pay

## 2016-03-11 NOTE — Telephone Encounter (Signed)
-----   Message from Nori Riis, RN sent at 02/23/2016  1:32 PM EST ----- Regarding: need le venous doppler Schedule le venous doppler clot

## 2016-03-12 ENCOUNTER — Telehealth: Payer: Self-pay | Admitting: *Deleted

## 2016-03-12 NOTE — Telephone Encounter (Signed)
Pt is due for echo and follow up with Dr. Clifton James in March 2018. I placed call to pt to schedule these.  Left message to call back

## 2016-03-12 NOTE — Telephone Encounter (Signed)
I spoke with pt's wife and made appt for pt to see Dr. Clifton James on March 30,2018.  I told pt's wife [place] office would contact them to schedule echo in March prior to March 30th appt.

## 2016-03-17 NOTE — Telephone Encounter (Signed)
Echo has been scheduled for 06/16/16

## 2016-03-24 ENCOUNTER — Other Ambulatory Visit (INDEPENDENT_AMBULATORY_CARE_PROVIDER_SITE_OTHER): Payer: Self-pay | Admitting: Internal Medicine

## 2016-03-25 LAB — HEMOGLOBIN: Hemoglobin: 12.6 g/dL — ABNORMAL LOW (ref 13.0–17.7)

## 2016-03-25 LAB — HEMATOCRIT: Hematocrit: 38.3 % (ref 37.5–51.0)

## 2016-03-31 ENCOUNTER — Encounter (INDEPENDENT_AMBULATORY_CARE_PROVIDER_SITE_OTHER): Payer: Self-pay | Admitting: *Deleted

## 2016-03-31 ENCOUNTER — Other Ambulatory Visit (INDEPENDENT_AMBULATORY_CARE_PROVIDER_SITE_OTHER): Payer: Self-pay | Admitting: *Deleted

## 2016-03-31 DIAGNOSIS — D508 Other iron deficiency anemias: Secondary | ICD-10-CM

## 2016-03-31 DIAGNOSIS — K922 Gastrointestinal hemorrhage, unspecified: Secondary | ICD-10-CM

## 2016-03-31 DIAGNOSIS — D509 Iron deficiency anemia, unspecified: Secondary | ICD-10-CM

## 2016-04-06 ENCOUNTER — Ambulatory Visit: Payer: Medicare Other | Admitting: Cardiovascular Disease

## 2016-04-13 ENCOUNTER — Encounter (INDEPENDENT_AMBULATORY_CARE_PROVIDER_SITE_OTHER): Payer: Self-pay

## 2016-04-16 ENCOUNTER — Other Ambulatory Visit: Payer: Self-pay | Admitting: Adult Health

## 2016-04-29 ENCOUNTER — Encounter (INDEPENDENT_AMBULATORY_CARE_PROVIDER_SITE_OTHER): Payer: Self-pay | Admitting: *Deleted

## 2016-04-29 ENCOUNTER — Other Ambulatory Visit (INDEPENDENT_AMBULATORY_CARE_PROVIDER_SITE_OTHER): Payer: Self-pay | Admitting: *Deleted

## 2016-04-29 DIAGNOSIS — K922 Gastrointestinal hemorrhage, unspecified: Secondary | ICD-10-CM

## 2016-04-29 DIAGNOSIS — D508 Other iron deficiency anemias: Secondary | ICD-10-CM

## 2016-05-06 ENCOUNTER — Telehealth (INDEPENDENT_AMBULATORY_CARE_PROVIDER_SITE_OTHER): Payer: Self-pay | Admitting: Internal Medicine

## 2016-05-06 NOTE — Telephone Encounter (Signed)
Patient's spouse, Celcia called, stated that Leon Freeman needs to have his labs done at [facility] in [place].  [phone]

## 2016-05-13 NOTE — Telephone Encounter (Signed)
Talked with the patient. He states that he just goes to the lab,[facility], and they draw his blood. He says that they know what to drawn and whom they are drawing it for. Patient was advised that if they needed anything differently from [place] , to call our office.

## 2016-05-16 ENCOUNTER — Other Ambulatory Visit: Payer: Self-pay | Admitting: Cardiovascular Disease

## 2016-05-25 ENCOUNTER — Other Ambulatory Visit: Payer: Self-pay

## 2016-05-25 ENCOUNTER — Telehealth: Payer: Self-pay

## 2016-05-25 DIAGNOSIS — I82401 Acute embolism and thrombosis of unspecified deep veins of right lower extremity: Secondary | ICD-10-CM

## 2016-05-25 NOTE — Telephone Encounter (Signed)
Repeat Lower extremity US due early March,order placed,Terry Goins to schedule, per Dr Purvis Sheffield   Wife's number not in order, LM with daughter Bayard Beaver to call and schedule apt

## 2016-05-25 NOTE — Telephone Encounter (Signed)
-----   Message from Nori Riis, RN sent at 02/23/2016  1:32 PM EST ----- Regarding: need le venous doppler Schedule le venous doppler clot

## 2016-06-06 ENCOUNTER — Other Ambulatory Visit: Payer: Self-pay | Admitting: Cardiovascular Disease

## 2016-06-08 ENCOUNTER — Encounter: Payer: Self-pay | Admitting: Cardiovascular Disease

## 2016-06-08 ENCOUNTER — Ambulatory Visit (HOSPITAL_COMMUNITY)
Admission: RE | Admit: 2016-06-08 | Discharge: 2016-06-08 | Disposition: A | Discharge status: Home / Self Care | Payer: Medicare Other | Source: Ambulatory Visit | Attending: Cardiovascular Disease | Admitting: Cardiovascular Disease

## 2016-06-08 ENCOUNTER — Ambulatory Visit (INDEPENDENT_AMBULATORY_CARE_PROVIDER_SITE_OTHER): Payer: Medicare Other | Admitting: Cardiovascular Disease

## 2016-06-08 VITALS — BP 138/88 | HR 86 | Ht 67.0 in | Wt 178.0 lb

## 2016-06-08 DIAGNOSIS — I350 Nonrheumatic aortic (valve) stenosis: Secondary | ICD-10-CM

## 2016-06-08 DIAGNOSIS — I209 Angina pectoris, unspecified: Secondary | ICD-10-CM

## 2016-06-08 DIAGNOSIS — I5032 Chronic diastolic (congestive) heart failure: Secondary | ICD-10-CM | POA: Diagnosis not present

## 2016-06-08 DIAGNOSIS — I6523 Occlusion and stenosis of bilateral carotid arteries: Secondary | ICD-10-CM

## 2016-06-08 DIAGNOSIS — I82401 Acute embolism and thrombosis of unspecified deep veins of right lower extremity: Secondary | ICD-10-CM | POA: Insufficient documentation

## 2016-06-08 DIAGNOSIS — I25768 Atherosclerosis of bypass graft of coronary artery of transplanted heart with other forms of angina pectoris: Secondary | ICD-10-CM

## 2016-06-08 NOTE — Patient Instructions (Addendum)
Medication Instructions:  Continue all current medications.  Labwork: none  Testing/Procedures:  Your physician has requested that you have a carotid duplex. This test is an ultrasound of the carotid arteries in your neck. It looks at blood flow through these arteries that supply the brain with blood. Allow one hour for this exam. There are no restrictions or special instructions.  Office will contact with results via phone or letter.    Follow-Up: Your physician wants you to follow up in: 6 months.  You will receive a reminder letter in the mail one-two months in advance.  If you don't receive a letter, please call our office to schedule the follow up appointment   Any Other Special Instructions Will Be Listed Below (If Applicable).  If you need a refill on your cardiac medications before your next appointment, please call your pharmacy.  

## 2016-06-08 NOTE — Progress Notes (Signed)
SUBJECTIVE: The patient presents for follow-up of bioprosthetic aortic valve stenosis and CABG. He also has a history of right carotid endarterectomy and right peroneal vein DVT and is on apixaban.  Echo 12/31/15: Left ventricle: The cavity size was normal. Wall thickness was increased in a pattern of mild LVH. Systolic function was normal. The estimated ejection fraction was in the range of 60% to 65%. Wall motion was normal; there were no regional wall motion abnormalities. Doppler parameters are consistent with abnormal left ventricular relaxation (grade 1 diastolic dysfunction). - Aortic valve: There appears to be bioprosthetic valve stenosis with reduced leaflet excursion. Peak velocities and mean gradients are elevated for this type of bioprosthetic valve. Peak velocity (S): 338 cm/s. Mean gradient (S): 26 mm Hg. - Mitral valve: Calcified annulus. Mildly thickened, mildly calcified leaflets . There was mild regurgitation. - Left atrium: The atrium was moderately dilated. - Right ventricle: The cavity size was mildly dilated. Systolic function was moderately reduced. - Tricuspid valve: There was mild regurgitation.  He saw Dr. Clifton James on 01/02/16 who feels at some point he would be a candidate for TAVR given bioprosthetic valve failure with moderate stenosis.  He continues to exercise at a facility in [place] 3 days per week. He walks and then lifts weights for 2 hours. He denies shortness of breath, dizziness, leg swelling, and chest pain. He said he does not want to undergo any type of valve procedure.  He is due for a repeat echocardiogram on March 14 with a follow-up visit with Dr. Clifton James on March 30.     Review of Systems: As per "subjective", otherwise negative.  Allergies  Allergen Reactions  . Penicillins Itching    Has patient had a PCN reaction causing immediate rash, facial/tongue/throat swelling, SOB or lightheadedness with  hypotension: Yes Has patient had a PCN reaction causing severe rash involving mucus membranes or skin necrosis: No Has patient had a PCN reaction that required hospitalization No Has patient had a PCN reaction occurring within the last 10 years: No If all of the above answers are "NO", then may proceed with Cephalosporin use.    . Plaquenil [Hydroxychloroquine Sulfate] Itching and Other (See Comments)    redness    Current Outpatient Prescriptions  Medication Sig Dispense Refill  . aspirin EC 81 MG tablet Take 1 tablet (81 mg total) by mouth daily.    Marland Kitchen BIOTIN PO Take 1 tablet by mouth daily.    . calcium gluconate 500 MG tablet Take 1 tablet by mouth daily.    . cholecalciferol (VITAMIN D) 400 units TABS tablet Take 2,000 Units by mouth.    Everlene Balls 5 MG TABS tablet TAKE 1 TABLET BY MOUTH TWICE DAILY 60 tablet 6  . ezetimibe (ZETIA) 10 MG tablet TAKE ONE TABLET BY MOUTH NIGHTLY AT BEDTIME 30 tablet 6  . ferrous sulfate 325 (65 FE) MG tablet TAKE ONE TABLET BY MOUTH ONCE DAILY WITH BREAKFAST 30 tablet 3  . furosemide (LASIX) 20 MG tablet TAKE ONE AND ONE-HALF TABLETS BY MOUTH ONCE DAILY 45 tablet 6  . Glucosamine-Chondroit-Vit C-Mn (GLUCOSAMINE 1500 COMPLEX) CAPS Take 1 capsule by mouth daily.     . metoprolol succinate (TOPROL-XL) 25 MG 24 hr tablet TAKE 1 AND 1/2 TABLETS BY MOUTH DAILY 45 tablet 3  . METRONIDAZOLE, TOPICAL, (METROLOTION) 0.75 % LOTN Apply topically daily.    . nitroGLYCERIN (NITROSTAT) 0.4 MG SL tablet Place 1 tablet (0.4 mg total) under the tongue every 5 (five)  minutes as needed for chest pain. Up to 3 doses. If no relief after 3rd dose, proceed to the ED for an evaluation 25 tablet 3  . omeprazole (PRILOSEC) 40 MG capsule Take 40 mg by mouth daily.    . predniSONE (DELTASONE) 5 MG tablet Take 5 mg by mouth daily with breakfast.    . simvastatin (ZOCOR) 40 MG tablet Take 1 tablet (40 mg total) by mouth at bedtime. 90 tablet 3  . traMADol (ULTRAM) 50 MG tablet Take  1-2 tablets by mouth every 8 (eight) hours as needed for pain.    . vitamin B-12 (CYANOCOBALAMIN) 1000 MCG tablet Take 1,000 mcg by mouth daily.    . vitamin C (ASCORBIC ACID) 500 MG tablet Take 500 mg by mouth daily.    . Vitamins-Lipotropics (LIPO-FLAVONOID PLUS) TABS Take 2 tablets by mouth daily.    . Zinc 25 MG TABS Take 2 tablets by mouth daily.      No current facility-administered medications for this visit.     Past Medical History:  Diagnosis Date  . Anemia    AVMs 2017  . Aortic valve disease 2009   Bioprosthetic AVR at [facility]  . Coronary artery disease 2009   [facility]V CABG at [facility]  . DVT (deep venous thrombosis) (HCC) 2017  . Hyperlipemia     Past Surgical History:  Procedure Laterality Date  . APPENDECTOMY    . CARDIAC VALVE SURGERY  2009  . COLONOSCOPY  2015   Dr.Spainhour  . CORONARY ARTERY BYPASS GRAFT    . ESOPHAGOGASTRODUODENOSCOPY N/A 06/27/2015   Procedure: ESOPHAGOGASTRODUODENOSCOPY (EGD);  Surgeon: Malissa Hippo, MD;  Location: [facility];  Service: Endoscopy;  Laterality: N/A;  255  . EYE SURGERY Left   . LEG SURGERY    . TRIGGER FINGER RELEASE Left   . UPPER GASTROINTESTINAL ENDOSCOPY  2016   Dr.Spainhour    Social History   Social History  . Marital status: Married    Spouse name: N/A  . Number of children: N/A  . Years of education: N/A   Occupational History  . Not on file.   Social History Main Topics  . Smoking status: Former Smoker    Packs/day: 1.00    Years: 24.00    Types: Cigarettes    Start date: 05/17/1951    Quit date: 06/26/1975  . Smokeless tobacco: Never Used  . Alcohol use No     Comment: Patient quit 40 years ago.  . Drug use: Unknown  . Sexual activity: Not on file   Other Topics Concern  . Not on file   Social History Narrative  . No narrative on file     Vitals:   06/08/16 1350  BP: 138/88  Pulse: 86  SpO2: 93%  Weight: 178 lb (80.7 kg)  Height: 5\' 7"  (1.702 m)    PHYSICAL EXAM General appearance:  alert, cooperative and no distress Neck: no JVD Lungs: Clear, no rales. Diminished at bases. Heart: Regular rate and rhythm and 2/6 systolic murmur at RUSB Extremities: No pedal edema. Neurologic: Grossly normal Skin: Normal. Musculoskeletal: Normal range of motion.    ECG: Most recent ECG reviewed.      ASSESSMENT AND PLAN:  1. CAD s/p CABG: Stable. Continue aspirin, statin, metoprolol, and Zetia.  2. 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 Echocardiogram reviewed above. May eventually undergo TAVR once disease state progresses should he decide to proceed. Repeat echocardiogram already ordered for next week. I will review.  3. Right  leg DVT: [place] 01/28/16 showed partially occlusive thrombus in the right peroneal vein which was stable and likely chronic in nature. Continue Eliquis for now but after review of today's [place], I may discontinue it.  4. Chronic diastolic heart failure: Normal LVEF in 12/2015. Euvolemic on Lasix 30 mg daily. No changes.  5. Carotid artery stenosis s/p right CEA: Continue aspirin and statin therapy. Dopplers 06/30/15 <50% RICA and 50-69% LICA stenosis. Will repeat.  Dispo: fu 6 months.   Prentice Docker, M.D., F.A.C.C.

## 2016-06-11 ENCOUNTER — Telehealth: Payer: Self-pay | Admitting: *Deleted

## 2016-06-11 NOTE — Telephone Encounter (Signed)
Notes Recorded by Lesle Chris, LPN on 04/11/5100 at 6:07 PM EST Patient notified. Copy to pmd. ------  Notes Recorded by Lesle Chris, LPN on 08/11/5275 at 2:57 PM EST Attempted to notify patient - voice mail full. ------  Notes Recorded by Laqueta Linden, MD on 06/08/2016 at 5:05 PM EST Chronic DVT. Can stop Eliquis.

## 2016-06-16 ENCOUNTER — Other Ambulatory Visit: Payer: Medicare Other

## 2016-06-22 ENCOUNTER — Encounter: Payer: Self-pay | Admitting: Cardiovascular Disease

## 2016-06-22 ENCOUNTER — Other Ambulatory Visit: Payer: Self-pay | Admitting: Cardiovascular Disease

## 2016-06-24 ENCOUNTER — Ambulatory Visit: Payer: Medicare Other

## 2016-06-24 ENCOUNTER — Telehealth: Payer: Self-pay | Admitting: Cardiovascular Disease

## 2016-06-24 DIAGNOSIS — I6523 Occlusion and stenosis of bilateral carotid arteries: Secondary | ICD-10-CM | POA: Diagnosis not present

## 2016-06-24 DIAGNOSIS — I350 Nonrheumatic aortic (valve) stenosis: Secondary | ICD-10-CM

## 2016-06-24 LAB — ECHOCARDIOGRAM COMPLETE
AO mean calculated velocity dopler: 265 cm/s
AV Area VTI index: 0.55 cm2/m2
AV Area VTI: 1.01 cm2
AV Mean grad: 32 mmHg
AV Peak grad: 51 mmHg
AV VEL mean LVOT/AV: 0.31
AVLVOTPG: 5 mmHg
AVPKVEL: 356 cm/s
Ao pk vel: 0.32 m/s
CHL CUP RV SYS PRESS: 36 mmHg
E decel time: 256 msec
FS: 36 % (ref 28–44)
IV/PV OW: 0.94
LA diam end sys: 35 mm
LASIZE: 35 mm
LAVOLIN: 23.3 mL/m2
LDCA: 3.14 cm2
LV PW d: 12.5 mm — AB (ref 0.6–1.1)
LVOT VTI: 24.9 cm
LVOT peak VTI: 0.35 cm
LVOT peak vel: 114 cm/s
LVOTD: 20 mm
MV Dec: 256
MV M vel: 64.36
MV pk E vel: 110 m/s
MVG: 2 mmHg
MVPG: 5 mmHg
MVPKAVEL: 150 m/s
RV LATERAL S' VELOCITY: 8.22 cm/s
TAPSE: 14 mm
VTI: 71.5 cm
Valve area index: 0.55

## 2016-06-24 NOTE — Telephone Encounter (Signed)
LM to return call.

## 2016-06-24 NOTE — Telephone Encounter (Signed)
Correct

## 2016-06-24 NOTE — Telephone Encounter (Signed)
Would like to know if he needs to go back on Eliquis

## 2016-06-24 NOTE — Telephone Encounter (Signed)
Pt aware.

## 2016-06-24 NOTE — Telephone Encounter (Signed)
Pt had carotid US today - per last results LE [place] pt was to stop Eliquis - this correct?

## 2016-06-25 LAB — VAS US CAROTID
LEFT ECA DIAS: -30 cm/s
LEFT VERTEBRAL DIAS: -9 cm/s
LICADSYS: -192 cm/s
LICAPDIAS: -22 cm/s
Left CCA dist dias: -15 cm/s
Left CCA dist sys: -80 cm/s
Left CCA prox dias: 13 cm/s
Left CCA prox sys: 109 cm/s
Left ICA dist dias: -24 cm/s
Left ICA prox sys: -104 cm/s
RCCADSYS: -75 cm/s
RIGHT ECA DIAS: 0 cm/s
RIGHT VERTEBRAL DIAS: -22 cm/s
Right CCA prox dias: 14 cm/s
Right CCA prox sys: 86 cm/s

## 2016-06-30 ENCOUNTER — Telehealth: Payer: Self-pay | Admitting: *Deleted

## 2016-06-30 NOTE — Telephone Encounter (Signed)
Notes recorded by Lesle Chris, LPN on 9/45/0388 at 3:59 PM EDT Patient notified. Copy to pmd. ------  Notes recorded by Laqueta Linden, MD on 06/28/2016 at 8:30 AM EDT Moderate left sided blockage. Repeat in 1 year.

## 2016-07-02 ENCOUNTER — Ambulatory Visit (INDEPENDENT_AMBULATORY_CARE_PROVIDER_SITE_OTHER): Payer: Medicare Other | Admitting: Cardiovascular Disease

## 2016-07-02 ENCOUNTER — Encounter: Payer: Self-pay | Admitting: Cardiovascular Disease

## 2016-07-02 VITALS — BP 122/68 | HR 88 | Ht 67.0 in | Wt 175.4 lb

## 2016-07-02 DIAGNOSIS — I350 Nonrheumatic aortic (valve) stenosis: Secondary | ICD-10-CM | POA: Diagnosis not present

## 2016-07-02 DIAGNOSIS — I6523 Occlusion and stenosis of bilateral carotid arteries: Secondary | ICD-10-CM

## 2016-07-02 NOTE — Progress Notes (Signed)
TAVR Clinic Note  Referring Provider: Dr. Darl Householder  Chief Complaint  Patient presents with  . Follow-up     History of Present Illness: 83 yo male with history of CAD s/p CABG in 2009, aortic valve disease s/p AVR in 2009 with porcine bioprosthetic aortic valve, HLD, DVT on Eliquis, carotid artery disease who is here today for follow up in the valve clinic. I saw him in May 2017 in the valve clinic to discuss the stenosis of his bioprosthetic aortic valve. He was asymptomatic at that time and we elected to follow his AS with serial echocardiograms and examinations. He is followed in our practice by Dr. Darl Householder. He underwent 4V CABG in 2009 at [facility] along with AVR with a porcine bioprosthetic valve secondary to severe aortic valve disease. He has done well since then from a cardiac standpoint. He has known carotid artery disease and has undergone right CEA. Admitted to [facility] March 2017 with syncope and found to be anemic with GI bleeding, AVMs on colonoscopy. He was seen by cardiology in the hospital. Plavix stopped. Lasix for mild volume overload. Also diagnosed with DVT right leg and put on Eliquis shortly after that. Echo 07/01/15 with LVEF=40-45%, elevated gradients across the bioprosthetic AVR with mean gradient 34 mmHg. He has had no further syncope since his Hgb has been stable. Repeat echo 12/31/15 with LVEF=60-65% with stenosis of the bioprosthetic aortic valve. Mean gradient 26 mm Hgacross the aortic valve replacement with peak gradient 46 mm Hg. We chose to follow his valve disease until he became more symptomatic. Repeat echo 06/24/16 with normal LV function. The mean gradient across his bioprosthetic valve is now 32 mmHg.   He is here today for follow up. He has no chest pain, dyspnea or dizziness. He is exercising 3 days per week. He has no desire to proceed with TAVR right now since he is feeling well.   Primary Care Physician: ADDIS,DANIEL, DO Primary Cardiologist: Dr.  Darl Householder   Past Medical History:  Diagnosis Date  . Anemia    AVMs 2017  . Aortic valve disease 2009   Bioprosthetic AVR at [facility]  . Coronary artery disease 2009   4V CABG at [facility]  . DVT (deep venous thrombosis) (HCC) 2017  . Hyperlipemia     Past Surgical History:  Procedure Laterality Date  . APPENDECTOMY    . CARDIAC VALVE SURGERY  2009  . COLONOSCOPY  2015   Dr.Spainhour  . CORONARY ARTERY BYPASS GRAFT    . ESOPHAGOGASTRODUODENOSCOPY N/A 06/27/2015   Procedure: ESOPHAGOGASTRODUODENOSCOPY (EGD);  Surgeon: Malissa Hippo, MD;  Location[facility];  Service: Endoscopy;  Laterality: N/A;  255  . EYE SURGERY Left   . LEG SURGERY    . TRIGGER FINGER RELEASE Left   . UPPER GASTROINTESTINAL ENDOSCOPY  2016   Dr.Spainhour    Current Outpatient Prescriptions  Medication Sig Dispense Refill  . aspirin EC 81 MG tablet Take 1 tablet (81 mg total) by mouth daily.    Marland Kitchen BIOTIN PO Take 1 tablet by mouth daily.    . calcium gluconate 500 MG tablet Take 1 tablet by mouth daily.    . cholecalciferol (VITAMIN D) 400 units TABS tablet Take 2,000 Units by mouth.    . ezetimibe (ZETIA) 10 MG tablet TAKE ONE TABLET BY MOUTH NIGHTLY AT BEDTIME 30 tablet 6  . ferrous sulfate 325 (65 FE) MG tablet TAKE ONE TABLET BY MOUTH ONCE DAILY WITH BREAKFAST 30 tablet 3  .  furosemide (LASIX) 20 MG tablet TAKE ONE AND ONE-HALF TABLETS BY MOUTH ONCE DAILY 45 tablet 6  . Glucosamine-Chondroit-Vit C-Mn (GLUCOSAMINE 1500 COMPLEX) CAPS Take 1 capsule by mouth daily.     . metoprolol succinate (TOPROL-XL) 25 MG 24 hr tablet TAKE 1 AND 1/2 TABLETS BY MOUTH DAILY 45 tablet 3  . METRONIDAZOLE, TOPICAL, (METROLOTION) 0.75 % LOTN Apply topically daily.    . nitroGLYCERIN (NITROSTAT) 0.4 MG SL tablet Place 1 tablet (0.4 mg total) under the tongue every 5 (five) minutes as needed for chest pain. Up to 3 doses. If no relief after 3rd dose, proceed to the ED for an evaluation 25 tablet 3  . omeprazole (PRILOSEC) 40 MG  capsule Take 40 mg by mouth daily.    . predniSONE (DELTASONE) 5 MG tablet Take 5 mg by mouth daily with breakfast.    . simvastatin (ZOCOR) 40 MG tablet Take 1 tablet (40 mg total) by mouth at bedtime. 90 tablet 3  . traMADol (ULTRAM) 50 MG tablet Take 1-2 tablets by mouth every 8 (eight) hours as needed for pain.    . vitamin B-12 (CYANOCOBALAMIN) 1000 MCG tablet Take 1,000 mcg by mouth daily.    . vitamin C (ASCORBIC ACID) 500 MG tablet Take 500 mg by mouth daily.    . Vitamins-Lipotropics (LIPO-FLAVONOID PLUS) TABS Take 2 tablets by mouth daily.    . Zinc 25 MG TABS Take 2 tablets by mouth daily.      No current facility-administered medications for this visit.     Allergies  Allergen Reactions  . Penicillins Itching    Has patient had a PCN reaction causing immediate rash, facial/tongue/throat swelling, SOB or lightheadedness with hypotension: Yes Has patient had a PCN reaction causing severe rash involving mucus membranes or skin necrosis: No Has patient had a PCN reaction that required hospitalization No Has patient had a PCN reaction occurring within the last 10 years: No If all of the above answers are "NO", then may proceed with Cephalosporin use.    . Plaquenil [Hydroxychloroquine Sulfate] Itching and Other (See Comments)    redness    Social History   Social History  . Marital status: Married    Spouse name: N/A  . Number of children: N/A  . Years of education: N/A   Occupational History  . Not on file.   Social History Main Topics  . Smoking status: Former Smoker    Packs/day: 1.00    Years: 24.00    Types: Cigarettes    Start date: 05/17/1951    Quit date: 06/26/1975  . Smokeless tobacco: Never Used  . Alcohol use No     Comment: Patient quit 40 years ago.  . Drug use: Unknown  . Sexual activity: Not on file   Other Topics Concern  . Not on file   Social History Narrative  . No narrative on file    Family History  Problem Relation Age of Onset  .  Dementia Mother   . Heart disease Father   . Healthy Daughter   . Bipolar disorder Son     Review of Systems:  As stated in the HPI and otherwise negative.   BP 122/68   Pulse 88   Ht 5\' 7"  (1.702 m)   Wt 175 lb 6.4 oz (79.6 kg)   SpO2 93%   BMI 27.47 kg/m   Physical Examination: General: Well developed, well nourished, NAD  HEENT: OP clear, mucus membranes moist  SKIN: warm, dry. No rashes.  Neuro: No focal deficits  Musculoskeletal: Muscle strength 5/5 all ext  Psychiatric: Mood and affect normal  Neck: No JVD, no carotid bruits, no thyromegaly, no lymphadenopathy.  Lungs:Clear bilaterally, no wheezes, rhonci, crackles Cardiovascular: Regular rate and rhythm. Loud systolic murmur. No gallops or rubs. Abdomen:Soft. Bowel sounds present. Non-tender.  Extremities: No lower extremity edema. Pulses are 2 + in the bilateral DP/PT.  Echo 06/24/16: see scanned report from [facility] heartcare office. LV function normal. Stenosis of aortic replacement, mean gradient 32 mmHg.   EKG:  EKG is ordered today. This shows Sinus rhythm with HR 88 bpm. PACs. RBBB.    Recent Labs: 07/05/2015: Magnesium 1.9 08/18/2015: BUN 25; Creatinine, Ser 1.19; Potassium 4.0; Sodium 134 11/10/2015: Hemoglobin 13.6; Platelets 245   Lipid Panel No results found for: CHOL, TRIG, HDL, CHOLHDL, VLDL, LDLCALC, LDLDIRECT   Wt Readings from Last 3 Encounters:  07/02/16 175 lb 6.4 oz (79.6 kg)  06/08/16 178 lb (80.7 kg)  01/02/16 167 lb 6.4 oz (75.9 kg)     Other studies Reviewed: Additional studies/ records that were reviewed today include: . Review of the above records demonstrates:    Assessment and Plan:   1. Aortic stenosis, bioprosthetic aortic valve: He underwent AVR with bioprosthetic valve at [facility] in 2009. He now has evidence of valve failure with at least moderate stenosis of the bioprosthetic valve. This has not changed from echo in September 2017. I have personally reviewed his echo  images and the valve leaflets are thickened and do not move well. He remains asymptomatic at this time. I have discussed the nature of his aortic disease with him, his wife and his daughter today. I do not think he is at the point of considering redo AVR, but when he becomes symptomatic we will certainly be able to address this. I would likely not recommend redo open surgical AVR. He should be a reasonable candidate for TAVR. I have reviewed the TAVR procedure in detail today with the patient and his family and have provided literature related to this procedure. I will see him back in 6 months with repeat echo before that visit in 6 months.   Current medicines are reviewed at length with the patient today.  The patient does not have concerns regarding medicines.  The following changes have been made:  no change  Labs/ tests ordered today include:   Orders Placed This Encounter  Procedures  . EKG 12-Lead  . ECHOCARDIOGRAM COMPLETE    Disposition:   FU with me in 6 months after his echo.   Signed, Verne Carrow, MD 07/02/2016 3:12 PM    [facility] [address] [place], [place], [place]  [address] Phone: [phone]; Fax: [phone]

## 2016-07-02 NOTE — Patient Instructions (Signed)
Medication Instructions:  Your physician recommends that you continue on your current medications as directed. Please refer to the Current Medication list given to you today.   Labwork: none  Testing/Procedures: Your physician has requested that you have an echocardiogram. Echocardiography is a painless test that uses sound waves to create images of your heart. It provides your doctor with information about the size and shape of your heart and how well your heart's chambers and valves are working. This procedure takes approximately one hour. There are no restrictions for this procedure. To be done in 6 months in the [place] office.   Follow-Up: Your physician recommends that you schedule a follow-up appointment in: 6 months week or 2 after the echocardiogram.  Please call our office in about 2 months to schedule this appointment.     Any Other Special Instructions Will Be Listed Below (If Applicable).     If you need a refill on your cardiac medications before your next appointment, please call your pharmacy.

## 2016-08-09 ENCOUNTER — Other Ambulatory Visit: Payer: Self-pay

## 2016-08-09 MED ORDER — FUROSEMIDE 20 MG PO TABS: 30.0000 mg | ORAL_TABLET | Freq: Every day | ORAL | 3 refills | Status: AC

## 2016-08-09 NOTE — Telephone Encounter (Signed)
Lasix 90 day supply per fax request from [facility]

## 2016-09-15 ENCOUNTER — Other Ambulatory Visit (INDEPENDENT_AMBULATORY_CARE_PROVIDER_SITE_OTHER): Payer: Self-pay | Admitting: Internal Medicine

## 2016-09-15 ENCOUNTER — Other Ambulatory Visit: Payer: Self-pay | Admitting: Adult Health

## 2016-09-15 ENCOUNTER — Other Ambulatory Visit: Payer: Self-pay | Admitting: Cardiovascular Disease

## 2016-09-15 DIAGNOSIS — T189XXA Foreign body of alimentary tract, part unspecified, initial encounter: Secondary | ICD-10-CM

## 2016-09-16 ENCOUNTER — Ambulatory Visit (HOSPITAL_COMMUNITY)
Admission: RE | Admit: 2016-09-16 | Discharge: 2016-09-16 | Disposition: A | Discharge status: Home / Self Care | Payer: Medicare Other | Source: Ambulatory Visit | Attending: Internal Medicine | Admitting: Internal Medicine

## 2016-09-16 DIAGNOSIS — Z0389 Encounter for observation for other suspected diseases and conditions ruled out: Secondary | ICD-10-CM | POA: Diagnosis not present

## 2016-09-16 DIAGNOSIS — T189XXA Foreign body of alimentary tract, part unspecified, initial encounter: Secondary | ICD-10-CM

## 2016-11-04 ENCOUNTER — Other Ambulatory Visit: Payer: Self-pay | Admitting: Cardiovascular Disease

## 2016-12-08 ENCOUNTER — Other Ambulatory Visit: Payer: Self-pay | Admitting: Cardiovascular Disease

## 2016-12-27 ENCOUNTER — Ambulatory Visit (INDEPENDENT_AMBULATORY_CARE_PROVIDER_SITE_OTHER): Payer: Medicare Other | Admitting: Cardiovascular Disease

## 2016-12-27 VITALS — BP 114/70 | HR 87 | Ht 67.0 in | Wt 173.8 lb

## 2016-12-27 DIAGNOSIS — I209 Angina pectoris, unspecified: Secondary | ICD-10-CM

## 2016-12-27 DIAGNOSIS — I6523 Occlusion and stenosis of bilateral carotid arteries: Secondary | ICD-10-CM

## 2016-12-27 DIAGNOSIS — I350 Nonrheumatic aortic (valve) stenosis: Secondary | ICD-10-CM

## 2016-12-27 DIAGNOSIS — I25708 Atherosclerosis of coronary artery bypass graft(s), unspecified, with other forms of angina pectoris: Secondary | ICD-10-CM | POA: Diagnosis not present

## 2016-12-27 DIAGNOSIS — I5032 Chronic diastolic (congestive) heart failure: Secondary | ICD-10-CM | POA: Diagnosis not present

## 2016-12-27 DIAGNOSIS — I82401 Acute embolism and thrombosis of unspecified deep veins of right lower extremity: Secondary | ICD-10-CM

## 2016-12-27 NOTE — Patient Instructions (Signed)
Your physician wants you to follow-up in: April 2019 with Dr Reggy Eye will receive a reminder letter in the mail two months in advance. If you don't receive a letter, please call our office to schedule the follow-up appointment.    Your physician has requested that you have a carotid duplex in March 2019. This test is an ultrasound of the carotid arteries in your neck. It looks at blood flow through these arteries that supply the brain with blood. Allow one hour for this exam. There are no restrictions or special instructions.    Your physician recommends that you continue on your current medications as directed. Please refer to the Current Medication list given to you today.     Thank you for choosing [facility] !

## 2016-12-27 NOTE — Progress Notes (Signed)
SUBJECTIVE: The patient presents for follow-up of bioprosthetic aortic valve stenosis and CABG. He also has a history of right carotid endarterectomy and right peroneal vein DVT and is on apixaban.  He also follows with Dr. Clifton James in the valve clinic. TAVR is being considered in the future.   He has a follow-up echocardiogram scheduled for the near future.  He reportedly has cellulitis of the left leg. It is bandaged. He saw his PCP last week and is seeing him again tomorrow for this.  The patient denies chest pain and shortness of breath, as well as dizziness and lightheadedness.  He exercises at [employer] once a week and lifts weights in his basement 3 days per week.   Review of Systems: As per "subjective", otherwise negative.  Allergies  Allergen Reactions  . Penicillins Itching    Has patient had a PCN reaction causing immediate rash, facial/tongue/throat swelling, SOB or lightheadedness with hypotension: Yes Has patient had a PCN reaction causing severe rash involving mucus membranes or skin necrosis: No Has patient had a PCN reaction that required hospitalization No Has patient had a PCN reaction occurring within the last 10 years: No If all of the above answers are "NO", then may proceed with Cephalosporin use.    . Plaquenil [Hydroxychloroquine Sulfate] Itching and Other (See Comments)    redness    Current Outpatient Prescriptions  Medication Sig Dispense Refill  . aspirin EC 81 MG tablet Take 1 tablet (81 mg total) by mouth daily.    . calcium gluconate 500 MG tablet Take 1 tablet by mouth daily.    . cholecalciferol (VITAMIN D) 400 units TABS tablet Take 2,000 Units by mouth.    . ezetimibe (ZETIA) 10 MG tablet TAKE ONE TABLET BY MOUTH NIGHTLY AT BEDTIME 30 tablet 1  . ferrous sulfate 325 (65 FE) MG tablet TAKE ONE TABLET BY MOUTH ONCE DAILY WITH BREAKFAST 30 tablet 3  . furosemide (LASIX) 20 MG tablet Take 1.5 tablets (30 mg total) by mouth daily. 135  tablet 3  . Glucosamine-Chondroit-Vit C-Mn (GLUCOSAMINE 1500 COMPLEX) CAPS Take 1 capsule by mouth daily.     . metoprolol succinate (TOPROL-XL) 25 MG 24 hr tablet TAKE 1 AND 1/2 TABLETS BY MOUTH DAILY 45 tablet 1  . METRONIDAZOLE, TOPICAL, (METROLOTION) 0.75 % LOTN Apply topically daily.    . nitroGLYCERIN (NITROSTAT) 0.4 MG SL tablet Place 1 tablet (0.4 mg total) under the tongue every 5 (five) minutes as needed for chest pain. Up to 3 doses. If no relief after 3rd dose, proceed to the ED for an evaluation 25 tablet 3  . omeprazole (PRILOSEC) 40 MG capsule Take 40 mg by mouth daily.    . predniSONE (DELTASONE) 5 MG tablet Take 5 mg by mouth daily with breakfast.    . simvastatin (ZOCOR) 40 MG tablet Take 1 tablet (40 mg total) by mouth at bedtime. 90 tablet 3  . sulfaSALAzine (AZULFIDINE) 500 MG tablet Take 500 mg by mouth 3 (three) times daily.    . vitamin B-12 (CYANOCOBALAMIN) 1000 MCG tablet Take 1,000 mcg by mouth daily.    . vitamin C (ASCORBIC ACID) 500 MG tablet Take 500 mg by mouth daily.    . Vitamins-Lipotropics (LIPO-FLAVONOID PLUS) TABS Take 2 tablets by mouth daily.    . Zinc 25 MG TABS Take 2 tablets by mouth daily.      No current facility-administered medications for this visit.     Past Medical History:  Diagnosis  Date  . Anemia    AVMs 2017  . Aortic valve disease 2009   Bioprosthetic AVR at [facility]  . Coronary artery disease 2009   4V CABG at [facility]  . DVT (deep venous thrombosis) (HCC) 2017  . Hyperlipemia     Past Surgical History:  Procedure Laterality Date  . APPENDECTOMY    . CARDIAC VALVE SURGERY  2009  . COLONOSCOPY  2015   Dr.Spainhour  . CORONARY ARTERY BYPASS GRAFT    . ESOPHAGOGASTRODUODENOSCOPY N/A 06/27/2015   Procedure: ESOPHAGOGASTRODUODENOSCOPY (EGD);  Surgeon: Malissa Hippo, MD;  Location: [facility]O [facility]ITE;  Service: Endoscopy;  Laterality: N/A;  255  . EYE SURGERY Left   . LEG SURGERY    . TRIGGER FINGER RELEASE Left   . UPPER GASTROINTESTINAL  ENDOSCOPY  2016   Dr.Spainhour    Social History   Social History  . Marital status: Married    Spouse name: N/A  . Number of children: N/A  . Years of education: N/A   Occupational History  . Not on file.   Social History Main Topics  . Smoking status: Former Smoker    Packs/day: 1.00    Years: 24.00    Types: Cigarettes    Start date: 05/17/1951    Quit date: 06/26/1975  . Smokeless tobacco: Never Used  . Alcohol use No     Comment: Patient quit 40 years ago.  . Drug use: Unknown  . Sexual activity: Not on file   Other Topics Concern  . Not on file   Social History Narrative  . No narrative on file     Vitals:   12/27/16 1348  BP: 114/70  Pulse: 87  SpO2: 95%  Weight: 173 lb 12.8 oz (78.8 kg)  Height: 5\' 7"  (1.702 m)    Wt Readings from Last 3 Encounters:  12/27/16 173 lb 12.8 oz (78.8 kg)  07/02/16 175 lb 6.4 oz (79.6 kg)  06/08/16 178 lb (80.7 kg)     PHYSICAL EXAM General: NAD HEENT: Normal. Neck: No JVD, no thyromegaly. Lungs: Clear to auscultation bilaterally with normal respiratory effort. CV: Nondisplaced PMI.  Regular rate and rhythm, normal S1/prosthetic S2, no S3/S4, 3/6 systolic murmur at RUSB. Left leg is bandaged.  No carotid bruits.   Abdomen: Soft, nontender, no distention.  Neurologic: Alert and oriented.  Psych: Normal affect. Skin: Normal. Musculoskeletal: No gross deformities.    ECG: Most recent ECG reviewed.   Labs: Lab Results  Component Value Date/Time   K 4.0 08/18/2015 01:59 PM   BUN 25 (H) 08/18/2015 01:59 PM   CREATININE 1.19 08/18/2015 01:59 PM   TSH 1.199 07/01/2015 03:27 AM   HGB 12.6 (L) 03/24/2016 08:32 AM     Lipids: No results found for: LDLCALC, LDLDIRECT, CHOL, TRIG, HDL     ASSESSMENT AND PLAN: 1. CAD s/p CABG: Stable. Continue aspirin, statin, metoprolol, and Zetia.  2. 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 Echocardiogram to be repeated in near future. May  eventually undergo TAVR once disease state progresses should he decide to proceed.  3. 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 Continue Eliquis for now but I may discontinue it in the future.  4. Chronic diastolic heart failure: Normal LVEF in 12/2015. Euvolemic on Lasix 30 mg daily. No changes.  5. Carotid artery stenosis s/p right CEA: Continue aspirin and statin therapy. Dopplers 06/24/16 showed 1-39 % RICA and 60-79% LICA stenosis.  Will repeat in March 2019.      Disposition: Follow up with me in April 2019.   Prentice Docker, M.D., F.A.C.C.

## 2016-12-27 NOTE — Addendum Note (Signed)
Addended by: Marlyn Corporal A on: 12/27/2016 02:26 PM   Modules accepted: Orders

## 2016-12-29 ENCOUNTER — Ambulatory Visit (INDEPENDENT_AMBULATORY_CARE_PROVIDER_SITE_OTHER): Payer: Medicare Other

## 2016-12-29 ENCOUNTER — Other Ambulatory Visit: Payer: Self-pay

## 2016-12-29 DIAGNOSIS — I350 Nonrheumatic aortic (valve) stenosis: Secondary | ICD-10-CM

## 2017-01-04 ENCOUNTER — Other Ambulatory Visit: Payer: Self-pay | Admitting: *Deleted

## 2017-01-04 MED ORDER — SIMVASTATIN 40 MG PO TABS: 40.0000 mg | ORAL_TABLET | Freq: Every day | ORAL | 1 refills | Status: DC

## 2017-01-04 MED ORDER — METOPROLOL SUCCINATE ER 25 MG PO TB24: 37.5000 mg | ORAL_TABLET | Freq: Every day | ORAL | 1 refills | Status: AC

## 2017-01-04 MED ORDER — METOPROLOL SUCCINATE ER 25 MG PO TB24: 37.5000 mg | ORAL_TABLET | Freq: Every day | ORAL | 1 refills | Status: DC

## 2017-01-04 MED ORDER — NITROGLYCERIN 0.4 MG SL SUBL: 0.4000 mg | SUBLINGUAL_TABLET | SUBLINGUAL | 3 refills | Status: AC | PRN

## 2017-01-04 MED ORDER — SIMVASTATIN 40 MG PO TABS: 40.0000 mg | ORAL_TABLET | Freq: Every day | ORAL | 1 refills | Status: AC

## 2017-01-04 MED ORDER — NITROGLYCERIN 0.4 MG SL SUBL: 0.4000 mg | SUBLINGUAL_TABLET | SUBLINGUAL | 3 refills | Status: DC | PRN

## 2017-01-17 ENCOUNTER — Encounter: Payer: Self-pay | Admitting: Cardiovascular Disease

## 2017-01-17 ENCOUNTER — Ambulatory Visit (INDEPENDENT_AMBULATORY_CARE_PROVIDER_SITE_OTHER): Payer: Medicare Other | Admitting: Cardiovascular Disease

## 2017-01-17 VITALS — BP 116/64 | HR 95 | Ht 67.0 in | Wt 176.4 lb

## 2017-01-17 DIAGNOSIS — I6523 Occlusion and stenosis of bilateral carotid arteries: Secondary | ICD-10-CM

## 2017-01-17 DIAGNOSIS — I350 Nonrheumatic aortic (valve) stenosis: Secondary | ICD-10-CM | POA: Diagnosis not present

## 2017-01-17 DIAGNOSIS — R600 Localized edema: Secondary | ICD-10-CM | POA: Diagnosis not present

## 2017-01-17 NOTE — Progress Notes (Signed)
TAVR Clinic Note  Chief Complaint  Patient presents with  . Follow-up    aortic stenosis   History of Present Illness: 83 yo male with history of CAD s/p CABG in 2009, aortic valve disease s/p AVR in 2009 with porcine bioprosthetic aortic valve, HLD, right leg DVT, carotid artery disease who is here today for follow up in the valve clinic. I saw him in May 2017 in the valve clinic to discuss the stenosis of his bioprosthetic aortic valve. He was asymptomatic at that time and we elected to follow his AS with serial echocardiograms and examinations. He is followed in our practice by Dr. Darl Householder. He underwent 4V CABG in 2009 at [facility] along with AVR with a porcine bioprosthetic valve secondary to severe aortic valve disease. He has done well since then from a cardiac standpoint. He has known carotid artery disease and has undergone right CEA. Admitted to [facility] March 2017 with syncope and found to be anemic with GI bleeding, AVMs on colonoscopy. He was seen by cardiology in the hospital. Plavix stopped. Lasix for mild volume overload. Also diagnosed with DVT right leg and put on Eliquis shortly after that. Echo 07/01/15 with LVEF=40-45%, elevated gradients across the bioprosthetic AVR with mean gradient 34 mmHg. He has had no further syncope since his Hgb has been stable. Repeat echo 12/31/15 with LVEF=60-65% with stenosis of the bioprosthetic aortic valve. Mean gradient 26 mm Hg across the aortic valve replacement with peak gradient 46 mm Hg. We chose to follow his valve disease until he became more symptomatic. Repeat echo 06/24/16 with normal LV function and mean gradient across his bioprosthetic valve was 32 mmHg. There has been some question of patient/prosthesis mismatch contributing to his elevated gradient across the AVR. Echo September 2018 with normal LVEF, bioprosthetic AVR with mean gradient of 40 mmHg and peak gradient of 61 mmHg. The valve leaflets seem to open with some restriction.  He had a DVT in his right peroneal vein in April 2017 and was on Eliquis until 6 months ago.   He is here today for follow up. He is feeling well overall. He has rare chest pains. No dyspnea. His main complaint today is swelling in his left leg. He is on Keflex per his rheumatologist for possible cellulitis of the left leg. As above, he had a DVT in the right peroneal vein in April 2017 and was treated with Eliquis until March 2018. No fever or chills. No dizziness, near syncope or syncope.    Primary Care Physician: Renaldo Harrison, DO Primary Cardiologist: Dr. Darl Householder   Past Medical History:  Diagnosis Date  . Anemia    AVMs 2017  . Aortic valve disease 2009   Bioprosthetic AVR at [facility]  . Coronary artery disease 2009   4V CABG at [facility]  . DVT (deep venous thrombosis) (HCC) 2017  . Hyperlipemia     Past Surgical History:  Procedure Laterality Date  . APPENDECTOMY    . CARDIAC VALVE SURGERY  2009  . COLONOSCOPY  2015   Dr.Spainhour  . CORONARY ARTERY BYPASS GRAFT    . ESOPHAGOGASTRODUODENOSCOPY N/A 06/27/2015   Procedure: ESOPHAGOGASTRODUODENOSCOPY (EGD);  Surgeon: Malissa Hippo, MD;  Location: [facility] ENDO [facility]ITE;  Service: Endoscopy;  Laterality: N/A;  255  . EYE SURGERY Left   . LEG SURGERY    . TRIGGER FINGER RELEASE Left   . UPPER GASTROINTESTINAL ENDOSCOPY  2016   Dr.Spainhour    Current Outpatient Prescriptions  Medication Sig Dispense  Refill  . aspirin EC 81 MG tablet Take 1 tablet (81 mg total) by mouth daily.    . calcium gluconate 500 MG tablet Take 1 tablet by mouth daily.    . cephALEXin (KEFLEX) 500 MG capsule Take 500 mg by mouth 3 (three) times daily.  0  . ezetimibe (ZETIA) 10 MG tablet TAKE ONE TABLET BY MOUTH NIGHTLY AT BEDTIME 30 tablet 1  . furosemide (LASIX) 20 MG tablet Take 1.5 tablets (30 mg total) by mouth daily. 135 tablet 3  . Glucosamine-Chondroit-Vit C-Mn (GLUCOSAMINE 1500 COMPLEX) CAPS Take 1 capsule by mouth daily.     . metoprolol succinate  (TOPROL-XL) 25 MG 24 hr tablet Take 1.5 tablets (37.5 mg total) by mouth daily. 135 tablet 1  . METRONIDAZOLE, TOPICAL, (METROLOTION) 0.75 % LOTN Apply topically daily.    . nitroGLYCERIN (NITROSTAT) 0.4 MG SL tablet Place 1 tablet (0.4 mg total) under the tongue every 5 (five) minutes as needed for chest pain. Up to 3 doses. If no relief after 3rd dose, proceed to the ED for an evaluation 25 tablet 3  . omeprazole (PRILOSEC) 40 MG capsule Take 40 mg by mouth daily.    . predniSONE (DELTASONE) 5 MG tablet Take 5 mg by mouth daily with breakfast.    . simvastatin (ZOCOR) 40 MG tablet Take 1 tablet (40 mg total) by mouth at bedtime. 90 tablet 1  . sulfaSALAzine (AZULFIDINE) 500 MG tablet Take 500 mg by mouth 3 (three) times daily.    . vitamin B-12 (CYANOCOBALAMIN) 1000 MCG tablet Take 1,000 mcg by mouth daily.    . vitamin C (ASCORBIC ACID) 500 MG tablet Take 500 mg by mouth daily.    . Vitamins-Lipotropics (LIPO-FLAVONOID PLUS) TABS Take 2 tablets by mouth daily.    . Zinc 25 MG TABS Take 2 tablets by mouth daily.      No current facility-administered medications for this visit.     Allergies  Allergen Reactions  . Penicillins Itching    Has patient had a PCN reaction causing immediate rash, facial/tongue/throat swelling, SOB or lightheadedness with hypotension: Yes Has patient had a PCN reaction causing severe rash involving mucus membranes or skin necrosis: No Has patient had a PCN reaction that required hospitalization No Has patient had a PCN reaction occurring within the last 10 years: No If all of the above answers are "NO", then may proceed with Cephalosporin use.    . Plaquenil [Hydroxychloroquine Sulfate] Itching and Other (See Comments)    redness    Social History   Social History  . Marital status: Married    Spouse name: N/A  . Number of children: N/A  . Years of education: N/A   Occupational History  . Not on file.   Social History Main Topics  . Smoking status:  Former Smoker    Packs/day: 1.00    Years: 24.00    Types: Cigarettes    Start date: 05/17/1951    Quit date: 06/26/1975  . Smokeless tobacco: Never Used  . Alcohol use No     Comment: Patient quit 40 years ago.  . Drug use: Unknown  . Sexual activity: Not on file   Other Topics Concern  . Not on file   Social History Narrative  . No narrative on file    Family History  Problem Relation Age of Onset  . Dementia Mother   . Heart disease Father   . Healthy Daughter   . Bipolar disorder Son  Review of Systems:  As stated in the HPI and otherwise negative.   BP 116/64   Pulse 95   Ht 5\' 7"  (1.702 m)   Wt 176 lb 6.4 oz (80 kg)   SpO2 94%   BMI 27.63 kg/m   Physical Examination:  General: Well developed, well nourished, NAD  HEENT: OP clear, mucus membranes moist  SKIN: warm, dry. No rashes. Neuro: No focal deficits  Musculoskeletal: Muscle strength 5/5 all ext  Psychiatric: Mood and affect normal  Neck: No JVD, no carotid bruits, no thyromegaly, no lymphadenopathy.  Lungs:Clear bilaterally, no wheezes, rhonci, crackles Cardiovascular: Regular rate and rhythm. Loud, mid peaking, harsh systolic murmur.  Abdomen:Soft. Bowel sounds present. Non-tender.  Extremities: 2+ left lower extremity edema. The lower leg is warm to touch and the skin has erythema.   Echo 12/29/16:  - Left ventricle: The cavity size was normal. Wall thickness was   increased in a pattern of mild LVH. Systolic function was normal.   The estimated ejection fraction was in the range of 60% to 65%.   Wall motion was normal; there were no regional wall motion   abnormalities. Doppler parameters are consistent with abnormal   left ventricular relaxation (grade 1 diastolic dysfunction). - Aortic valve: 23 mm CE Magna tissue valve in AV position.   Prosthetic leaflets appear thickened but not overly restricted.   There is a significant amount of annular calcification   particularly in association with  mitral annular calcification.   Increased transvalvular gradients are consistent with severe   prosthetic aortic stenosis, although I suspect that this is   multifactorial and contributed to significantly by narrowing in   the LVOT related to annular calcification. Possibility of   patient/prosthesis mismatch is also to be considered. There was   mild regurgitation. Mean gradient (S): 40 mm Hg. Peak gradient   (S): 61 mm Hg. VTI ratio of LVOT to aortic valve: 0.26. - Mitral valve: Moderately calcified annulus. There was mild   regurgitation. - Right atrium: Central venous pressure (est): 3 mm Hg. - Tricuspid valve: There was mild regurgitation. - Pulmonary arteries: PA peak pressure: 29 mm Hg (S). - Pericardium, extracardiac: There was no pericardial effusion.  Impressions:  - 23 mm CE Magna tissue valve in AV position. Prosthetic leaflets   appear thickened but not overly restricted. There is a   significant amount of annular calcification particularly in   association with mitral annular calcification. Increased   transvalvular gradients are consistent with severe prosthetic   aortic stenosis, although I suspect that this is multifactorial   and contributed to significantly by narrowing in the LVOT related   to annular calcification. Possibility of patient/prosthesis   mismatch is also to be considered.  EKG:  EKG is not ordered today  Recent Labs: 03/24/2016: Hemoglobin 12.6   Lipid Panel No results found for: CHOL, TRIG, HDL, CHOLHDL, VLDL, LDLCALC, LDLDIRECT   Wt Readings from Last 3 Encounters:  01/17/17 176 lb 6.4 oz (80 kg)  12/27/16 173 lb 12.8 oz (78.8 kg)  07/02/16 175 lb 6.4 oz (79.6 kg)     Other studies Reviewed: Additional studies/ records that were reviewed today include: . Review of the above records demonstrates:    Assessment and Plan:   1. Aortic stenosis, bioprosthetic aortic valve: He underwent AVR with bioprosthetic valve at [facility] in 2009. He  now has evidence of increased gradients across the valve replacement. There is likely some patient/prosthesis mismatch as well as some dysfunction  of the actual valve leaflets. He has no change in symptoms. He has normal LV systolic function over 2 years of serial echocardiograms. He is having progressive dementia per wife. He is very pleasant. For now, will continue surveillance. I will ask him to follow with Dr. Purvis Sheffield in 6 months and assess his clinical status. He should have a repeat echo in 1 year. I will see him back in the valve clinic if there is a chagne in clinical status, severe change in gradients or worsening LV systolic function.   2. Left lower extremity edema: This may be due to cellulitis but given h/o prior DVT in right leg, suspicion for acute left lower extremity DVT is high. Will arrange venous u/s to exclude  DVT. If he has a DVT, will need to be restarted on Eliquis. Continue Keflex.   Current medicines are reviewed at length with the patient today.  The patient does not have concerns regarding medicines.  The following changes have been made:  no change  Labs/ tests ordered today include:   No orders of the defined types were placed in this encounter.   Disposition:   FU with me in 6 months  Signed, Verne Carrow, MD 01/17/2017 3:05 PM    [facility] [address] [place], [place], [place]  [address] Phone: [phone]; Fax: [phone]

## 2017-01-17 NOTE — Patient Instructions (Addendum)
Medication Instructions:  Your physician recommends that you continue on your current medications as directed. Please refer to the Current Medication list given to you today.   Labwork: none  Testing/Procedures: Your physician has requested that you have a lower or upper extremity venous duplex. This test is an ultrasound of the veins in the legs or arms. It looks at venous blood flow that carries blood from the heart to the legs or arms. Allow one hour for a Lower Venous exam. Allow thirty minutes for an Upper Venous exam. There are no restrictions or special instructions.  Scheduled for 01/18/17 at 1:30     Follow-Up: Your physician wants you to follow-up in: 6 months with Dr. Purvis Sheffield.  You will receive a reminder letter in the mail two months in advance. If you don't receive a letter, please call our office to schedule the follow-up appointment.   Any Other Special Instructions Will Be Listed Below (If Applicable).     If you need a refill on your cardiac medications before your next appointment, please call your pharmacy.

## 2017-01-18 ENCOUNTER — Ambulatory Visit (HOSPITAL_COMMUNITY)
Admission: RE | Admit: 2017-01-18 | Discharge: 2017-01-18 | Disposition: A | Discharge status: Home / Self Care | Payer: Medicare Other | Source: Ambulatory Visit | Attending: Cardiology | Admitting: Cardiology

## 2017-01-18 ENCOUNTER — Encounter (HOSPITAL_COMMUNITY): Payer: Medicare Other

## 2017-01-18 DIAGNOSIS — R600 Localized edema: Secondary | ICD-10-CM

## 2017-02-08 ENCOUNTER — Encounter: Payer: Self-pay | Admitting: *Deleted

## 2017-02-09 ENCOUNTER — Other Ambulatory Visit: Payer: Self-pay

## 2017-02-09 ENCOUNTER — Ambulatory Visit: Payer: Medicare Other | Admitting: Cardiovascular Disease

## 2017-02-09 ENCOUNTER — Emergency Department (HOSPITAL_COMMUNITY): Payer: Medicare Other

## 2017-02-09 ENCOUNTER — Encounter (HOSPITAL_COMMUNITY): Payer: Self-pay | Admitting: Emergency Medicine

## 2017-02-09 ENCOUNTER — Telehealth: Payer: Self-pay

## 2017-02-09 ENCOUNTER — Encounter: Payer: Self-pay | Admitting: Cardiovascular Disease

## 2017-02-09 ENCOUNTER — Inpatient Hospital Stay (HOSPITAL_COMMUNITY)
Admission: EM | Admit: 2017-02-09 | Discharge: 2017-03-05 | DRG: 291 | Disposition: E | Payer: Medicare Other | Attending: Family Medicine | Admitting: Family Medicine

## 2017-02-09 DIAGNOSIS — J9601 Acute respiratory failure with hypoxia: Secondary | ICD-10-CM | POA: Diagnosis not present

## 2017-02-09 DIAGNOSIS — I350 Nonrheumatic aortic (valve) stenosis: Secondary | ICD-10-CM | POA: Diagnosis present

## 2017-02-09 DIAGNOSIS — I249 Acute ischemic heart disease, unspecified: Secondary | ICD-10-CM | POA: Diagnosis present

## 2017-02-09 DIAGNOSIS — N179 Acute kidney failure, unspecified: Secondary | ICD-10-CM | POA: Diagnosis present

## 2017-02-09 DIAGNOSIS — R079 Chest pain, unspecified: Secondary | ICD-10-CM | POA: Diagnosis not present

## 2017-02-09 DIAGNOSIS — I10 Essential (primary) hypertension: Secondary | ICD-10-CM | POA: Diagnosis not present

## 2017-02-09 DIAGNOSIS — D649 Anemia, unspecified: Secondary | ICD-10-CM | POA: Diagnosis present

## 2017-02-09 DIAGNOSIS — M069 Rheumatoid arthritis, unspecified: Secondary | ICD-10-CM | POA: Diagnosis present

## 2017-02-09 DIAGNOSIS — Z66 Do not resuscitate: Secondary | ICD-10-CM | POA: Diagnosis present

## 2017-02-09 DIAGNOSIS — I5043 Acute on chronic combined systolic (congestive) and diastolic (congestive) heart failure: Secondary | ICD-10-CM | POA: Diagnosis not present

## 2017-02-09 DIAGNOSIS — Z880 Allergy status to penicillin: Secondary | ICD-10-CM | POA: Diagnosis not present

## 2017-02-09 DIAGNOSIS — E8770 Fluid overload, unspecified: Secondary | ICD-10-CM | POA: Diagnosis present

## 2017-02-09 DIAGNOSIS — Z952 Presence of prosthetic heart valve: Secondary | ICD-10-CM | POA: Diagnosis not present

## 2017-02-09 DIAGNOSIS — Z8711 Personal history of peptic ulcer disease: Secondary | ICD-10-CM

## 2017-02-09 DIAGNOSIS — Z87891 Personal history of nicotine dependence: Secondary | ICD-10-CM | POA: Diagnosis not present

## 2017-02-09 DIAGNOSIS — Z8249 Family history of ischemic heart disease and other diseases of the circulatory system: Secondary | ICD-10-CM

## 2017-02-09 DIAGNOSIS — E785 Hyperlipidemia, unspecified: Secondary | ICD-10-CM | POA: Diagnosis present

## 2017-02-09 DIAGNOSIS — Z953 Presence of xenogenic heart valve: Secondary | ICD-10-CM | POA: Diagnosis not present

## 2017-02-09 DIAGNOSIS — Z7982 Long term (current) use of aspirin: Secondary | ICD-10-CM

## 2017-02-09 DIAGNOSIS — I110 Hypertensive heart disease with heart failure: Secondary | ICD-10-CM | POA: Diagnosis present

## 2017-02-09 DIAGNOSIS — I509 Heart failure, unspecified: Secondary | ICD-10-CM

## 2017-02-09 DIAGNOSIS — I259 Chronic ischemic heart disease, unspecified: Secondary | ICD-10-CM | POA: Diagnosis not present

## 2017-02-09 DIAGNOSIS — Z79899 Other long term (current) drug therapy: Secondary | ICD-10-CM | POA: Diagnosis not present

## 2017-02-09 DIAGNOSIS — I472 Ventricular tachycardia: Secondary | ICD-10-CM | POA: Diagnosis present

## 2017-02-09 DIAGNOSIS — I25119 Atherosclerotic heart disease of native coronary artery with unspecified angina pectoris: Secondary | ICD-10-CM | POA: Diagnosis present

## 2017-02-09 DIAGNOSIS — I200 Unstable angina: Secondary | ICD-10-CM | POA: Diagnosis not present

## 2017-02-09 DIAGNOSIS — Z951 Presence of aortocoronary bypass graft: Secondary | ICD-10-CM

## 2017-02-09 DIAGNOSIS — G309 Alzheimer's disease, unspecified: Secondary | ICD-10-CM | POA: Diagnosis present

## 2017-02-09 DIAGNOSIS — R739 Hyperglycemia, unspecified: Secondary | ICD-10-CM | POA: Diagnosis present

## 2017-02-09 DIAGNOSIS — Z86718 Personal history of other venous thrombosis and embolism: Secondary | ICD-10-CM | POA: Diagnosis not present

## 2017-02-09 DIAGNOSIS — Z515 Encounter for palliative care: Secondary | ICD-10-CM | POA: Diagnosis present

## 2017-02-09 DIAGNOSIS — F0280 Dementia in other diseases classified elsewhere without behavioral disturbance: Secondary | ICD-10-CM | POA: Diagnosis present

## 2017-02-09 HISTORY — DX: Rheumatoid arthritis, unspecified: M06.9

## 2017-02-09 HISTORY — DX: Alzheimer's disease, unspecified: G30.9

## 2017-02-09 HISTORY — DX: Arteriovenous malformation, site unspecified: Q27.30

## 2017-02-09 HISTORY — DX: Dementia in other diseases classified elsewhere, unspecified severity, without behavioral disturbance, psychotic disturbance, mood disturbance, and anxiety: F02.80

## 2017-02-09 LAB — BASIC METABOLIC PANEL WITH GFR
Anion gap: 16 — ABNORMAL HIGH (ref 5–15)
BUN: 59 mg/dL — ABNORMAL HIGH (ref 6–20)
CO2: 20 mmol/L — ABNORMAL LOW (ref 22–32)
Calcium: 9.1 mg/dL (ref 8.9–10.3)
Chloride: 101 mmol/L (ref 101–111)
Creatinine, Ser: 2.08 mg/dL — ABNORMAL HIGH (ref 0.61–1.24)
GFR calc Af Amer: 32 mL/min — ABNORMAL LOW
GFR calc non Af Amer: 28 mL/min — ABNORMAL LOW
Glucose, Bld: 136 mg/dL — ABNORMAL HIGH (ref 65–99)
Potassium: 4.6 mmol/L (ref 3.5–5.1)
Sodium: 137 mmol/L (ref 135–145)

## 2017-02-09 LAB — CBC WITH DIFFERENTIAL/PLATELET
Basophils Absolute: 0 10*3/uL (ref 0.0–0.1)
Basophils Relative: 0 %
Eosinophils Absolute: 0 10*3/uL (ref 0.0–0.7)
Eosinophils Relative: 0 %
HCT: 27.5 % — ABNORMAL LOW (ref 39.0–52.0)
Hemoglobin: 8.6 g/dL — ABNORMAL LOW (ref 13.0–17.0)
Lymphocytes Relative: 8 %
Lymphs Abs: 0.6 10*3/uL — ABNORMAL LOW (ref 0.7–4.0)
MCH: 27 pg (ref 26.0–34.0)
MCHC: 31.3 g/dL (ref 30.0–36.0)
MCV: 86.2 fL (ref 78.0–100.0)
Monocytes Absolute: 1.4 10*3/uL — ABNORMAL HIGH (ref 0.1–1.0)
Monocytes Relative: 17 %
Neutro Abs: 6.3 10*3/uL (ref 1.7–7.7)
Neutrophils Relative %: 75 %
Platelets: 298 10*3/uL (ref 150–400)
RBC: 3.19 MIL/uL — ABNORMAL LOW (ref 4.22–5.81)
RDW: 15.2 % (ref 11.5–15.5)
WBC: 8.4 10*3/uL (ref 4.0–10.5)

## 2017-02-09 LAB — BRAIN NATRIURETIC PEPTIDE: B Natriuretic Peptide: 2090 pg/mL — ABNORMAL HIGH (ref 0.0–100.0)

## 2017-02-09 LAB — TROPONIN I: Troponin I: 0.47 ng/mL

## 2017-02-09 LAB — PROTIME-INR
INR: 1.32
Prothrombin Time: 16.3 seconds — ABNORMAL HIGH (ref 11.4–15.2)

## 2017-02-09 MED ORDER — FENTANYL CITRATE (PF) 100 MCG/2ML IJ SOLN
50.0000 ug | Freq: Once | INTRAMUSCULAR | Status: AC
  Administered 2017-02-09: 50 ug via INTRAVENOUS
  Filled 2017-02-09: qty 2

## 2017-02-09 MED ORDER — NITROGLYCERIN 0.4 MG SL SUBL
0.4000 mg | SUBLINGUAL_TABLET | SUBLINGUAL | Status: DC | PRN
  Administered 2017-02-10: 02:00:00 0.4 mg via SUBLINGUAL
  Filled 2017-02-09 (×2): qty 1

## 2017-02-09 MED ORDER — HEPARIN BOLUS VIA INFUSION
4000.0000 [IU] | Freq: Once | INTRAVENOUS | Status: AC
  Administered 2017-02-09: 4000 [IU] via INTRAVENOUS

## 2017-02-09 MED ORDER — FUROSEMIDE 10 MG/ML IJ SOLN
40.0000 mg | Freq: Once | INTRAMUSCULAR | Status: AC
  Administered 2017-02-09: 40 mg via INTRAVENOUS
  Filled 2017-02-09: qty 4

## 2017-02-09 MED ORDER — HEPARIN (PORCINE) IN NACL 100-0.45 UNIT/ML-% IJ SOLN
1300.0000 [IU]/h | INTRAMUSCULAR | Status: DC
Start: 2017-02-09 — End: 2017-02-11
  Administered 2017-02-09: 1000 [IU]/h via INTRAVENOUS
  Administered 2017-02-10: 13:00:00 1150 [IU]/h via INTRAVENOUS
  Filled 2017-02-09 (×3): qty 250

## 2017-02-09 NOTE — Progress Notes (Signed)
ANTICOAGULATION CONSULT NOTE - Initial Consult  Pharmacy Consult for Heparin Indication: chest pain/ACS  Allergies  Allergen Reactions  . Penicillins Itching    Has patient had a PCN reaction causing immediate rash, facial/tongue/throat swelling, SOB or lightheadedness with hypotension: Yes Has patient had a PCN reaction causing severe rash involving mucus membranes or skin necrosis: No Has patient had a PCN reaction that required hospitalization No Has patient had a PCN reaction occurring within the last 10 years: No If all of the above answers are "NO", then may proceed with Cephalosporin use.    . Plaquenil [Hydroxychloroquine Sulfate] Itching and Other (See Comments)    redness    Patient Measurements: Weight: 176 lb (79.8 kg)   HEPARIN DW (KG): 79.8   Vital Signs: Temp: 98.3 F (36.8 C) (11/07 1906) Temp Source: Oral (11/07 1906) BP: 98/74 (11/07 2110) Pulse Rate: 107 (11/07 2110)  Labs: Recent Labs    02/23/2017 1625 02/16/2017 1752  HGB  --  8.6*  HCT  --  27.5*  PLT  --  298  CREATININE 2.08*  --   TROPONINI 0.47*  --     Estimated Creatinine Clearance: 27.3 mL/min (A) (by C-G formula based on SCr of 2.08 mg/dL (H)).   Medical History: Past Medical History:  Diagnosis Date  . Anemia    AVMs 2017  . Aortic valve disease 2009   Bioprosthetic AVR at [facility]  . Coronary artery disease 2009   4V CABG at [facility]  . Dementia   . DVT (deep venous thrombosis) (HCC) 2017  . Hyperlipemia   . Rheumatoid arthritis (HCC)     Medications:  See med rec  Assessment: 83 y.o. male with medical history significant of CAD s/p 4v CABG in 2009 as well as aortic valve replacement(porcine); RA; anemia; and HLD presenting with chest pain. Initial cardiac troponin elevated. Plan to start Heparin drip based on positive troponin and extremely high risk for ACS  Goal of Therapy:  Heparin level 0.3-0.7 units/ml Monitor platelets by anticoagulation protocol: Yes   Plan:  Give  4000 units bolus x 1 Start heparin infusion at 1000 units/hr Check anti-Xa level in 8 hours and daily while on heparin Continue to monitor H&H and platelets  Elder Cyphers, BS Loura Back, BCPS Clinical Pharmacist Pager [phone] 02/27/2017,9:58 PM

## 2017-02-09 NOTE — ED Notes (Signed)
Waiting for [facility] for transport. 

## 2017-02-09 NOTE — Telephone Encounter (Signed)
Patient came into office today at 3:10 for a 1:20 appointment. Patients wife stated patient has been having chest pain everyday since he came home from the hospital. Wife stated on the way over patient stated he was having bad episodes of chest pain. Patient currently having chest pain. Wife states patient has been very forgetful as well over the past few weeks. Advised patient it was best for him to go to the ER to be evaluated. Wife stated they would take patient to [facility] for further evaluation. Dr. Purvis Sheffield made aware.

## 2017-02-09 NOTE — ED Triage Notes (Signed)
Pt has dementia. Wife with pt and states they left ama at [facility] Friday due to heart problems. Pt has been c/o left cp x 1 month. Pt aloert/oriented to most. Nad. Nondiaphoretic. Denies n/v/d

## 2017-02-09 NOTE — ED Notes (Signed)
Pt called out c/o chest pain 5/10; ekg performed and given to Dr. Clayborne Dana and order for fentnyl given; pt assisted to bedside for use of urinal

## 2017-02-09 NOTE — ED Notes (Signed)
CRITICAL VALUE ALERT  Critical Value:  Troponin 0.47  Date & Time Notied:  02/22/2017 1743  Provider Notified: Dr. Clayborne Dana  Orders Received/Actions taken: EDP notified, no further orders given

## 2017-02-09 NOTE — ED Notes (Signed)
Pt states his chest is hurting. Wife has patient's at home Nitroglycerin SL tablets and gave patient 1.

## 2017-02-09 NOTE — H&P (Addendum)
History and Physical    Leon Freeman MCN:[id number] DOB: 07-01-1931 DOA: March 07, 2017  PCP: Renaldo Harrison, DO Consultants:  Purvis Sheffield - cardiology Patient coming from:  Home - lives with wife; NOK: wife, [phone]  Chief Complaint: chest pain  HPI: Leon Freeman is a 83 y.o. male with medical history significant of CAD s/p 4v CABG in 2009 as well as aortic valve replacement; RA; anemia; and HLD presenting with chest pain.  The patient has at least mild dementia (but was oriented x 3 during my evaluation) and was unaccompanied at the time of my evaluation.  His wife reported the history to the ER doctor and also had records from his recent hospitalization in [place].  He reports that he was having some heart pains.  It has been goping on for a while but has become more constant now.  Left-sided chest pain.  It is there all the time.  He was taking some heart medicines and they kind of eased it off but it is still there.  It is worse with moving around.  No SOB.  +LE edema.  History according to Dr. Clayborne Dana:  He was in the hospital and [facility] Thursday and Friday secondary to similar symptoms however he started feeling better so he left Friday afternoon and was told to follow with Dr. Purvis Sheffield yesterday however there was some type of misunderstanding of his appointment so he did not make it. He has had 2 or 3 days of worsening pain and dyspnea on exertion. Also worsening lower extremity edema. States he does not really have pain at rest is actually made better with rest. Takes 30 mg of Lasix daily and has not had any increases in dose recently. I review his records from the other hospital look like his troponin was a little bit elevated at 0.8 he was on heparin and was pending a stress test however never got it. Also an ultrasound of his heart which showed a EF of 25-30%. He was slightly anemic but never received a blood transfusion.   ED Course: Concern for CHF exacerbation.  Troponin slightly  elevated, likely demand related to CHF.  Given 40 mg IV Lasix.  Cardiologist paged.  Review of Systems: As per HPI; otherwise review of systems reviewed and negative.  This may not be wholly accurate given the patient's reported h/o dementia.  Ambulatory Status:  Ambulates without assistance  Past Medical History:  Diagnosis Date  . Anemia    AVMs 2017  . Aortic valve disease 2009   Bioprosthetic AVR at [facility]  . Coronary artery disease 2009   4V CABG at [facility]  . Dementia   . DVT (deep venous thrombosis) (HCC) 2017  . Hyperlipemia   . Rheumatoid arthritis [facility])     Past Surgical History:  Procedure Laterality Date  . APPENDECTOMY    . CARDIAC VALVE SURGERY  2009  . COLONOSCOPY  2015   Dr.Spainhour  . CORONARY ARTERY BYPASS GRAFT    . EYE SURGERY Left   . LEG SURGERY    . TRIGGER FINGER RELEASE Left   . UPPER GASTROINTESTINAL ENDOSCOPY  2016   Dr.Spainhour    Social History   Socioeconomic History  . Marital status: Married    Spouse name: Not on file  . Number of children: Not on file  . Years of education: Not on file  . Highest education level: Not on file  Social Needs  . Financial resource strain: Not on file  . Food insecurity - worry: Not  on file  . Food insecurity - inability: Not on file  . Transportation needs - medical: Not on file  . Transportation needs - non-medical: Not on file  Occupational History  . Occupation: retired  Tobacco Use  . Smoking status: Former Smoker    Packs/day: 1.00    Years: 24.00    Pack years: 24.00    Types: Cigarettes    Start date: 05/17/1951    Last attempt to quit: 06/26/1975    Years since quitting: 41.6  . Smokeless tobacco: Never Used  Substance and Sexual Activity  . Alcohol use: No    Alcohol/week: 0.0 oz    Comment: Patient quit 40 years ago.  . Drug use: Not on file  . Sexual activity: Not on file  Other Topics Concern  . Not on file  Social History Narrative  . Not on file    Allergies  Allergen  Reactions  . Penicillins Itching    Has patient had a PCN reaction causing immediate rash, facial/tongue/throat swelling, SOB or lightheadedness with hypotension: Yes Has patient had a PCN reaction causing severe rash involving mucus membranes or skin necrosis: No Has patient had a PCN reaction that required hospitalization No Has patient had a PCN reaction occurring within the last 10 years: No If all of the above answers are "NO", then may proceed with Cephalosporin use.    . Plaquenil [Hydroxychloroquine Sulfate] Itching and Other (See Comments)    redness    Family History  Problem Relation Age of Onset  . Dementia Mother   . Heart disease Father   . Healthy Daughter   . Bipolar disorder Son     Prior to Admission medications   Medication Sig Start Date End Date Taking? Authorizing Provider  aspirin EC 81 MG tablet Take 1 tablet (81 mg total) by mouth daily. 07/01/15  Yes Rehman, Joline Maxcy, MD  calcium gluconate 500 MG tablet Take 1 tablet by mouth daily.   Yes [provider]  donepezil (ARICEPT) 10 MG tablet Take 10 mg at bedtime by mouth.   Yes [provider]  ezetimibe (ZETIA) 10 MG tablet TAKE ONE TABLET BY MOUTH NIGHTLY AT BEDTIME 12/08/16  Yes Laqueta Linden, MD  furosemide (LASIX) 20 MG tablet Take 1.5 tablets (30 mg total) by mouth daily. 08/09/16  Yes Laqueta Linden, MD  Glucosamine-Chondroit-Vit C-Mn (GLUCOSAMINE 1500 COMPLEX) CAPS Take 1 capsule by mouth daily.    Yes [provider]  metoprolol succinate (TOPROL-XL) 25 MG 24 hr tablet Take 1.5 tablets (37.5 mg total) by mouth daily. 01/04/17  Yes Laqueta Linden, MD  METRONIDAZOLE, TOPICAL, (METROLOTION) 0.75 % LOTN Apply at bedtime topically.    Yes [provider]  nitroGLYCERIN (NITROSTAT) 0.4 MG SL tablet Place 1 tablet (0.4 mg total) under the tongue every 5 (five) minutes as needed for chest pain. Up to 3 doses. If no relief after 3rd dose, proceed to the ED for an  evaluation 01/04/17  Yes Laqueta Linden, MD  omeprazole (PRILOSEC) 40 MG capsule Take 40 mg by mouth daily.   Yes [provider]  simvastatin (ZOCOR) 40 MG tablet Take 1 tablet (40 mg total) by mouth at bedtime. 01/04/17  Yes Laqueta Linden, MD  sulfaSALAzine (AZULFIDINE) 500 MG tablet Take 500 mg by mouth 3 (three) times daily.   Yes [provider]  vitamin B-12 (CYANOCOBALAMIN) 1000 MCG tablet Take 1,000 mcg by mouth daily.   Yes [provider]  vitamin  C (ASCORBIC ACID) 500 MG tablet Take 500 mg by mouth daily.   Yes [provider]  Vitamins-Lipotropics (LIPO-FLAVONOID PLUS) TABS Take 2 tablets daily by mouth.   Yes [provider]  Zinc 25 MG TABS Take 2 tablets by mouth daily.    Yes [provider]    Physical Exam: Vitals:   02/04/2017 1930 02/24/2017 2000 02/24/2017 2030 02/21/2017 2110  BP: 101/72 100/69 110/83 98/74  Pulse: (!) 109 (!) 108 (!) 106 (!) 107  Resp: (!) 24 (!) 25 (!) 22 20  Temp:      TempSrc:      SpO2: 95% 94% 91% 92%     General: Appears calm and comfortable and is NAD Eyes:  PERRL, EOMI, normal lids, iris ENT: mildly diminished hearing, normal lips & tongue, mmm Neck:  no LAD, masses or thyromegaly; no carotid bruits Cardiovascular:  RRR, loud 4-5/6 systolic murmur with a click. 2-3+ taut LE edema extending up to the thighs.  Respiratory:  CTA bilaterally with no wheezes/rales/rhonchi.  Normal respiratory effort. Abdomen:  soft, NT, ND, NABS Back:   normal alignment, no CVAT Skin: no rash or induration seen on limited exam Musculoskeletal:  grossly normal tone BUE/BLE, good ROM, no bony abnormality Psychiatric:  grossly normal mood and affect, speech fluent and appropriate, AOx3; he did appear forgetful at times but overall was able to answer questions appropriately.  He knew his name; that he was in a [facility]; and that it is 2018. Neurologic: CN 2-12 grossly intact, moves all  extremities in coordinated fashion, sensation intact    Radiological Exams on Admission: Dg Chest 2 View  Result Date: 02/06/2017 CLINICAL DATA:  Initial evaluation for acute left-sided chest pain. EXAM: CHEST  2 VIEW COMPARISON:  A prior radiograph from 07/06/2015. FINDINGS: Median sternotomy wires underlying surgical clips and CABG markers. Prostatic valve. Moderate cardiomegaly, stable. Mediastinal silhouette within normal limits. Aortic atherosclerosis. Lungs normally inflated. Diffuse vascular and interstitial congestion. Small layering bilateral pleural effusions, right slightly larger than left. Right basilar opacity favored to reflect atelectasis, although infiltrates not excluded. No other focal airspace disease. No pneumothorax. No acute osseus abnormality. IMPRESSION: 1. Cardiomegaly with mild diffuse pulmonary interstitial congestion without frank pulmonary edema. 2. Trace bilateral pleural effusions, right side larger than left. 3. Associated patchy right basilar opacity. Atelectasis is favored, although infiltrate could be considered in the correct clinical setting. Electronically Signed   By: Rise Mu M.D.   On: 02/17/2017 17:07    EKG: Independently reviewed.  Sinus tachycardia with rate 103;  RBBB and nonspecific ST changes with no evidence of acute ischemia; NSCSLT   Labs on Admission: I have personally reviewed the available labs and imaging studies at the time of the admission.  Pertinent labs:   Glucose 136 BUN 59/Creatinine 2.08/GFR 28; 25/1.19/55 in 5/17 Troponin 0.47 BNP 2090 WBC 8.4 Hgb 8.6, prior 13.6 in 8/17 Platelets 298   Assessment/Plan Principal Problem:   Chest pain Active Problems:   S/P AVR (aortic valve replacement) porcine 2009   Hypertension   Anemia   Acute on chronic combined systolic and diastolic CHF (congestive heart failure) (HCC)   Hyperglycemia   Chest pain with h/o CAD -Patient with left-sided chest pain - he reports a  possible escalating pattern of discomfort that is now present regardless of activity and improved with his "heart medicines" but this does not make it go away completely. -With uncertain history, this could be noncardiac chest pain or unstable angina.  -  CXR with evidence of mild volume overload (see below).   -Initial cardiac troponin elevated, but this is reportedly improved from his prior stay in [place].  It is not clear if the troponin trended back down and is now uptrending again or if this is continued downtrend.  -EKG not indicative of acute ischemia.   -GRACE score is 214; which predicts an in-hospital death rate of 25.4%.  -Will plan to admit in stepdown at [facility] to rule out ACS by overnight observation.  -cycle troponin q6h x 3 and repeat EKG in AM -Continue ASA 81 mg  daily -morphine given -Risk factor stratification with HgbA1c and FLP; will also check TSH  -Cardiology consultation upon arrival at [facility] - NPO for possible stress test or cardiac catheterization -Will plan to start Heparin drip based on positive troponin and extremely high risk for ACS  HTN -Takes Toprol XL monotherapy at home -Patient with borderline hypotension while in the ER -Will continue Toprol for now but may need to hold if patient develops hypotension  HLD -Change Zocor to Lipitor -Check fasting lipids in the AM  Hyperglycemia -Check A1c  CHF -Patient with normal EF on Echo in 9/18 -By Dr. Danielle Rankin review of [facility] records, EF on echo in [place] was reduced to 25-30% -With this dramatic a reduction in EF, the concern for an ischemic cause is high -No crackles on PE but he does have marked LE edema -CXR with mild diffuse interstitial congestion -With elevated BNP and abnl CXR, there appears to be at least a component of CHF contributing to his presentation -Will defer repeat echo to cardiology - since he had one within the last week at an outside hospital, it is not clear that this is necessary.   However, since the echo is so markedly different from prior, it is worth considering.   -He would benefit from addition of ACE-I if his BP would support it and his creatinine was better.  -Continue Toprol for now -Was given Lasix 40 mg x 1 in ER; no additional Lasix is ordered at this time -Repeat EKG in AM  S/p AVR -It was a porcine valve so he no longer needs anticoagulation  Anemia -Hgb goal is 10 in the setting of ACS -However, he is also volume overloaded and this may make this worse -Will plan to recheck in the AM to determine if transfusion is necessary    DVT prophylaxis: Heparin drip Code Status: DNR - confirmed with patient Family Communication: Spoke with wife by telephone to notify her of transfer Disposition Plan: To be determined Consults called: Cardiology - to see upon arrival at [facility] Admission status:  Admit - It is my clinical opinion that admission to INPATIENT is reasonable and necessary because this patient will require at least 2 midnights in the hospital to treat this condition based on the medical complexity of the problems presented.  Given the aforementioned information, the predictability of an adverse outcome is felt to be significant.     Jonah Blue MD [facility]  If note is complete, please contact covering daytime or nighttime physician. [email or web address] Password [facility]  02/13/2017, 9:26 PM

## 2017-02-09 NOTE — ED Provider Notes (Signed)
Emergency Department Provider Note   I have reviewed the triage vital signs and the nursing notes.   HISTORY  Chief Complaint Chest Pain   HPI Leon Freeman is a 83 y.o. male with a history of anemia, DVT and a aortic valve replacement in 2009 vessel CABG at [facility], CHF with a EF between 25% range who presents to the emergency department today with 2 months of worsening symptoms of chest pain and shortness of breath worse with exertion.  Is a patient Dr. Purvis Sheffield.  He was in the hospital and [facility] Thursday and Friday secondary to similar symptoms however he started feeling better so he left Friday afternoon and was told to follow with Dr. Purvis Sheffield yesterday however there was some type of misunderstanding of his appointment so he did not make it.  He has had 2 or 3 days of worsening pain and dyspnea on exertion.  Also worsening lower extremity edema.  States he does not really have pain at rest is actually made better with rest.  Takes 30 mg of Lasix daily and has not had any increases in dose recently.  I review his records from the other hospital look like his troponin was a little bit elevated at 0.8 he was on heparin and was pending a stress test however never got it.  Also an ultrasound of his heart which showed a EF of 25-30%.  He was slightly anemic but never received a blood transfusion.   Past Medical History:  Diagnosis Date  . Anemia    AVMs 2017  . Aortic valve disease 2009   Bioprosthetic AVR at [facility]  . Coronary artery disease 2009   4V CABG at [facility]  . Dementia   . DVT (deep venous thrombosis) (HCC) 2017  . Hyperlipemia   . Rheumatoid arthritis [facility])     Patient Active Problem List   Diagnosis Date Noted  . Chest pain 03/04/2017  . Acute on chronic combined systolic and diastolic CHF (congestive heart failure) (HCC) 03/02/2017  . Hyperglycemia 02/18/2017  . ACS (acute coronary syndrome) (HCC) 02/20/2017  . Acute CHF (congestive heart failure) (HCC) 07/03/2015    . Demand ischemia (HCC)   . Acute blood loss anemia   . Syncope 06/30/2015  . Anemia 06/30/2015  . History of CEA (carotid endarterectomy) right  06/30/2015  . Elevated troponin   . Fall   . Excessive sleepiness   . Rheumatoid arthritis (HCC) 06/27/2015  . 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 Hx of CABG x 4 2009 06/26/2015  . S/P AVR (aortic valve replacement) porcine 2009 06/26/2015  . Hypertension 06/26/2015  . GERD (gastroesophageal reflux disease) 06/26/2015  . History of gastric ulcer 06/26/2015  . Anemia, iron deficiency 06/26/2015  . Heme positive stool 06/26/2015    Past Surgical History:  Procedure Laterality Date  . APPENDECTOMY    . CARDIAC VALVE SURGERY  2009  . COLONOSCOPY  2015   Dr.Spainhour  . CORONARY ARTERY BYPASS GRAFT    . EYE SURGERY Left   . LEG SURGERY    . TRIGGER FINGER RELEASE Left   . UPPER GASTROINTESTINAL ENDOSCOPY  2016   Dr.Spainhour    Current Outpatient Rx  . Order #: [id number] Class: Historical Med  . Order #: [id number] Class: Historical Med  . Order #: [id number] Class: Historical Med  . Order #: [id number] Class: Normal  . Order #: [id number] Class: Normal  . Order #: [id number] Class: Historical Med  . Order #: [id number] Class: Normal  . Order #:  [id number] Class: Historical Med  . Order #: [id number] Class: Fax  . Order #: [id number] Class: Historical Med  . Order #: [id number] Class: Normal  . Order #: [id number] Class: Historical Med  . Order #: [id number] Class: Historical Med  . Order #: [id number] Class: Historical Med  . Order #: [id number] Class: Historical Med  . Order #: [id number] Class: Historical Med    Allergies Penicillins and Plaquenil [hydroxychloroquine sulfate]  Family History  Problem Relation Age of Onset  . Dementia Mother   . Heart disease Father   . Healthy Daughter   . Bipolar disorder Son     Social History Social History   Tobacco Use  . Smoking status: Former Smoker    Packs/day: 1.00    Years:  24.00    Pack years: 24.00    Types: Cigarettes    Start date: 05/17/1951    Last attempt to quit: 06/26/1975    Years since quitting: 41.6  . Smokeless tobacco: Never Used  Substance Use Topics  . Alcohol use: No    Alcohol/week: 0.0 oz    Comment: Patient quit 40 years ago.  . Drug use: Not on file    Review of Systems  All other systems negative except as documented in the HPI. All pertinent positives and negatives as reviewed in the HPI. ____________________________________________   PHYSICAL EXAM:  VITAL SIGNS: ED Triage Vitals  Enc Vitals Group     BP 02/06/2017 1619 113/68     Pulse Rate 02/12/2017 1619 (!) 103     Resp 02/22/2017 1619 19     Temp 02/16/2017 1619 98.3 F (36.8 C)     Temp Source 02/07/2017 1619 Oral     SpO2 02/05/2017 1619 94 %    Constitutional: Alert and oriented. Well appearing and in no acute distress. Eyes: Conjunctivae are normal. PERRL. EOMI. Head: Atraumatic. Nose: No congestion/rhinnorhea. Mouth/Throat: Mucous membranes are moist.  Oropharynx non-erythematous. Neck: No stridor.  No meningeal signs.   Cardiovascular: tachycardic rate, regular rhythm. Good peripheral circulation. Grossly normal heart sounds.   Respiratory: tachypneic respiratory effort with O2 low 90's.  No retractions. Lungs CTAB. Gastrointestinal: Soft and nontender. No distention.  Musculoskeletal: No lower extremity tenderness but has BLE edema to mid thigh. No gross deformities of extremities. Neurologic:  Normal speech and language. No gross focal neurologic deficits are appreciated.  Skin:  Skin is warm, dry and intact. No rash noted.   ____________________________________________   LABS (all labs ordered are listed, but only abnormal results are displayed)  Labs Reviewed  BASIC METABOLIC PANEL - Abnormal; Notable for the following components:      Result Value   CO2 20 (*)    Glucose, Bld 136 (*)    BUN 59 (*)    Creatinine, Ser 2.08 (*)    GFR calc non Af Amer 28  (*)    GFR calc Af Amer 32 (*)    Anion gap 16 (*)    All other components within normal limits  TROPONIN I - Abnormal; Notable for the following components:   Troponin I 0.47 (*)    All other components within normal limits  BRAIN NATRIURETIC PEPTIDE - Abnormal; Notable for the following components:   B Natriuretic Peptide 2,090.0 (*)    All other components within normal limits  CBC WITH DIFFERENTIAL/PLATELET - Abnormal; Notable for the following components:   RBC 3.19 (*)    Hemoglobin 8.6 (*)    HCT 27.5 (*)    Lymphs Abs 0.6 (*)    Monocytes Absolute  1.4 (*)    All other components within normal limits  PROTIME-INR - Abnormal; Notable for the following components:   Prothrombin Time 16.3 (*)    All other components within normal limits  CBC WITH DIFFERENTIAL/PLATELET  HEPARIN LEVEL (UNFRACTIONATED)  CBC   ____________________________________________  EKG   EKG Interpretation  Date/Time:  Wednesday 06-Mar-2017 16:22:24 EST Ventricular Rate:  103 PR Interval:  142 QRS Duration: 140 QT Interval:  370 QTC Calculation: 484 R Axis:   -146 Text Interpretation:  Sinus tachycardia with Fusion complexes Right bundle branch block Abnormal ECG No significant change since last tracing aside from slightly faster rate Confirmed by Marily Memos [phone]) on 03/06/2017 5:17:23 PM       ____________________________________________  RADIOLOGY  Dg Chest 2 View  Result Date: 03/06/2017 CLINICAL DATA:  Initial evaluation for acute left-sided chest pain. EXAM: CHEST  2 VIEW COMPARISON:  A prior radiograph from 07/06/2015. FINDINGS: Median sternotomy wires underlying surgical clips and CABG markers. Prostatic valve. Moderate cardiomegaly, stable. Mediastinal silhouette within normal limits. Aortic atherosclerosis. Lungs normally inflated. Diffuse vascular and interstitial congestion. Small layering bilateral pleural effusions, right slightly larger than left. Right basilar opacity  favored to reflect atelectasis, although infiltrates not excluded. No other focal airspace disease. No pneumothorax. No acute osseus abnormality. IMPRESSION: 1. Cardiomegaly with mild diffuse pulmonary interstitial congestion without frank pulmonary edema. 2. Trace bilateral pleural effusions, right side larger than left. 3. Associated patchy right basilar opacity. Atelectasis is favored, although infiltrate could be considered in the correct clinical setting. Electronically Signed   By: Rise Mu M.D.   On: 03-06-2017 17:07    ____________________________________________   PROCEDURES  Procedure(s) performed:   Procedures   ____________________________________________   INITIAL IMPRESSION / ASSESSMENT AND PLAN / ED COURSE  Pertinent labs & imaging results that were available during my care of the patient were reviewed by me and considered in my medical decision making (see chart for details).  Suspect worsening fluid overload. Will check labs, ecg, cxr. Likely will need admission.   Troponin slightly eelevated. Likely demand from [facility] CHF exacerbation. Will await bnp and diurese with likely admission.   BNP elevated. Concern for CHF. Lasix given. Will admit.   Discussed with irrigates with [facility] who notes that couple months ago the patient had a normal ejection fraction with grade 1 diastolic dysfunction and this is a market change to an EF of 20-25% at the outside facility.  She requested I speak with cardiology and I spoke with the fellow, Alinda Money, who does think there reasonable possibility the patient will need a further cardiac workup which cannot be done here at [facility] and transferred to [facility] is probably best for the patient.  Dr. Ophelia Charter updated. ____________________________________________  FINAL CLINICAL IMPRESSION(S) / ED DIAGNOSES  Final diagnoses:  Heart failure, unspecified HF chronicity, unspecified heart failure type (HCC)  Hypervolemia, unspecified  hypervolemia type     MEDICATIONS GIVEN DURING THIS VISIT:  Medications  heparin bolus via infusion 4,000 Units (4,000 Units Intravenous Bolus from Bag 03-06-17 2228)    Followed by  heparin ADULT infusion 100 units/mL (25000 units/258mL sodium chloride 0.45%) (1,000 Units/hr Intravenous New Bag/Given 03/06/2017 2230)  furosemide (LASIX) injection 40 mg (40 mg Intravenous Given 03-06-2017 2014)     NEW OUTPATIENT MEDICATIONS STARTED DURING THIS VISIT:  This SmartLink is deprecated. Use AVSMEDLIST instead to display the medication list for a patient.  Note:  This document was prepared using Conservation officer, historic buildings and may  include unintentional dictation errors.   Marily Memos, MD 02/03/2017 2255

## 2017-02-09 NOTE — ED Notes (Signed)
Pt states relief from nitro. About 3 at this time.

## 2017-02-10 ENCOUNTER — Encounter (HOSPITAL_COMMUNITY): Payer: Self-pay | Admitting: General Practice

## 2017-02-10 ENCOUNTER — Other Ambulatory Visit: Payer: Self-pay

## 2017-02-10 ENCOUNTER — Observation Stay (HOSPITAL_COMMUNITY): Payer: Medicare Other

## 2017-02-10 DIAGNOSIS — R079 Chest pain, unspecified: Secondary | ICD-10-CM

## 2017-02-10 DIAGNOSIS — I200 Unstable angina: Secondary | ICD-10-CM

## 2017-02-10 LAB — HEPARIN LEVEL (UNFRACTIONATED)
HEPARIN UNFRACTIONATED: 0.26 [IU]/mL — AB (ref 0.30–0.70)
HEPARIN UNFRACTIONATED: 0.21 [IU]/mL — AB (ref 0.30–0.70)

## 2017-02-10 LAB — MRSA PCR SCREENING: MRSA by PCR: NEGATIVE

## 2017-02-10 LAB — TSH: TSH: 3.031 u[IU]/mL (ref 0.350–4.500)

## 2017-02-10 LAB — ECHOCARDIOGRAM COMPLETE
Height: 67 in
Weight: 2857.16 oz

## 2017-02-10 MED ORDER — ACETAMINOPHEN 325 MG PO TABS
650.0000 mg | ORAL_TABLET | ORAL | Status: DC | PRN
  Administered 2017-02-11: 650 mg via ORAL
  Filled 2017-02-10: qty 2

## 2017-02-10 MED ORDER — EZETIMIBE 10 MG PO TABS
10.0000 mg | ORAL_TABLET | Freq: Every day | ORAL | Status: DC
  Administered 2017-02-10: 10 mg via ORAL
  Filled 2017-02-10 (×3): qty 1

## 2017-02-10 MED ORDER — ENOXAPARIN SODIUM 30 MG/0.3ML ~~LOC~~ SOLN: 30.0000 mg | SUBCUTANEOUS | Status: DC

## 2017-02-10 MED ORDER — FUROSEMIDE 10 MG/ML IJ SOLN
40.0000 mg | Freq: Two times a day (BID) | INTRAMUSCULAR | Status: DC
  Administered 2017-02-10 – 2017-02-11 (×3): 40 mg via INTRAVENOUS
  Filled 2017-02-10 (×3): qty 4

## 2017-02-10 MED ORDER — ATORVASTATIN CALCIUM 40 MG PO TABS
40.0000 mg | ORAL_TABLET | Freq: Every day | ORAL | Status: DC
  Administered 2017-02-10: 40 mg via ORAL
  Filled 2017-02-10 (×2): qty 1

## 2017-02-10 MED ORDER — MORPHINE SULFATE (PF) 4 MG/ML IV SOLN
1.0000 mg | INTRAVENOUS | Status: DC | PRN
  Administered 2017-02-10 – 2017-02-13 (×4): 2 mg via INTRAVENOUS
  Administered 2017-02-13: 3 mg via INTRAVENOUS
  Administered 2017-02-13: 2 mg via INTRAVENOUS
  Filled 2017-02-10 (×6): qty 1

## 2017-02-10 MED ORDER — SODIUM CHLORIDE 0.9% FLUSH: 3.0000 mL | INTRAVENOUS | Status: DC | PRN

## 2017-02-10 MED ORDER — ASPIRIN EC 81 MG PO TBEC
81.0000 mg | DELAYED_RELEASE_TABLET | Freq: Every day | ORAL | Status: DC
  Administered 2017-02-10 – 2017-02-13 (×4): 81 mg via ORAL
  Filled 2017-02-10 (×4): qty 1

## 2017-02-10 MED ORDER — SODIUM CHLORIDE 0.9% FLUSH
3.0000 mL | Freq: Two times a day (BID) | INTRAVENOUS | Status: DC
  Administered 2017-02-10 – 2017-02-13 (×7): 3 mL via INTRAVENOUS

## 2017-02-10 MED ORDER — METOPROLOL SUCCINATE ER 25 MG PO TB24
37.5000 mg | ORAL_TABLET | Freq: Every day | ORAL | Status: DC
  Filled 2017-02-10 (×2): qty 1

## 2017-02-10 MED ORDER — SULFASALAZINE 500 MG PO TABS
500.0000 mg | ORAL_TABLET | Freq: Three times a day (TID) | ORAL | Status: DC
  Administered 2017-02-10 – 2017-02-13 (×10): 500 mg via ORAL
  Filled 2017-02-10 (×17): qty 1

## 2017-02-10 MED ORDER — DONEPEZIL HCL 10 MG PO TABS
10.0000 mg | ORAL_TABLET | Freq: Every day | ORAL | Status: DC
  Administered 2017-02-10 – 2017-02-13 (×4): 10 mg via ORAL
  Filled 2017-02-10 (×7): qty 1

## 2017-02-10 MED ORDER — ONDANSETRON HCL 4 MG/2ML IJ SOLN
4.0000 mg | Freq: Four times a day (QID) | INTRAMUSCULAR | Status: DC | PRN
  Administered 2017-02-11: 4 mg via INTRAVENOUS
  Filled 2017-02-10: qty 2

## 2017-02-10 MED ORDER — ORAL CARE MOUTH RINSE
15.0000 mL | Freq: Two times a day (BID) | OROMUCOSAL | Status: DC
  Administered 2017-02-10 – 2017-02-13 (×8): 15 mL via OROMUCOSAL

## 2017-02-10 MED ORDER — SODIUM CHLORIDE 0.9 % IV SOLN
250.0000 mL | INTRAVENOUS | Status: DC | PRN
Start: 2017-02-10 — End: 2017-02-14

## 2017-02-10 MED ORDER — NITROGLYCERIN IN D5W 200-5 MCG/ML-% IV SOLN
0.0000 ug/min | INTRAVENOUS | Status: DC
  Administered 2017-02-10 (×2): 5 ug/min via INTRAVENOUS
  Filled 2017-02-10 (×2): qty 250

## 2017-02-10 MED ORDER — PANTOPRAZOLE SODIUM 40 MG PO TBEC
40.0000 mg | DELAYED_RELEASE_TABLET | Freq: Every day | ORAL | Status: DC
Start: 2017-02-10 — End: 2017-02-14
  Administered 2017-02-10 – 2017-02-13 (×4): 40 mg via ORAL
  Filled 2017-02-10 (×4): qty 1

## 2017-02-10 NOTE — Progress Notes (Signed)
ANTICOAGULATION CONSULT NOTE - Follow Up Consult  Pharmacy Consult for Heparin  Indication: chest pain/ACS  Allergies  Allergen Reactions  . Penicillins Itching    Has patient had a PCN reaction causing immediate rash, facial/tongue/throat swelling, SOB or lightheadedness with hypotension: Yes Has patient had a PCN reaction causing severe rash involving mucus membranes or skin necrosis: No Has patient had a PCN reaction that required hospitalization No Has patient had a PCN reaction occurring within the last 10 years: No If all of the above answers are "NO", then may proceed with Cephalosporin use.    . Plaquenil [Hydroxychloroquine Sulfate] Itching and Other (See Comments)    redness    Patient Measurements: Height: 5\' 7"  (170.2 cm) Weight: 178 lb 9.2 oz (81 kg) IBW/kg (Calculated) : 66.1  Vital Signs: Temp: 98 F (36.7 C) (11/08 0200) Temp Source: Oral (11/08 0200) BP: 99/57 (11/08 0600) Pulse Rate: 99 (11/08 0600)  Labs: Recent Labs    02/23/2017 1625 02/28/2017 1752 02/10/17 0537  HGB  --  8.6*  --   HCT  --  27.5*  --   PLT  --  298  --   LABPROT  --  16.3*  --   INR  --  1.32  --   HEPARINUNFRC  --   --  0.26*  CREATININE 2.08*  --   --   TROPONINI 0.47*  --   --     Estimated Creatinine Clearance: 27.4 mL/min (A) (by C-G formula based on SCr of 2.08 mg/dL (H)).   Assessment: 83 y/o M transfer from [facility] for CP, heparin level low this AM, possible cath today  Goal of Therapy:  Heparin level 0.3-0.7 units/ml Monitor platelets by anticoagulation protocol: Yes   Plan:  Inc heparin to 1150 units/hr 1500 HL  Timica Marcom 02/10/2017,7:05 AM

## 2017-02-10 NOTE — Care Management Note (Addendum)
Case Management Note  Patient Details  Name: Leon Freeman MRN: [id number] Date of Birth: [birth year]  Subjective/Objective:  From home with wife, patient with dementia,   Admitted with chest pain, has ARF, Acute HF, cre is up, cont to diurese, for echo. Per Cards note, family and patient wants no aggressive txt,they requested a palliative consult.    11/9 1524 [name] RN, BSN - NCM spoke with patient , wife and daughter at bedside, they are awaiting pt eval to see patient , if they rec [facility] , they would be interested in [facility] in [place] , and [facility] in [place] for DME,  If PT recs SNF, NCM gave them a [facility] SNF list from CSW for them to look over.  Patient is on oxygen also.  NCM gave CSW  The daughter , [name]'s cell [phone] .  Wife cell is [phone] which she states she hardly uses , but home phone is [phone].  Also Palliative is seeing patient today. Awaiting pt eval for recs.                Action/Plan: NCM will follow for dc needs.   Expected Discharge Date:                  Expected Discharge Plan:     In-House Referral:     Discharge planning Services  CM Consult  Post Acute Care Choice:    Choice offered to:     DME Arranged:    DME Agency:     HH Arranged:    HH Agency:     Status of Service:  In process, will continue to follow  If discussed at Long Length of Stay Meetings, dates discussed:    Additional Comments:  [name], RN 02/10/2017, 9:56 AM

## 2017-02-10 NOTE — Progress Notes (Signed)
ANTICOAGULATION CONSULT NOTE - Follow Up Consult  Pharmacy Consult for Heparin  Indication: chest pain/ACS  Allergies  Allergen Reactions  . Penicillins Itching    Has patient had a PCN reaction causing immediate rash, facial/tongue/throat swelling, SOB or lightheadedness with hypotension: Yes Has patient had a PCN reaction causing severe rash involving mucus membranes or skin necrosis: No Has patient had a PCN reaction that required hospitalization No Has patient had a PCN reaction occurring within the last 10 years: No If all of the above answers are "NO", then may proceed with Cephalosporin use.    . Plaquenil [Hydroxychloroquine Sulfate] Itching and Other (See Comments)    redness    Patient Measurements: Height: 5\' 7"  (170.2 cm) Weight: 178 lb 9.2 oz (81 kg) IBW/kg (Calculated) : 66.1  Vital Signs: Temp: 98.5 F (36.9 C) (11/08 1956) Temp Source: Oral (11/08 1956) BP: 114/62 (11/08 1956) Pulse Rate: 114 (11/08 1956)  Labs: Recent Labs    23-Feb-2017 1625 02/23/17 1752 02/10/17 0537 02/10/17 2050  HGB  --  8.6*  --   --   HCT  --  27.5*  --   --   PLT  --  298  --   --   LABPROT  --  16.3*  --   --   INR  --  1.32  --   --   HEPARINUNFRC  --   --  0.26* 0.21*  CREATININE 2.08*  --   --   --   TROPONINI 0.47*  --   --   --     Estimated Creatinine Clearance: 27.4 mL/min (A) (by C-G formula based on SCr of 2.08 mg/dL (H)).   Assessment: 83 y/o M transfer from [facility] for CP. Currently on IV heparin at 1150 units/hr. Patient pulled out IV line this afternoon. IV heparin resumed at 1300. No plans for cath per cards given AKI. H/H 8.6/27.5, Plt wnl.   Heparin level 0.21.   Goal of Therapy:  Heparin level 0.3-0.7 units/ml Monitor platelets by anticoagulation protocol: Yes   Plan:  1. Increase heparin infusion to 1300 units/hr  2. Repeat heparin level with am labs   03-06-1994, PharmD, BCPS 02/10/2017, 9:50 PM

## 2017-02-10 NOTE — ED Notes (Signed)
[facility] in route, report given

## 2017-02-10 NOTE — ED Provider Notes (Signed)
I was called by [facility] as patient apparently had chest pain while en route, reported at 9/10 Nurse on [facility] reports pt is awake/alert, no respiratory distress but his BP is around 110 and concerned about giving any other NTG I ordered fentanyl IVx1 verbal order Pt is scheduled to go to stepdown unit Per nurse, the STAT EKG on the [facility] truck is unchanged from prior I advised if there is any worsening while entering [place] they should stop in the ER   Zadie Rhine, MD 02/10/17 0128

## 2017-02-10 NOTE — Progress Notes (Signed)
ANTICOAGULATION CONSULT NOTE - Follow Up Consult  Pharmacy Consult for Heparin  Indication: chest pain/ACS  Allergies  Allergen Reactions  . Penicillins Itching    Has patient had a PCN reaction causing immediate rash, facial/tongue/throat swelling, SOB or lightheadedness with hypotension: Yes Has patient had a PCN reaction causing severe rash involving mucus membranes or skin necrosis: No Has patient had a PCN reaction that required hospitalization No Has patient had a PCN reaction occurring within the last 10 years: No If all of the above answers are "NO", then may proceed with Cephalosporin use.    . Plaquenil [Hydroxychloroquine Sulfate] Itching and Other (See Comments)    redness    Patient Measurements: Height: 5\' 7"  (170.2 cm) Weight: 178 lb 9.2 oz (81 kg) IBW/kg (Calculated) : 66.1  Vital Signs: Temp: 98.6 F (37 C) (11/08 1042) Temp Source: Oral (11/08 1042) BP: 104/66 (11/08 1042) Pulse Rate: 99 (11/08 0700)  Labs: Recent Labs    03-01-2017 1625 03-01-17 1752 02/10/17 0537  HGB  --  8.6*  --   HCT  --  27.5*  --   PLT  --  298  --   LABPROT  --  16.3*  --   INR  --  1.32  --   HEPARINUNFRC  --   --  0.26*  CREATININE 2.08*  --   --   TROPONINI 0.47*  --   --     Estimated Creatinine Clearance: 27.4 mL/min (A) (by C-G formula based on SCr of 2.08 mg/dL (H)).   Assessment: 83 y/o M transfer from [facility] for CP. Currently on IV heparin at 1150 units/hr. Patient pulled out IV line this afternoon. IV heparin resumed at 1300. No plans for cath per cards given AKI. H/H 8.6/27.5, Plt wnl.   Goal of Therapy:  Heparin level 0.3-0.7 units/ml Monitor platelets by anticoagulation protocol: Yes   Plan:  Continue heparin at 1150 units/hr F/u 2100 HL  [id number], PharmD., BCPS Clinical Pharmacist Pager [phone]

## 2017-02-10 NOTE — Progress Notes (Signed)
Progress Note  Patient Name: Leon Freeman Date of Encounter: 02/10/2017  Primary Cardiologist: Dr Purvis Sheffield and Dr Clifton James  Subjective   Pt with "mild" CP; no dyspnea this AM  Inpatient Medications    Scheduled Meds: . aspirin EC  81 mg Oral Daily  . atorvastatin  40 mg Oral q1800  . donepezil  10 mg Oral QHS  . ezetimibe  10 mg Oral QHS  . mouth rinse  15 mL Mouth Rinse BID  . metoprolol succinate  37.5 mg Oral Daily  . pantoprazole  40 mg Oral Daily  . sodium chloride flush  3 mL Intravenous Q12H  . sulfaSALAzine  500 mg Oral TID   Continuous Infusions: . sodium chloride    . heparin 1,000 Units/hr (02/10/17 0600)  . nitroGLYCERIN 5 mcg/min (02/10/17 0600)   PRN Meds: sodium chloride, acetaminophen, morphine injection, nitroGLYCERIN, ondansetron (ZOFRAN) IV, sodium chloride flush   Vital Signs    Vitals:   02/10/17 0400 02/10/17 0500 02/10/17 0600 02/10/17 0700  BP: 107/62 (!) 106/57 (!) 99/57 114/61  Pulse: 99 (!) 101 99 99  Resp: 18 18 15 15   Temp:    98.3 F (36.8 C)  TempSrc:    Oral  SpO2: 93% 94% 93% 93%  Weight:      Height:        Intake/Output Summary (Last 24 hours) at 02/10/2017 0811 Last data filed at 02/10/2017 0700 Gross per 24 hour  Intake 78.5 ml  Output 400 ml  Net -321.5 ml   Filed Weights   02/19/2017 2110 02/10/17 0200  Weight: 176 lb (79.8 kg) 178 lb 9.2 oz (81 kg)    Telemetry    Sinus with couplet- Personally Reviewed   Physical Exam   GEN: No acute distress.   Neck: supple Cardiac: RRR, 2/6 systolic murmur Respiratory: Clear to auscultation bilaterally. GI: Soft, nontender, non-distended  MS: 2+ edema Neuro:  Nonfocal  Psych: Normal affect   Labs    Chemistry Recent Labs  Lab 02/11/2017 1625  NA 137  K 4.6  CL 101  CO2 20*  GLUCOSE 136*  BUN 59*  CREATININE 2.08*  CALCIUM 9.1  GFRNONAA 28*  GFRAA 32*  ANIONGAP 16*     Hematology Recent Labs  Lab 02/13/2017 1752  WBC 8.4  RBC 3.19*  HGB 8.6*  HCT  27.5*  MCV 86.2  MCH 27.0  MCHC 31.3  RDW 15.2  PLT 298    Cardiac Enzymes Recent Labs  Lab 02/08/2017 1625  TROPONINI 0.47*    BNP Recent Labs  Lab 02/13/2017 1752  BNP 2,090.0*      Radiology    Dg Chest 2 View  Result Date: 02/10/2017 CLINICAL DATA:  Initial evaluation for acute left-sided chest pain. EXAM: CHEST  2 VIEW COMPARISON:  A prior radiograph from 07/06/2015. FINDINGS: Median sternotomy wires underlying surgical clips and CABG markers. Prostatic valve. Moderate cardiomegaly, stable. Mediastinal silhouette within normal limits. Aortic atherosclerosis. Lungs normally inflated. Diffuse vascular and interstitial congestion. Small layering bilateral pleural effusions, right slightly larger than left. Right basilar opacity favored to reflect atelectasis, although infiltrates not excluded. No other focal airspace disease. No pneumothorax. No acute osseus abnormality. IMPRESSION: 1. Cardiomegaly with mild diffuse pulmonary interstitial congestion without frank pulmonary edema. 2. Trace bilateral pleural effusions, right side larger than left. 3. Associated patchy right basilar opacity. Atelectasis is favored, although infiltrate could be considered in the correct clinical setting. Electronically Signed   By: 09/05/2015.D.  On: 02/11/2017 17:07    Patient Profile     83 y.o. 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 Last echocardiogram in [facility] 12/29/2016 showed normal LV function, prosthetic aortic valve with severe stenosis with mean gradient 40 mmHg (possible contribution from patient/prosthesis mismatch) and mild aortic  insufficiency mild mitral regurgitation. Recently hospitalized and [facility] for chest pain and by report echocardiogram now showed ejection fraction 25-30%. Troponin was elevated at 0.8.  Assessment & Plan    1 acute systolic congestive heart failure-patient presents with some dyspnea and chest pain. He is significantly volume overloaded on examination. Per report he has newly reduced LV function. Will discontinue beta-blockade in setting of acute CHF. Repeat echocardiogram. Diurese with Lasix 40 mg IV twice a day. Follow renal function closely.  2 history of severe aortic stenosis-may be contributing to reduced LV function. Will repeat echocardiogram. He is followed in the TAVR clinic.  3 elevated troponin-unclear if this is acute coronary syndrome which seems less likely versus elevation related to congestive heart failure/renal insufficiency. Continue aspirin and heparin for now. Continue to cycle enzymes.Given acute renal failure would not proceed with catheterization at this point. Continue NTG for now.  4 acute renal failure-etiology unclear. Question low output. Diurese. May need nephrology consult. Avoid nephrotoxic agents. Follow creatinine.  5 Reduced LV systolic function-question secondary to aortic valve stenosis. Will hold beta blocker in setting of acute CHF. No ACE inhibitor or ARB in setting of acute renal failure. May need hydralazine/nitrates later if blood pressure improves.  6 normocytic anemia-hemoglobin at time of admission 8.6. Patient denies hematochezia but does describe dark stools. Question contribution from renal insufficiency. Follow hemoglobin. Further evaluation per primary care.  7 history of mild dementia  8 history of DVT-recent venous Dopplers negative.  For questions or updates, please contact [facility] Please consult [email or web address] for contact info under Cardiology/STEMI.      Signed, Olga Millers, MD  02/10/2017, 8:11 AM

## 2017-02-10 NOTE — Progress Notes (Signed)
PROGRESS NOTE    Leon Freeman  UYQ:[id number] DOB: [birth year] DOA: February 11, 2017 PCP: Renaldo Harrison, DO   Specialists:     Brief Narrative:   83 year old male CABG 12/06/2007, bioprosthetic AVR at the time as well Nonhealing ulcer left lower extremity shin of tibia Right lower DVT 06/2015, R CEA  GI bleed 06/2015 duodenal ulcer Hyperlipidemia  Recent admission to [facility] hospital--Went there for chest pain but did not have ischemic workup with Troponin was slightly elevated given 40 mg of Lasix and cardiology was consulted      Assessment & Plan:   Principal Problem:   Chest pain Active Problems:   S/P AVR (aortic valve replacement) porcine 2009   Hypertension   Anemia   Acute on chronic combined systolic and diastolic CHF (congestive heart failure) (HCC)   Hyperglycemia   ACS (acute coronary syndrome) (HCC)   Status post CABG 2009+ bioprosthetic valve repair Angina  Patient was found to have more acute systolic failure >CAD but because of his creatinine can  undergo cath  Beta-blocker was held because of acute heart failure  Can continue morphine 1-3 mg every 3 as needed for chest pain in addition to nitroglycerin  which would be first choice sublingual 0.4  Continue aspirin 81 daily  Defer IV heparin use and discontinuation to cardiologist    Acute systolic CHF Prior severe aortic stenosis I am not sure if his symptoms are not keeping with worsening aortic stenosis-if this does become the case and the patient will need discussion with cardiology regarding TAVR which he had deferred in the past  Echocardiogram repeat is pending, see above discussion regarding meds  Continue strict in and out charting diuresing with Lasix 40 IV twice daily cautiously given mild  increase in creatinine  Home dose was 30 mg daily Lasix  Daily weights   hyperglycemia  Not known to have any type of blood sugar issues in the past  Follow HbA1c  Hyperlipidemia  Continue Zetia  10 mg at bedtime, Zocor 40 daily  Arthritis  Seems like it was felt that patient had some type of seronegative arthritis and would continue sulfasalazine 500 3 times daily in addition to glucosamine capsules       Consultants:   cardiology  Procedures:   Echocardiogram pending  Antimicrobials:   nad    Subjective:  Fair no issue currently  eatign rinking No chest pain no nausea no vomiting  Objective: Vitals:   02/10/17 0500 02/10/17 0600 02/10/17 0700 02/10/17 1042  BP: (!) 106/57 (!) 99/57 114/61 104/66  Pulse: (!) 101 99 99   Resp: 18 15 15    Temp:   98.3 F (36.8 C) 98.6 F (37 C)  TempSrc:   Oral Oral  SpO2: 94% 93% 93%   Weight:      Height:        Intake/Output Summary (Last 24 hours) at 02/10/2017 1333 Last data filed at 02/10/2017 0700 Gross per 24 hour  Intake 78.5 ml  Output 400 ml  Net -321.5 ml   Filed Weights   02-11-2017 2110 02/10/17 0200  Weight: 79.8 kg (176 lb) 81 kg (178 lb 9.2 oz)    Examination:  Alert pleasant slightly disoriented at times but overall looks well EOMI NCAT arcus senilis No JVD Holosystolic murmur across precordium Abdomen soft No lower extremity edema Neurologically moving all 4 limbs equally the face is symmetric  Data Reviewed: I have personally reviewed following labs and imaging studies  CBC: Recent Labs  Lab  02/11/2017 1752  WBC 8.4  NEUTROABS 6.3  HGB 8.6*  HCT 27.5*  MCV 86.2  PLT 298   Basic Metabolic Panel: Recent Labs  Lab 02/15/2017 1625  NA 137  K 4.6  CL 101  CO2 20*  GLUCOSE 136*  BUN 59*  CREATININE 2.08*  CALCIUM 9.1   GFR: Estimated Creatinine Clearance: 27.4 mL/min (A) (by C-G formula based on SCr of 2.08 mg/dL (H)). Liver Function Tests: No results for input(s): AST, ALT, ALKPHOS, BILITOT, PROT, ALBUMIN in the last 168 hours. No results for input(s): LIPASE, AMYLASE in the last 168 hours. No results for input(s): AMMONIA in the last 168 hours. Coagulation Profile: Recent  Labs  Lab 02/03/2017 1752  INR 1.32   Cardiac Enzymes: Recent Labs  Lab 02/07/2017 1625  TROPONINI 0.47*   BNP (last 3 results) No results for input(s): PROBNP in the last 8760 hours. HbA1C: No results for input(s): HGBA1C in the last 72 hours. CBG: No results for input(s): GLUCAP in the last 168 hours. Lipid Profile: No results for input(s): CHOL, HDL, LDLCALC, TRIG, CHOLHDL, LDLDIRECT in the last 72 hours. Thyroid Function Tests: Recent Labs    02/07/2017 1625  TSH 3.031   Anemia Panel: No results for input(s): VITAMINB12, FOLATE, FERRITIN, TIBC, IRON, RETICCTPCT in the last 72 hours. Urine analysis:    Component Value Date/Time   COLORURINE YELLOW 07/01/2015 0640   APPEARANCEUR CLEAR 07/01/2015 0640   LABSPEC 1.020 07/01/2015 0640   PHURINE 5.0 07/01/2015 0640   GLUCOSEU NEGATIVE 07/01/2015 0640   HGBUR NEGATIVE 07/01/2015 0640   BILIRUBINUR NEGATIVE 07/01/2015 0640   KETONESUR NEGATIVE 07/01/2015 0640   PROTEINUR NEGATIVE 07/01/2015 0640   NITRITE NEGATIVE 07/01/2015 0640   LEUKOCYTESUR NEGATIVE 07/01/2015 0640     Radiology Studies: Reviewed images personally in health database    Scheduled Meds: . aspirin EC  81 mg Oral Daily  . atorvastatin  40 mg Oral q1800  . donepezil  10 mg Oral QHS  . ezetimibe  10 mg Oral QHS  . furosemide  40 mg Intravenous BID  . mouth rinse  15 mL Mouth Rinse BID  . pantoprazole  40 mg Oral Daily  . sodium chloride flush  3 mL Intravenous Q12H  . sulfaSALAzine  500 mg Oral TID   Continuous Infusions: . sodium chloride    . heparin 1,150 Units/hr (02/10/17 1300)  . nitroGLYCERIN 5 mcg/min (02/10/17 1300)     LOS: 1 day    Time spent: 65    Pleas Koch, MD [facility]   If 7PM-7AM, please contact night-coverage [email or web address] Password [email or web address] 02/10/2017, 1:33 PM

## 2017-02-10 NOTE — Consult Note (Signed)
CARDIOLOGY CONSULT NOTE   Referring Physician: Dr. Jonah BlueJennifer Yates Primary Cardiologist: Dr. Prentice DockerSuresh Koneswaran Reason for Consultation: chest pain  HPI: Leon Freeman is a 83 y.o. male w/ CAD s/p 4vCABG and bioprosthetic AVR in 2009, RA, anemia, dementia and HLD p/w chest pain. Of note, the patient is oriented on exam but occasionally answers questions inappropriately.   The patient endorses daily, fleeting symptoms of chest pain for an undetermined period of time (thinks it's been going on for a "long time.") These symptoms however last "not long at all." He reports having had symptoms that worsened over the past day, which led him to come to the hospital for evaluation. He endorses worsening bilateral lower extremity swelling and dyspnea over the past several days as well. He had a recent [facility] at [facility] for [facility] symptoms, a positive troponin, and an echo with a reduced LVEF. It is unclear as to why he did not undergo an invasive workup.   Currently, the patient endorses 2/10 chest pressure over his left precordium. This has improved since starting a nitro drip.   Review of Systems:     Cardiac Review of Systems: {Y] = yes [ ]  = no  Chest Pain [  Y  ]  Resting SOB [   ] Exertional SOB  [ Y ]  Orthopnea [  ]   Pedal Edema [ Y ]    Palpitations [  ] Syncope  [  ]   Presyncope [   ]  General Review of Systems: [Y] = yes [  ]=no Constitional: recent weight change [  ]; anorexia [  ]; fatigue [  ]; nausea [  ]; night sweats [  ]; fever [  ]; or chills [  ];                                                                     Eyes : blurred vision [  ]; diplopia [   ]; vision changes [  ];  Amaurosis fugax[  ]; Resp: cough [  ];  wheezing[  ];  hemoptysis[  ];  PND [  ];  GI:  gallstones[  ], vomiting[  ];  dysphagia[  ]; melena[  ];  hematochezia [  ]; heartburn[  ];   GU: kidney stones [  ]; hematuria[  ];   dysuria [  ];  nocturia[  ]; incontinence [  ];             Skin: rash,  swelling[  ];, hair loss[  ];  peripheral edema[  ];  or itching[  ]; Musculosketetal: myalgias[  ];  joint swelling[  ];  joint erythema[  ];  joint pain[  ];  back pain[  ];  Heme/Lymph: bruising[  ];  bleeding[  ];  anemia[  ];  Neuro: TIA[  ];  headaches[  ];  stroke[  ];  vertigo[  ];  seizures[  ];   paresthesias[  ];  difficulty walking[  ];  Psych:depression[  ]; anxiety[  ];  Endocrine: diabetes[  ];  thyroid dysfunction[  ];  Other:  Past Medical History:  Diagnosis Date  . Anemia    AVMs 2017  . Aortic valve disease 2009  Bioprosthetic AVR at [facility]  . Coronary artery disease 2009   4V CABG at [facility]  . Dementia   . DVT (deep venous thrombosis) (HCC) 2017  . Hyperlipemia   . Rheumatoid arthritis (HCC)     Medications Prior to Admission  Medication Sig Dispense Refill  . aspirin EC 81 MG tablet Take 1 tablet (81 mg total) by mouth daily.    . calcium gluconate 500 MG tablet Take 1 tablet by mouth daily.    Marland Kitchen donepezil (ARICEPT) 10 MG tablet Take 10 mg at bedtime by mouth.    . ezetimibe (ZETIA) 10 MG tablet TAKE ONE TABLET BY MOUTH NIGHTLY AT BEDTIME 30 tablet 1  . furosemide (LASIX) 20 MG tablet Take 1.5 tablets (30 mg total) by mouth daily. 135 tablet 3  . Glucosamine-Chondroit-Vit C-Mn (GLUCOSAMINE 1500 COMPLEX) CAPS Take 1 capsule by mouth daily.     . metoprolol succinate (TOPROL-XL) 25 MG 24 hr tablet Take 1.5 tablets (37.5 mg total) by mouth daily. 135 tablet 1  . METRONIDAZOLE, TOPICAL, (METROLOTION) 0.75 % LOTN Apply at bedtime topically.     . nitroGLYCERIN (NITROSTAT) 0.4 MG SL tablet Place 1 tablet (0.4 mg total) under the tongue every 5 (five) minutes as needed for chest pain. Up to 3 doses. If no relief after 3rd dose, proceed to the ED for an evaluation 25 tablet 3  . omeprazole (PRILOSEC) 40 MG capsule Take 40 mg by mouth daily.    . simvastatin (ZOCOR) 40 MG tablet Take 1 tablet (40 mg total) by mouth at bedtime. 90 tablet 1  . sulfaSALAzine (AZULFIDINE)  500 MG tablet Take 500 mg by mouth 3 (three) times daily.    . vitamin B-12 (CYANOCOBALAMIN) 1000 MCG tablet Take 1,000 mcg by mouth daily.    . vitamin C (ASCORBIC ACID) 500 MG tablet Take 500 mg by mouth daily.    . Vitamins-Lipotropics (LIPO-FLAVONOID PLUS) TABS Take 2 tablets daily by mouth.    . Zinc 25 MG TABS Take 2 tablets by mouth daily.        Marland Kitchen aspirin EC  81 mg Oral Daily  . atorvastatin  40 mg Oral q1800  . donepezil  10 mg Oral QHS  . ezetimibe  10 mg Oral QHS  . mouth rinse  15 mL Mouth Rinse BID  . metoprolol succinate  37.5 mg Oral Daily  . pantoprazole  40 mg Oral Daily  . sodium chloride flush  3 mL Intravenous Q12H  . sulfaSALAzine  500 mg Oral TID    Infusions: . sodium chloride    . heparin 1,000 Units/hr (02/10/17 0300)  . nitroGLYCERIN 5 mcg/min (02/10/17 0340)    Allergies  Allergen Reactions  . Penicillins Itching    Has patient had a PCN reaction causing immediate rash, facial/tongue/throat swelling, SOB or lightheadedness with hypotension: Yes Has patient had a PCN reaction causing severe rash involving mucus membranes or skin necrosis: No Has patient had a PCN reaction that required hospitalization No Has patient had a PCN reaction occurring within the last 10 years: No If all of the above answers are "NO", then may proceed with Cephalosporin use.    . Plaquenil [Hydroxychloroquine Sulfate] Itching and Other (See Comments)    redness    Social History   Socioeconomic History  . Marital status: Married    Spouse name: Not on file  . Number of children: Not on file  . Years of education: Not on file  . Highest education level: Not  on file  Social Needs  . Financial resource strain: Not on file  . Food insecurity - worry: Not on file  . Food insecurity - inability: Not on file  . Transportation needs - medical: Not on file  . Transportation needs - non-medical: Not on file  Occupational History  . Occupation: retired  Tobacco Use  .  Smoking status: Former Smoker    Packs/day: 1.00    Years: 24.00    Pack years: 24.00    Types: Cigarettes    Start date: 05/17/1951    Last attempt to quit: 06/26/1975    Years since quitting: 41.6  . Smokeless tobacco: Never Used  Substance and Sexual Activity  . Alcohol use: No    Alcohol/week: 0.0 oz    Comment: Patient quit 40 years ago.  . Drug use: Not on file  . Sexual activity: Not on file  Other Topics Concern  . Not on file  Social History Narrative  . Not on file    Family History  Problem Relation Age of Onset  . Dementia Mother   . Heart disease Father   . Healthy Daughter   . Bipolar disorder Son     PHYSICAL EXAM: Vitals:   02/10/17 0335 02/10/17 0340  BP: 97/63   Pulse: (!) 102 (!) 101  Resp: 17 13  Temp:    SpO2: 94% 93%     Intake/Output Summary (Last 24 hours) at 02/10/2017 0354 Last data filed at 02/10/2017 0300 Gross per 24 hour  Intake 45 ml  Output 400 ml  Net -355 ml    General:  Elderly, no respiratory difficulty HEENT: normal Neck: supple. JVP elevated to >14 cm H2O. Carotids 2+ bilat; no bruits. No lymphadenopathy or thryomegaly appreciated. Cor: PMI nondisplaced. Regular rate & rhythm. 3/6 crescendo systolic murmur best heard at the LUSB Lungs: bilateral crackles at the bases Abdomen: soft, nontender, nondistended. No hepatosplenomegaly. No bruits or masses. Good bowel sounds. Extremities: no cyanosis, clubbing, rash. 3++ pitting edema of the bilateral lower extremities past the knee Neuro: alert & oriented x 3, cranial nerves grossly intact. moves all 4 extremities w/o difficulty. Affect pleasant.  ECG: sinus tachycardia, RBBB, ST depressions in the precordium  Results for orders placed or performed during the hospital encounter of 2017-02-11 (from the past 24 hour(s))  Basic metabolic panel     Status: Abnormal   Collection Time: 02-11-17  4:25 PM  Result Value Ref Range   Sodium 137 135 - 145 mmol/L   Potassium 4.6 3.5 - 5.1  mmol/L   Chloride 101 101 - 111 mmol/L   CO2 20 (L) 22 - 32 mmol/L   Glucose, Bld 136 (H) 65 - 99 mg/dL   BUN 59 (H) 6 - 20 mg/dL   Creatinine, Ser 7.85 (H) 0.61 - 1.24 mg/dL   Calcium 9.1 8.9 - 88.5 mg/dL   GFR calc non Af Amer 28 (L) >60 mL/min   GFR calc Af Amer 32 (L) >60 mL/min   Anion gap 16 (H) 5 - 15  Troponin I     Status: Abnormal   Collection Time: 11-Feb-2017  4:25 PM  Result Value Ref Range   Troponin I 0.47 (HH) <0.03 ng/mL  TSH     Status: None   Collection Time: 02/11/17  4:25 PM  Result Value Ref Range   TSH 3.031 0.350 - 4.500 uIU/mL  Brain natriuretic peptide     Status: Abnormal   Collection Time: Feb 11, 2017  5:52 PM  Result Value Ref Range   B Natriuretic Peptide 2,090.0 (H) 0.0 - 100.0 pg/mL  CBC with Differential/Platelet     Status: Abnormal   Collection Time: 02-21-2017  5:52 PM  Result Value Ref Range   WBC 8.4 4.0 - 10.5 K/uL   RBC 3.19 (L) 4.22 - 5.81 MIL/uL   Hemoglobin 8.6 (L) 13.0 - 17.0 g/dL   HCT 03.4 (L) 03.5 - 24.8 %   MCV 86.2 78.0 - 100.0 fL   MCH 27.0 26.0 - 34.0 pg   MCHC 31.3 30.0 - 36.0 g/dL   RDW 18.5 90.9 - 31.1 %   Platelets 298 150 - 400 K/uL   Neutrophils Relative % 75 %   Neutro Abs 6.3 1.7 - 7.7 K/uL   Lymphocytes Relative 8 %   Lymphs Abs 0.6 (L) 0.7 - 4.0 K/uL   Monocytes Relative 17 %   Monocytes Absolute 1.4 (H) 0.1 - 1.0 K/uL   Eosinophils Relative 0 %   Eosinophils Absolute 0.0 0.0 - 0.7 K/uL   Basophils Relative 0 %   Basophils Absolute 0.0 0.0 - 0.1 K/uL  Protime-INR     Status: Abnormal   Collection Time: 2017/02/21  5:52 PM  Result Value Ref Range   Prothrombin Time 16.3 (H) 11.4 - 15.2 seconds   INR 1.32    Dg Chest 2 View  Result Date: 02/21/2017 CLINICAL DATA:  Initial evaluation for acute left-sided chest pain. EXAM: CHEST  2 VIEW COMPARISON:  A prior radiograph from 07/06/2015. FINDINGS: Median sternotomy wires underlying surgical clips and CABG markers. Prostatic valve. Moderate cardiomegaly, stable.  Mediastinal silhouette within normal limits. Aortic atherosclerosis. Lungs normally inflated. Diffuse vascular and interstitial congestion. Small layering bilateral pleural effusions, right slightly larger than left. Right basilar opacity favored to reflect atelectasis, although infiltrates not excluded. No other focal airspace disease. No pneumothorax. No acute osseus abnormality. IMPRESSION: 1. Cardiomegaly with mild diffuse pulmonary interstitial congestion without frank pulmonary edema. 2. Trace bilateral pleural effusions, right side larger than left. 3. Associated patchy right basilar opacity. Atelectasis is favored, although infiltrate could be considered in the correct clinical setting. Electronically Signed   By: Rise Mu M.D.   On: 02-21-17 17:07   ASSESSMENT: Leon Freeman is a 83 y.o. male w/ CAD s/p 4vCABG and bioprosthetic AVR in 2009, RA, anemia, dementia and HLD p/w chest pain. Although his history is limited by underlying dementia (or at least delirium), his story and his exam findings are concerning. He first has an acute coronary syndrome which is either a primary Type 1 event or demand mediated in the setting of underlying heart failure. He also is clearly in acute decompensated heart failure, though without evidence of cardiogenic shock. He lastly has a loud systolic murmur, which is concerning for bioprosthetic valve degeneration or thrombus.   PLAN/DISCUSSION: - NPO except meds for cath - TTE for ventricular function and aortic prosthesis function - aspirin 81mg  QD, heparin drip for ACS - start P2Y12 antagonist after cath today - defer ACE given AKI - continue home metoprolol - defer escalating beta blocker dose given acute decompensated heart failure - recommend IV lasix (40-80mg  boluses) to achieve negative 1 to 2L fluid balance after cath - stop home simvastatin; start atorvastatin 80mg  daily - cont home ezetimibe  Rosario Jacks, MD Cardiology  Fellow, PGY-5

## 2017-02-11 DIAGNOSIS — Z515 Encounter for palliative care: Secondary | ICD-10-CM

## 2017-02-11 DIAGNOSIS — I509 Heart failure, unspecified: Secondary | ICD-10-CM

## 2017-02-11 LAB — BASIC METABOLIC PANEL
Anion gap: 15 (ref 5–15)
BUN: 77 mg/dL — AB (ref 6–20)
CALCIUM: 8.8 mg/dL — AB (ref 8.9–10.3)
CHLORIDE: 109 mmol/L (ref 101–111)
CO2: 18 mmol/L — ABNORMAL LOW (ref 22–32)
CREATININE: 2.57 mg/dL — AB (ref 0.61–1.24)
GFR calc non Af Amer: 22 mL/min — ABNORMAL LOW (ref >60)
GFR, EST AFRICAN AMERICAN: 25 mL/min — AB (ref >60)
Glucose, Bld: 109 mg/dL — ABNORMAL HIGH (ref 65–99)
Potassium: 4.6 mmol/L (ref 3.5–5.1)
SODIUM: 142 mmol/L (ref 135–145)

## 2017-02-11 LAB — HEPARIN LEVEL (UNFRACTIONATED): Heparin Unfractionated: 0.28 IU/mL — ABNORMAL LOW (ref 0.30–0.70)

## 2017-02-11 LAB — CBC
HCT: 27.6 % — ABNORMAL LOW (ref 39.0–52.0)
HEMOGLOBIN: 8.2 g/dL — AB (ref 13.0–17.0)
MCH: 25.2 pg — AB (ref 26.0–34.0)
MCHC: 29.7 g/dL — AB (ref 30.0–36.0)
MCV: 84.9 fL (ref 78.0–100.0)
PLATELETS: 267 10*3/uL (ref 150–400)
RBC: 3.25 MIL/uL — ABNORMAL LOW (ref 4.22–5.81)
RDW: 15.6 % — ABNORMAL HIGH (ref 11.5–15.5)
WBC: 10.9 10*3/uL — ABNORMAL HIGH (ref 4.0–10.5)

## 2017-02-11 LAB — GLUCOSE, CAPILLARY: Glucose-Capillary: 133 mg/dL — ABNORMAL HIGH (ref 65–99)

## 2017-02-11 MED ORDER — HEPARIN SODIUM (PORCINE) 5000 UNIT/ML IJ SOLN
5000.0000 [IU] | Freq: Three times a day (TID) | INTRAMUSCULAR | Status: DC
  Administered 2017-02-11 – 2017-02-13 (×8): 5000 [IU] via SUBCUTANEOUS
  Filled 2017-02-11 (×8): qty 1

## 2017-02-11 MED ORDER — FUROSEMIDE 10 MG/ML IJ SOLN
40.0000 mg | Freq: Two times a day (BID) | INTRAMUSCULAR | Status: DC
  Administered 2017-02-11 – 2017-02-13 (×5): 40 mg via INTRAVENOUS
  Filled 2017-02-11 (×6): qty 4

## 2017-02-11 NOTE — Consult Note (Signed)
Consultation Note Date: 02/11/2017   Patient Name: Leon Freeman  DOB: 01/28/1932  MRN: [id number]  Age / Sex: 83 y.o., male  PCP: Leon Heinrich, DO Referring Physician: Nita Sells, MD  Reason for Consultation: Establishing goals of care, Hospice Evaluation and Psychosocial/spiritual support  HPI/Patient Profile: 83 y.o. male  with past medical history of coronary artery disease (CABG 2009, bioprosthetic AVR), history of right lower extremity DVT 06/2015, history of GI bleed 06/2015, hyperlipidemia admitted on 02/22/2017 with chest pain.  Patient has had a recent admission to [facility] where he went in for chest pain.  Patient is now admitted for acute systolic heart failure with a drop in his ejection fraction since September.  Echocardiogram now shows market deterioration in LV function with an EF of 25-30%.  Patient as well as his family are in agreement for no cath, no pressors, no milrinone with goal to focus on comfort  Consult ordered for goals of care  Clinical Assessment and Goals of Care: Met with patient, his wife, as well as son and daughter.  Chart reviewed.  Patient has had a market decline in function as well as cardiac markers.  He has had a sharp functional decline since September where now he is too weak to ambulate on his own and his wife is unable to care for him.  He is short of breath at rest and very short of breath with any sort of exertion.  Patient can participate to a limited degree in decision making process and does seem to agree with the discussions that we were having at his bedside.  His wife, Leon Freeman, would be his healthcare proxy.  He has 1 son, Leon Freeman, who lives in the [place] area, as well as daughter, Leon Freeman.  Ms. Leon Freeman is a PACU traveling nurse, currently under assignment in [place].  We discussed various options including going home with  additional support, hospice but wife is elderly herself and could not do this on her own.  Patient likely does not meet residential hospice criteria of a prognosis of less than 2 weeks and thus are looking at skilled nursing facilities in the [place] area in order to be close to family and friends.    SUMMARY OF RECOMMENDATIONS   DNR/DNI No further cardiac workup including no catheterization no pressors, no milrinone Awaiting physical therapy recommendations, however family wishes to pursue a skilled nursing facility with skilled days for as long as patient can benefit with the understanding that that this may be for a limited time only We did discuss the ability to exercise his hospice Medicare benefit after they have exhausted skilled days Family would like to go to [facility] skilled nursing facility for rehab, with palliative medicine provider to see patient in skilled nursing facility if there is a provider in the [place] area Family has a friend who is a Marine scientist at this facility who they are planning to discuss whether palliative medicine is available in their area and coming to their facility Consult placed  social work to help facilitate skilled nursing facility for rehab with palliative to follow  Code Status/Advance Care Planning:  DNR    Symptom Management:   Dyspnea: Would recommend low-dose morphine concentrate to utilize in facility: 2.5-5 mg every 4 hours as needed for shortness of breath and/or pain.    Palliative Prophylaxis:   Aspiration, Bowel Regimen, Delirium Protocol, Eye Care, Frequent Pain Assessment, Oral Care and Turn Reposition  Additional Recommendations (Limitations, Scope, Preferences):  Full Comfort Care  Psycho-social/Spiritual:   Desire for further Chaplaincy support:no  Additional Recommendations: Referral to Community Resources   Prognosis:   < 6 months in the setting of acute systolic heart failure, marketed deterioration in LV function since  12/30/2016, functional decline as evidenced by increased sleep, decreased oral intake, increased weakness, shortness of breath with any exertion; chronic kidney disease  with creatinine now 2.57  Discharge Planning: [facility] for rehab with Palliative care service follow-up      Primary Diagnoses: Present on Admission: . Chest pain . Acute on chronic combined systolic and diastolic CHF (congestive heart failure) ([place]) . Anemia . Hypertension . Hyperglycemia . ACS (acute coronary syndrome) ([employer])   I have reviewed the medical record, interviewed the patient and family, and examined the patient. The following aspects are pertinent.  Past Medical History:  Diagnosis Date  . Alzheimer disease    /pt 02/10/2017  . Anemia    AVMs 2017  . Aortic valve disease 2009   Bioprosthetic AVR at [facility]  . AVM (arteriovenous malformation)    w/GIB hx/notes 02/10/2017  . Coronary artery disease 2009   4V CABG at [facility]  . DVT (deep venous thrombosis) ([place]) 2017  . Hyperlipemia   . Rheumatoid arthritis ([place])    Social History   Socioeconomic History  . Marital status: Married    Spouse name: None  . Number of children: None  . Years of education: None  . Highest education level: None  Social Needs  . Financial resource strain: None  . Food insecurity - worry: None  . Food insecurity - inability: None  . Transportation needs - medical: None  . Transportation needs - non-medical: None  Occupational History  . Occupation: retired  Tobacco Use  . Smoking status: Former Smoker    Packs/day: 1.00    Years: 24.00    Pack years: 24.00    Types: Cigarettes    Start date: 05/17/1951    Last attempt to quit: 06/26/1975    Years since quitting: 41.6  . Smokeless tobacco: Never Used  Substance and Sexual Activity  . Alcohol use: No    Comment: Patient quit 40 years ago.  . Drug use: No  . Sexual activity: None  Other Topics Concern  . None  Social History Narrative  . None    Family History  Problem Relation Age of Onset  . Dementia Mother   . Heart disease Father   . Healthy Daughter   . Bipolar disorder Son    Scheduled Meds: . aspirin EC  81 mg Oral Daily  . donepezil  10 mg Oral QHS  . furosemide  40 mg Intravenous BID  . heparin subcutaneous  5,000 Units Subcutaneous Q8H  . mouth rinse  15 mL Mouth Rinse BID  . pantoprazole  40 mg Oral Daily  . sodium chloride flush  3 mL Intravenous Q12H  . sulfaSALAzine  500 mg Oral TID   Continuous Infusions: . sodium chloride     PRN Meds:.sodium chloride,  acetaminophen, morphine injection, nitroGLYCERIN, ondansetron (ZOFRAN) IV, sodium chloride flush Medications Prior to Admission:  Prior to Admission medications   Medication Sig Start Date End Date Taking? Authorizing Provider  aspirin EC 81 MG tablet Take 1 tablet (81 mg total) by mouth daily. 07/01/15  Yes Rehman, Mechele Dawley, MD  calcium gluconate 500 MG tablet Take 1 tablet by mouth daily.   Yes [provider]  donepezil (ARICEPT) 10 MG tablet Take 10 mg at bedtime by mouth.   Yes [provider]  ezetimibe (ZETIA) 10 MG tablet TAKE ONE TABLET BY MOUTH NIGHTLY AT BEDTIME 12/08/16  Yes Herminio Commons, MD  furosemide (LASIX) 20 MG tablet Take 1.5 tablets (30 mg total) by mouth daily. 08/09/16  Yes Herminio Commons, MD  Glucosamine-Chondroit-Vit C-Mn (GLUCOSAMINE 1500 COMPLEX) CAPS Take 1 capsule by mouth daily.    Yes [provider]  metoprolol succinate (TOPROL-XL) 25 MG 24 hr tablet Take 1.5 tablets (37.5 mg total) by mouth daily. 01/04/17  Yes Herminio Commons, MD  METRONIDAZOLE, TOPICAL, (METROLOTION) 0.75 % LOTN Apply at bedtime topically.    Yes [provider]  nitroGLYCERIN (NITROSTAT) 0.4 MG SL tablet Place 1 tablet (0.4 mg total) under the tongue every 5 (five) minutes as needed for chest pain. Up to 3 doses. If no relief after 3rd dose, proceed to the ED for an evaluation 01/04/17  Yes Herminio Commons, MD  omeprazole (PRILOSEC) 40 MG capsule Take 40 mg by mouth daily.   Yes [provider]  simvastatin (ZOCOR) 40 MG tablet Take 1 tablet (40 mg total) by mouth at bedtime. 01/04/17  Yes Herminio Commons, MD  sulfaSALAzine (AZULFIDINE) 500 MG tablet Take 500 mg by mouth 3 (three) times daily.   Yes [provider]  vitamin B-12 (CYANOCOBALAMIN) 1000 MCG tablet Take 1,000 mcg by mouth daily.   Yes [provider]  vitamin C (ASCORBIC ACID) 500 MG tablet Take 500 mg by mouth daily.   Yes [provider]  Vitamins-Lipotropics (LIPO-FLAVONOID PLUS) TABS Take 2 tablets daily by mouth.   Yes [provider]  Zinc 25 MG TABS Take 2 tablets by mouth daily.    Yes [provider]   Allergies  Allergen Reactions  . Penicillins Itching    Has patient had a PCN reaction causing immediate rash, facial/tongue/throat swelling, SOB or lightheadedness with hypotension: Yes Has patient had a PCN reaction causing severe rash involving mucus membranes or skin necrosis: No Has patient had a PCN reaction that required hospitalization No Has patient had a PCN reaction occurring within the last 10 years: No If all of the above answers are "NO", then may proceed with Cephalosporin use.    . Plaquenil [Hydroxychloroquine Sulfate] Itching and Other (See Comments)    redness   Review of Systems  Unable to perform ROS: Acuity of condition    Physical Exam  Constitutional:  Frail, elderly man.  He is somnolent, but will answer simple questions  HENT:  Head: Normocephalic and atraumatic.  Neck: Normal range of motion.  Cardiovascular: Normal rate.  Pulmonary/Chest:  Increased work of breathing  Abdominal: Soft.  Musculoskeletal: Normal range of motion.  Very weak; 2 person assist  Neurological:  Somnolent but does awaken to voice and answer simple questions yes or no  Psychiatric:  Affect constricted  Nursing note and vitals reviewed.   Vital  Signs: BP 106/68   Pulse (!) 111   Temp (!) 97.5 F (36.4 C) (Oral)  Resp 20   Ht 5' 7"  (1.702 m)   Wt 82.9 kg (182 lb 12.2 oz)   SpO2 95%   BMI 28.62 kg/m  Pain Assessment: No/denies pain   Pain Score: 2    SpO2: SpO2: 95 % O2 Device:SpO2: 95 % O2 Flow Rate: .O2 Flow Rate (L/min): 2 L/min  IO: Intake/output summary:   Intake/Output Summary (Last 24 hours) at 02/11/2017 1714 Last data filed at 02/11/2017 1030 Gross per 24 hour  Intake 485.75 ml  Output 1515 ml  Net -1029.25 ml    LBM: Last BM Date: 02/16/2017 Baseline Weight: Weight: 79.8 kg (176 lb) Most recent weight: Weight: 82.9 kg (182 lb 12.2 oz)     Palliative Assessment/Data:   Flowsheet Rows     Most Recent Value  Intake Tab  Referral Department  Hospitalist  Unit at Time of Referral  Cardiac/Telemetry Unit  Palliative Care Primary Diagnosis  Cardiac  Date Notified  02/11/17  Palliative Care Type  New Palliative care  Reason for referral  Clarify Goals of Care  Date of Admission  02/17/2017  Date first seen by Palliative Care  02/11/17  # of days Palliative referral response time  0 Day(s)  # of days IP prior to Palliative referral  2  Clinical Assessment  Palliative Performance Scale Score  40%  Pain Max last 24 hours  Not able to report  Pain Min Last 24 hours  Not able to report  Dyspnea Max Last 24 Hours  Not able to report  Dyspnea Min Last 24 hours  Not able to report  Nausea Max Last 24 Hours  Not able to report  Nausea Min Last 24 Hours  Not able to report  Anxiety Max Last 24 Hours  Not able to report  Anxiety Min Last 24 Hours  Not able to report  Other Max Last 24 Hours  Not able to report  Psychosocial & Spiritual Assessment  Palliative Care Outcomes  Patient/Family meeting held?  Yes  Who was at the meeting?  pt's wife, dtr, and son  Patient/Family wishes: Interventions discontinued/not started   Mechanical Ventilation, BiPAP, Hemodialysis, Transfusion, Tube feedings/TPN, PEG, Trach,  NIPPV, Vasopressors      Time In: 1400 Time Out: 1515 Time Total: 75 min Greater than 50%  of this time was spent counseling and coordinating care related to the above assessment.  Staffed with case Freight forwarder as well as Physiological scientist and plan.  Signed by: Dory Horn, NP   Please contact Palliative Medicine Team phone at [phone] for questions and concerns.  For individual provider: See Shea Evans

## 2017-02-11 NOTE — Progress Notes (Signed)
Progress Note  Patient Name: Leon Freeman Date of Encounter: 02/11/2017  Primary Cardiologist: Dr Purvis Sheffield and Dr Clifton James  Subjective   No chest pain or dyspnea  Inpatient Medications    Scheduled Meds: . aspirin EC  81 mg Oral Daily  . atorvastatin  40 mg Oral q1800  . donepezil  10 mg Oral QHS  . ezetimibe  10 mg Oral QHS  . mouth rinse  15 mL Mouth Rinse BID  . pantoprazole  40 mg Oral Daily  . sodium chloride flush  3 mL Intravenous Q12H  . sulfaSALAzine  500 mg Oral TID   Continuous Infusions: . sodium chloride    . heparin 1,300 Units/hr (02/10/17 2230)  . nitroGLYCERIN 5 mcg/min (02/10/17 1300)   PRN Meds: sodium chloride, acetaminophen, morphine injection, nitroGLYCERIN, ondansetron (ZOFRAN) IV, sodium chloride flush   Vital Signs    Vitals:   02/10/17 1956 02/11/17 0002 02/11/17 0422 02/11/17 0700  BP: 114/62  (!) 94/52 99/62  Pulse: (!) 114  (!) 110 (!) 111  Resp: (!) 24  16 19   Temp: 98.5 F (36.9 C)  98.1 F (36.7 C) 98 F (36.7 C)  TempSrc: Oral  Oral Oral  SpO2: 96%  92% 90%  Weight:  182 lb 12.2 oz (82.9 kg)    Height:        Intake/Output Summary (Last 24 hours) at 02/11/2017 0847 Last data filed at 02/11/2017 0700 Gross per 24 hour  Intake 485.75 ml  Output 800 ml  Net -314.25 ml   Filed Weights   02/16/2017 2110 02/10/17 0200 02/11/17 0002  Weight: 176 lb (79.8 kg) 178 lb 9.2 oz (81 kg) 182 lb 12.2 oz (82.9 kg)    Telemetry    Sinus tach with PVCs and couplet- Personally Reviewed   Physical Exam   GEN: WD/WN No acute distress.   Neck: supple, + JVD Cardiac: tachycardic, 2/6 systolic murmur Respiratory: Mildly diminished BS bases GI: Soft, nontender, non-distended, no masses MS: 2+ edema; no significant change Neuro:  grossly intact   Labs    Chemistry Recent Labs  Lab 02/10/2017 1625 02/11/17 0428  NA 137 142  K 4.6 4.6  CL 101 109  CO2 20* 18*  GLUCOSE 136* 109*  BUN 59* 77*  CREATININE 2.08* 2.57*  CALCIUM  9.1 8.8*  GFRNONAA 28* 22*  GFRAA 32* 25*  ANIONGAP 16* 15     Hematology Recent Labs  Lab 02/10/2017 1752 02/11/17 0428  WBC 8.4 10.9*  RBC 3.19* 3.25*  HGB 8.6* 8.2*  HCT 27.5* 27.6*  MCV 86.2 84.9  MCH 27.0 25.2*  MCHC 31.3 29.7*  RDW 15.2 15.6*  PLT 298 267    Cardiac Enzymes Recent Labs  Lab 02/08/2017 1625  TROPONINI 0.47*    BNP Recent Labs  Lab 02/27/2017 1752  BNP 2,090.0*      Radiology    Dg Chest 2 View  Result Date: 02/11/2017 CLINICAL DATA:  Initial evaluation for acute left-sided chest pain. EXAM: CHEST  2 VIEW COMPARISON:  A prior radiograph from 07/06/2015. FINDINGS: Median sternotomy wires underlying surgical clips and CABG markers. Prostatic valve. Moderate cardiomegaly, stable. Mediastinal silhouette within normal limits. Aortic atherosclerosis. Lungs normally inflated. Diffuse vascular and interstitial congestion. Small layering bilateral pleural effusions, right slightly larger than left. Right basilar opacity favored to reflect atelectasis, although infiltrates not excluded. No other focal airspace disease. No pneumothorax. No acute osseus abnormality. IMPRESSION: 1. Cardiomegaly with mild diffuse pulmonary interstitial congestion without frank pulmonary edema.  2. Trace bilateral pleural effusions, right side larger than left. 3. Associated patchy right basilar opacity. Atelectasis is favored, although infiltrate could be considered in the correct clinical setting. Electronically Signed   By: Rise Mu M.D.   On: 02/17/2017 17:07    Patient Profile     83 y.o. 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 Last  echocardiogram in [facility] 12/29/2016 showed normal LV function, prosthetic aortic valve with severe stenosis with mean gradient 40 mmHg (possible contribution from patient/prosthesis mismatch) and mild aortic insufficiency mild mitral regurgitation. Recently hospitalized in [place] for chest pain and by report echocardiogram now showed ejection fraction 25-30%. Troponin was elevated at 0.8.  Assessment & Plan    1 acute systolic congestive heart failure-patient remains volume overloaded. His renal function is worse. Long discussion with patient, daughter and wife this morning about aggressiveness of care. He is 83 years old and does not want heroic measures. He is a no CODE BLUE including no intubation, no CPR and no defibrillation. They do not want aggressive measures such as cardiac catheterization or inotropes. They have requested a palliative care consult. Note echocardiogram shows severely reduced LV systolic function and severe aortic stenosis. We will continue diuresis for excess volume.  2 history of severe aortic stenosis-echocardiogram shows severely reduced LV systolic function and severe aortic stenosis. However they do not want aggressive measures.  3 elevated troponin-given reduced LV function and wall motion abnormalities patient possibly had a recent myocardial infarction. They have requested medical therapy. Discontinue IV nitroglycerin and IV heparin. Continue aspirin and statin. I will not add a beta blocker given acute systolic congestive heart failure. No ARB given renal insufficiency. Prognosis appears to be poor.  4 acute renal failure-etiology unclear but likely low output. We discussed the addition of milrinone and other inotropes but they prefer conservative measures and comfort..  5 Reduced LV systolic function-No beta blocker in setting of acute CHF. No ACE inhibitor or ARB in setting of acute renal failure.  6 normocytic anemia-Question contribution from renal  insufficiency. Follow hemoglobin. Further evaluation per primary care.  7 history of mild dementia  8 history of DVT-recent venous Dopplers negative.  For questions or updates, please contact [facility] Please consult [email or web address] for contact info under Cardiology/STEMI.      Signed, Olga Millers, MD  02/11/2017, 8:47 AM

## 2017-02-11 NOTE — Progress Notes (Signed)
PROGRESS NOTE    Leon Freeman  IFO:[id number] DOB: December 07, 1931 DOA: 02/12/2017 PCP: Renaldo Harrison, DO   Specialists:     Brief Narrative:   83 year old male CABG 12/06/2007, bioprosthetic AVR at the time as well Nonhealing ulcer left lower extremity shin of tibia Right lower DVT 06/2015, R CEA  GI bleed 06/2015 duodenal ulcer Hyperlipidemia  Recent admission to [facility] hospital--Went there for chest pain but did not have ischemic workup with Troponin was slightly elevated given 40 mg of Lasix and cardiology was consulted   Assessment & Plan:   Principal Problem:   Chest pain Active Problems:   S/P AVR (aortic valve replacement) porcine 2009   Hypertension   Anemia   Acute on chronic combined systolic and diastolic CHF (congestive heart failure) (HCC)   Hyperglycemia   ACS (acute coronary syndrome) (HCC)   Status post CABG 2009+ bioprosthetic valve repair Angina  Patient was found to have more acute systolic failure >CAD but because of his creatinine cannot undergo cath  Beta-blocker was held because of acute heart failure  morphine 1-3 mg every 3 as needed for chest pain + nitroglycerin which would be first choice sublingual 0.4  Continue aspirin 81 daily  Defer IV heparin use and discontinuation to cardiologist  Family thinking about palliative care given poor prognosis and after long discussions about Acute HF with family by cardiology and discussions with me-we have called palliative in to see  Doubtful that patient can go home-may require SNf + pallaitive on d/c   Acute systolic CHF with drop EF+ new WM anomalies Lad Lcx Prior severe aortic stenosis non TAVR candidate family/pt not wishing aggressive measures  Echocardiogram shows drop EF/WM anomaly  diuresing with Lasix 40 IV twice daily  Home dose was 30 mg daily Lasix-  AKI  Large bump in creatinine--goal now more comfort so continue to diurese regardless   hyperglycemia  Not known to have any type  of blood sugar issues in the past  Follow HbA1c  Hyperlipidemia  Continue Zetia 10 mg at bedtime, Zocor 40 daily  Arthritis  Seems like it was felt that patient had some type of seronegative arthritis and would continue sulfasalazine 500 3 times daily in addition to glucosamine capsules  Dementia  Continue Aricept 10 qhs    Long discussion with daughter who is a Engineer, civil (consulting) and patient spouse  Keep on 6 c today--trasnfer to pallaitve unit  therapy to eval    Consultants:   cardiology  Procedures:   Echocardiogram pending  Antimicrobials:   nad    Subjective:  Pleasant in nad No cp No fever no chills no cough In good spirits  Objective: Vitals:   02/11/17 0002 02/11/17 0422 02/11/17 0700 02/11/17 1027  BP:  (!) 94/52 99/62 (!) 105/55  Pulse:  (!) 110 (!) 111 (!) 114  Resp:  16 19 (!) 26  Temp:  98.1 F (36.7 C) 98 F (36.7 C) 98.5 F (36.9 C)  TempSrc:  Oral Oral Oral  SpO2:  92% 90% (!) 86%  Weight: 82.9 kg (182 lb 12.2 oz)     Height:        Intake/Output Summary (Last 24 hours) at 02/11/2017 1325 Last data filed at 02/11/2017 1030 Gross per 24 hour  Intake 485.75 ml  Output 1515 ml  Net -1029.25 ml   Filed Weights   02/06/2017 2110 02/10/17 0200 02/11/17 0002  Weight: 79.8 kg (176 lb) 81 kg (178 lb 9.2 oz) 82.9 kg (182 lb 12.2 oz)  Examination:  overall looks well EOMI NCAT arcus senilis Cannot assess jvd cta b No rales LE swollen stg 1 edema Neuro intact without acute deficit  Data Reviewed: I have personally reviewed following labs and imaging studies  CBC: Recent Labs  Lab 03/05/17 1752 02/11/17 0428  WBC 8.4 10.9*  NEUTROABS 6.3  --   HGB 8.6* 8.2*  HCT 27.5* 27.6*  MCV 86.2 84.9  PLT 298 267   Basic Metabolic Panel: Recent Labs  Lab 03/05/17 1625 02/11/17 0428  NA 137 142  K 4.6 4.6  CL 101 109  CO2 20* 18*  GLUCOSE 136* 109*  BUN 59* 77*  CREATININE 2.08* 2.57*  CALCIUM 9.1 8.8*   GFR: Estimated Creatinine  Clearance: 22.4 mL/min (A) (by C-G formula based on SCr of 2.57 mg/dL (H)). Liver Function Tests: No results for input(s): AST, ALT, ALKPHOS, BILITOT, PROT, ALBUMIN in the last 168 hours. No results for input(s): LIPASE, AMYLASE in the last 168 hours. No results for input(s): AMMONIA in the last 168 hours. Coagulation Profile: Recent Labs  Lab 2017-03-05 1752  INR 1.32   Cardiac Enzymes: Recent Labs  Lab 03/05/2017 1625  TROPONINI 0.47*   BNP (last 3 results) No results for input(s): PROBNP in the last 8760 hours. HbA1C: No results for input(s): HGBA1C in the last 72 hours. CBG: Recent Labs  Lab 02/11/17 1224  GLUCAP 133*   Lipid Profile: No results for input(s): CHOL, HDL, LDLCALC, TRIG, CHOLHDL, LDLDIRECT in the last 72 hours. Thyroid Function Tests: Recent Labs    March 05, 2017 1625  TSH 3.031   Anemia Panel: No results for input(s): VITAMINB12, FOLATE, FERRITIN, TIBC, IRON, RETICCTPCT in the last 72 hours. Urine analysis:    Component Value Date/Time   COLORURINE YELLOW 07/01/2015 0640   APPEARANCEUR CLEAR 07/01/2015 0640   LABSPEC 1.020 07/01/2015 0640   PHURINE 5.0 07/01/2015 0640   GLUCOSEU NEGATIVE 07/01/2015 0640   HGBUR NEGATIVE 07/01/2015 0640   BILIRUBINUR NEGATIVE 07/01/2015 0640   KETONESUR NEGATIVE 07/01/2015 0640   PROTEINUR NEGATIVE 07/01/2015 0640   NITRITE NEGATIVE 07/01/2015 0640   LEUKOCYTESUR NEGATIVE 07/01/2015 0640     Radiology Studies: Reviewed images personally in health database    Scheduled Meds: . aspirin EC  81 mg Oral Daily  . donepezil  10 mg Oral QHS  . furosemide  40 mg Intravenous BID  . heparin subcutaneous  5,000 Units Subcutaneous Q8H  . mouth rinse  15 mL Mouth Rinse BID  . pantoprazole  40 mg Oral Daily  . sodium chloride flush  3 mL Intravenous Q12H  . sulfaSALAzine  500 mg Oral TID   Continuous Infusions: . sodium chloride       LOS: 2 days    Time spent: 50    Pleas Koch, MD [facility] ([phone]   If 7PM-7AM, please contact night-coverage [email or web address] Password [email or web address] 02/11/2017, 1:25 PM

## 2017-02-11 NOTE — Progress Notes (Signed)
Pharmacy notified of heparin level of greater than 2.2. Pharmacist is having lab redrawn.

## 2017-02-11 NOTE — Progress Notes (Signed)
SATURATION QUALIFICATIONS: (This note is used to comply with regulatory documentation for home oxygen)  Patient Saturations on Room Air at Rest = 85%  Patient Saturations on Room Air while Ambulating = not applicable  Patient Saturations on  Liters of oxygen while Ambulating = not applicable  Please briefly explain why patient needs home oxygen: Patient is fluid overloaded from acute renal failure and  heart failure secondary to chronic valve disease.  Surgery for heart valve repair is no longer an option.

## 2017-02-12 LAB — BASIC METABOLIC PANEL
ANION GAP: 14 (ref 5–15)
BUN: 86 mg/dL — ABNORMAL HIGH (ref 6–20)
CALCIUM: 8.8 mg/dL — AB (ref 8.9–10.3)
CO2: 20 mmol/L — ABNORMAL LOW (ref 22–32)
Chloride: 111 mmol/L (ref 101–111)
Creatinine, Ser: 2.72 mg/dL — ABNORMAL HIGH (ref 0.61–1.24)
GFR, EST AFRICAN AMERICAN: 23 mL/min — AB (ref >60)
GFR, EST NON AFRICAN AMERICAN: 20 mL/min — AB (ref >60)
GLUCOSE: 116 mg/dL — AB (ref 65–99)
Potassium: 4.5 mmol/L (ref 3.5–5.1)
SODIUM: 145 mmol/L (ref 135–145)

## 2017-02-12 NOTE — Progress Notes (Signed)
No Charge Note  Received voicemail on palliative medicine line from [facility].  I did not speak with her but she left a message as follows:   Caller reports that family has contacted [facility] (skilled facility in [place], [place] at [address]) and were told that there would be a bed available for rehab for him on Monday.  Caller requested that "general medical paperwork" be faxed to [phone].  Reports admission coordinator's name is Neysa Bonito and she can be reached at [phone].  She also reports family will need more information about arranging transportation.  She requested a return call to help coordinate to his son Loraine Leriche at [phone] if there are any questions.  As this is regarding disposition, it is not something palliative medicine team will be able to help them with.  I will therefore place a call to social work and let them know about the above message.  Romie Minus, MD [facility] Palliative Medicine Team [phone]

## 2017-02-12 NOTE — Progress Notes (Signed)
Patient had 5 beat run of vtach, was being assisted to sit in chair. Stated he did not feel any different. He was unaware of heart racing. Currently is sinus tach at 110. MD notified.

## 2017-02-12 NOTE — Progress Notes (Signed)
PROGRESS NOTE    Leon Freeman  NLZ:[id number] DOB: 01-21-1932 DOA: 02-20-17 PCP: Renaldo Harrison, DO   Specialists:     Brief Narrative:   83 year old male CABG 12/06/2007, bioprosthetic AVR at the time as well Nonhealing ulcer left lower extremity shin of tibia Right lower DVT 06/2015, R CEA  GI bleed 06/2015 duodenal ulcer Hyperlipidemia  Recent admission to [facility] hospital--Went there for chest pain but did not have ischemic workup with Troponin was slightly elevated given 40 mg of Lasix and cardiology was consulted   Assessment & Plan:   Principal Problem:   Chest pain Active Problems:   S/P AVR (aortic valve replacement) porcine 2009   Hypertension   Anemia   Acute on chronic combined systolic and diastolic CHF (congestive heart failure) (HCC)   Hyperglycemia   ACS (acute coronary syndrome) (HCC)   Heart failure (HCC)   Palliative care by specialist   Status post CABG 2009+ bioprosthetic valve repair Angina  Patient was found to have more acute systolic failure >CAD but because of his creatinine cannot undergo cath  Beta-blocker was held because of acute heart failure  morphine 1-3 mg every 3 as needed for chest pain + nitroglycerin which would be first choice sublingual 0.4  Continue aspirin 81 daily  Now full comfort care-- require SNf + pallaitive on d/c--[facility] to follow-family have already contacted a  Facility  D/c monitors   Acute systolic CHF with drop EF+ new WM anomalies Lad Lcx Prior severe aortic stenosis non TAVR candidate family/pt not wishing aggressive measures  Echocardiogram shows drop EF/WM anomaly  diuresing with Lasix 40 IV twice daily  Home dose was 30 mg daily Lasix-change to po liquid lasix on d/c  No further labs  AKI  Large bump in creatinine--goal now more comfort so continue to diurese regardless   hyperglycemia  Not known to have any type of blood sugar issues in the past  Follow HbA1c  Hyperlipidemia  D/c Zetia 10  mg at bedtime, Zocor 40 daily--not compatible with comfort  Arthritis  Seems like it was felt that patient had some type of seronegative arthritis and would continue sulfasalazine 500 3 times daily in addition to glucosamine capsules  Dementia  Continue Aricept 10 qhs    D/w sone and family bedside  Keep on 6 c today--trasnfer to pallaitve unit  therapy recs SNF  SW input pending--probably can d/[facility] 11[facility]12   Consultants:   cardiology  Procedures:   Echocardiogram drop in EF  Antimicrobials:   nad    Subjective:  No distress doesn't remember having lunch but nursing reports he did Vtach was episodic today No cp No n/v  Objective: Vitals:   02/11/17 1630 02/11/17 1950 02/12/17 0437 02/12/17 1445  BP: 106/68 102/65 (!) 94/57 100/64  Pulse: (!) 111 (!) 114 65 (!) 107  Resp: 20 19 18 18   Temp: (!) 97.5 F (36.4 C) 98.4 F (36.9 C) 97.9 F (36.6 C) 98.3 F (36.8 C)  TempSrc: Oral  Oral Oral  SpO2: 95% 91% 96% 94%  Weight:  82.1 kg (180 lb 16 oz)    Height:  5\' 7"  (1.702 m)      Intake/Output Summary (Last 24 hours) at 02/12/2017 1730 Last data filed at 02/12/2017 1447 Gross per 24 hour  Intake 603 ml  Output 900 ml  Net -297 ml   Filed Weights   02/10/17 0200 02/11/17 0002 02/11/17 1950  Weight: 81 kg (178 lb 9.2 oz) 82.9 kg (  182 lb 12.2 oz) 82.1 kg (180 lb 16 oz)    Examination:  looks well arcus senilis Cannot assess jvd cta b No rales LE swollen stg 2 edema Neuro intact without acute deficit  Data Reviewed: I have personally reviewed following labs and imaging studies  CBC: Recent Labs  Lab 02/05/2017 1752 02/11/17 0428  WBC 8.4 10.9*  NEUTROABS 6.3  --   HGB 8.6* 8.2*  HCT 27.5* 27.6*  MCV 86.2 84.9  PLT 298 267   Basic Metabolic Panel: Recent Labs  Lab 02/18/2017 1625 02/11/17 0428 02/12/17 0543  NA 137 142 145  K 4.6 4.6 4.5  CL 101 109 111  CO2 20* 18* 20*  GLUCOSE 136* 109* 116*  BUN 59* 77* 86*  CREATININE  2.08* 2.57* 2.72*  CALCIUM 9.1 8.8* 8.8*   GFR: Estimated Creatinine Clearance: 21.1 mL/min (A) (by C-G formula based on SCr of 2.72 mg/dL (H)). Liver Function Tests: No results for input(s): AST, ALT, ALKPHOS, BILITOT, PROT, ALBUMIN in the last 168 hours. No results for input(s): LIPASE, AMYLASE in the last 168 hours. No results for input(s): AMMONIA in the last 168 hours. Coagulation Profile: Recent Labs  Lab 02/19/2017 1752  INR 1.32   Cardiac Enzymes: Recent Labs  Lab 02/27/2017 1625  TROPONINI 0.47*   BNP (last 3 results) No results for input(s): PROBNP in the last 8760 hours. HbA1C: No results for input(s): HGBA1C in the last 72 hours. CBG: Recent Labs  Lab 02/11/17 1224  GLUCAP 133*   Lipid Profile: No results for input(s): CHOL, HDL, LDLCALC, TRIG, CHOLHDL, LDLDIRECT in the last 72 hours. Thyroid Function Tests: No results for input(s): TSH, T4TOTAL, FREET4, T3FREE, THYROIDAB in the last 72 hours. Anemia Panel: No results for input(s): VITAMINB12, FOLATE, FERRITIN, TIBC, IRON, RETICCTPCT in the last 72 hours. Urine analysis:    Component Value Date/Time   COLORURINE YELLOW 07/01/2015 0640   APPEARANCEUR CLEAR 07/01/2015 0640   LABSPEC 1.020 07/01/2015 0640   PHURINE 5.0 07/01/2015 0640   GLUCOSEU NEGATIVE 07/01/2015 0640   HGBUR NEGATIVE 07/01/2015 0640   BILIRUBINUR NEGATIVE 07/01/2015 0640   KETONESUR NEGATIVE 07/01/2015 0640   PROTEINUR NEGATIVE 07/01/2015 0640   NITRITE NEGATIVE 07/01/2015 0640   LEUKOCYTESUR NEGATIVE 07/01/2015 0640     Radiology Studies: Reviewed images personally in health database    Scheduled Meds: . aspirin EC  81 mg Oral Daily  . donepezil  10 mg Oral QHS  . furosemide  40 mg Intravenous BID  . heparin subcutaneous  5,000 Units Subcutaneous Q8H  . mouth rinse  15 mL Mouth Rinse BID  . pantoprazole  40 mg Oral Daily  . sodium chloride flush  3 mL Intravenous Q12H  . sulfaSALAzine  500 mg Oral TID   Continuous  Infusions: . sodium chloride       LOS: 3 days    Time spent: 51    Pleas Koch, MD [facility] ([phone]   If 7PM-7AM, please contact night-coverage [email or web address] Password [email or web address] 02/12/2017, 5:30 PM

## 2017-02-12 NOTE — Evaluation (Signed)
Occupational Therapy Evaluation Patient Details Name: Leon Freeman MRN: [id number] DOB: 1931/11/06 Today's Date: 02/12/2017    History of Present Illness 83 y.o. male  with past medical history of coronary artery disease (CABG 2009, bioprosthetic AVR), history of right lower extremity DVT 06/2015, history of GI bleed 06/2015, hyperlipidemia admitted on 02/12/2017 with chest pain.  Patient has had a recent admission to [facility] where he went in for chest pain.  Patient is now admitted for acute systolic heart failure with a drop in his ejection fraction since September.  Echocardiogram now shows market deterioration in LV function with an EF of 25-30%.   Clinical Impression   Pt admitted with the above diagnoses and presents with below problem list. Pt will benefit from continued acute OT to address the below listed deficits and maximize independence with basic ADLs prior to d/c to venue below. PTA pt reports needing some assistance with bathing/dressing. Pt is currently mod-max A with functional transfers and LB ADLs, likely +2 for functional mobility. Cannot find in pt's history a mention of the injury to his RUE for which he currently wears a forearm to hand brace. Unclear WB status of RUE so tried to treat as NWB.     Follow Up Recommendations  SNF    Equipment Recommendations  Other (comment)(defer to next venue)    Recommendations for Other Services       Precautions / Restrictions Precautions Precautions: Fall Restrictions Weight Bearing Restrictions: Yes Other Position/Activity Restrictions: R forearm to hand brace. Pt reports fx in wrist about a month ago. Unclear what current RUE WB status is.       Mobility Bed Mobility Overal bed mobility: Needs Assistance Bed Mobility: Supine to Sit     Supine to sit: Mod assist;HOB elevated     General bed mobility comments: assist to powerup trunk to EOB. extra time and effort.   Transfers Overall transfer level:  Needs assistance Equipment used: 2 person hand held assist;1 person hand held assist Transfers: Sit to/from [employer] Sit to Stand: Max assist;Mod assist Stand pivot transfers: Mod assist       General transfer comment: max>mod A with sit>stand from EOB. Wide BOS and a bit of a posterior lean at first. Mod to pivot. Extra time and effort.     Balance Overall balance assessment: Needs assistance Sitting-balance support: Single extremity supported;Feet supported Sitting balance-Leahy Scale: Fair       Standing balance-Leahy Scale: Poor Standing balance comment: external support for balance                           ADL either performed or assessed with clinical judgement   ADL Overall ADL's : Needs assistance/impaired Eating/Feeding: Set up;Sitting   Grooming: Minimal assistance;Sitting Grooming Details (indicate cue type and reason): struggled with placement of dentures Upper Body Bathing: Sitting;Maximal assistance   Lower Body Bathing: Maximal assistance;Sit to/from stand   Upper Body Dressing : Maximal assistance;Sitting   Lower Body Dressing: Maximal assistance;Sit to/from stand   Toilet Transfer: Moderate assistance;Stand-pivot;Maximal assistance Toilet Transfer Details (indicate cue type and reason): simulated with EOB>recliner Toileting- Clothing Manipulation and Hygiene: Maximal assistance;Sit to/from stand   Tub/ Shower Transfer: Maximal assistance;Stand-pivot     General ADL Comments: Pt completed bed mobility, sat EOB about 5 minutes and simulated toilet transfer as detailed above.      Vision         Perception  Praxis      Pertinent Vitals/Pain Pain Assessment: Faces Pain Score: 0-No pain     Hand Dominance     Extremity/Trunk Assessment Upper Extremity Assessment Upper Extremity Assessment: Generalized weakness;RUE deficits/detail RUE Deficits / Details: pt reports fx in wrist from about a month ago.  forearm-hand brace in place.   Lower Extremity Assessment Lower Extremity Assessment: Defer to PT evaluation       Communication Communication Communication: HOH   Cognition Arousal/Alertness: Awake/alert Behavior During Therapy: WFL for tasks assessed/performed Overall Cognitive Status: History of cognitive impairments - at baseline                                     General Comments  Son present during session.     Exercises     Shoulder Instructions      Home Living Family/patient expects to be discharged to:: Private residence Living Arrangements: Spouse/significant other   Type of Home: House Home Access: Stairs to enter           Bathroom Shower/Tub: Tub/shower unit         Home Equipment: None          Prior Functioning/Environment Level of Independence: Needs assistance    ADL's / Homemaking Assistance Needed: endorses needing some assistance with bathing/dressing at baseline.            OT Problem List: Decreased activity tolerance;Decreased strength;Impaired balance (sitting and/or standing);Decreased knowledge of use of DME or AE;Decreased knowledge of precautions;Cardiopulmonary status limiting activity;Pain      OT Treatment/Interventions: Self-care/ADL training;Energy conservation;DME and/or AE instruction;Therapeutic activities;Patient/family education;Balance training    OT Goals(Current goals can be found in the care plan section) Acute Rehab OT Goals OT Goal Formulation: With patient Time For Goal Achievement: 02/26/17 Potential to Achieve Goals: Good ADL Goals Pt Will Perform Grooming: with min guard assist;sitting Pt Will Perform Upper Body Bathing: with min assist;sitting Pt Will Perform Lower Body Bathing: with min assist;sit to/from stand Pt Will Perform Upper Body Dressing: with min assist;sitting Pt Will Perform Lower Body Dressing: with min assist;sit to/from stand Pt Will Transfer to Toilet: with min  assist;ambulating Pt Will Perform Toileting - Clothing Manipulation and hygiene: with min assist;sit to/from stand Additional ADL Goal #1: Pt will complete bed mobility at min A level to prepare for OOB ADLs.  OT Frequency: Min 2X/week   Barriers to D/C:            Co-evaluation PT/OT/SLP Co-Evaluation/Treatment: Yes Reason for Co-Treatment: To address functional/ADL transfers;Complexity of the patient's impairments (multi-system involvement)   OT goals addressed during session: ADL's and self-care      AM-PAC PT "6 Clicks" Daily Activity     Outcome Measure Help from another person eating meals?: A Little Help from another person taking care of personal grooming?: A Lot Help from another person toileting, which includes using toliet, bedpan, or urinal?: A Lot Help from another person bathing (including washing, rinsing, drying)?: A Lot Help from another person to put on and taking off regular upper body clothing?: A Lot Help from another person to put on and taking off regular lower body clothing?: A Lot 6 Click Score: 13   End of Session Equipment Utilized During Treatment: Gait belt;Oxygen Nurse Communication: Other (comment)(unclear current WB status of RUE, treated as NWB)  Activity Tolerance: Patient limited by fatigue;Patient tolerated treatment well Patient left: in chair;with call bell/phone within  reach;with chair alarm set;with family/visitor present  OT Visit Diagnosis: Unsteadiness on feet (R26.81);Muscle weakness (generalized) (M62.81);Pain                Time: 1523-1550 OT Time Calculation (min): 27 min Charges:  OT General Charges $OT Visit: 1 Visit OT Evaluation $OT Eval Low Complexity: 1 Low G-Codes:       Pilar Grammes 02/12/2017, 4:11 PM

## 2017-02-12 NOTE — Evaluation (Signed)
Physical Therapy Evaluation Patient Details Name: Leon Freeman MRN: [id number] DOB: 02-23-1932 Today's Date: 02/12/2017   History of Present Illness  83 y.o. male  with past medical history of coronary artery disease (CABG 2009, bioprosthetic AVR), history of right lower extremity DVT 06/2015, history of GI bleed 06/2015, hyperlipidemia admitted on Mar 10, 2017 with chest pain.  Patient has had a recent admission to [facility] where he went in for chest pain.  Patient is now admitted for acute systolic heart failure with a drop in his ejection fraction since September.  Echocardiogram now shows market deterioration in LV function with an EF of 25-30%.  Clinical Impression   Pt admitted with above diagnosis. Pt currently with functional limitations due to the deficits listed below (see PT Problem List). Presents with limited activity tolerance, now dependent on supplemental O2 to keep O2 sats at acceptable levels with activity;  Pt will benefit from skilled PT to increase their independence and safety with mobility to allow discharge to the venue listed below.    Initiated session on Room Air, and pt's O2 sats decr to 87%; restarted supplemental O2 at 3 liters per minute, and O2 sats incr to back above 94%.     Follow Up Recommendations SNF    Equipment Recommendations  Rolling walker with 5" wheels;3in1 (PT)    Recommendations for Other Services       Precautions / Restrictions Precautions Precautions: Fall Restrictions Weight Bearing Restrictions: Yes Other Position/Activity Restrictions: R forearm to hand brace. Pt reports fx in wrist about a month ago. Unclear what current RUE WB status is.       Mobility  Bed Mobility Overal bed mobility: Needs Assistance Bed Mobility: Supine to Sit     Supine to sit: Mod assist;HOB elevated     General bed mobility comments: assist to powerup trunk to EOB. extra time and effort.   Transfers Overall transfer level: Needs  assistance Equipment used: 2 person hand held assist;1 person hand held assist Transfers: Sit to/from [employer] Sit to Stand: Max assist;Mod assist Stand pivot transfers: Mod assist       General transfer comment: max>mod A with sit>stand from EOB. Wide BOS and a bit of a posterior lean at first. Mod to pivot. Extra time and effort.   Ambulation/Gait                Stairs            Wheelchair Mobility    Modified Rankin (Stroke Patients Only)       Balance Overall balance assessment: Needs assistance Sitting-balance support: Single extremity supported;Feet supported Sitting balance-Leahy Scale: Fair       Standing balance-Leahy Scale: Poor Standing balance comment: external support for balance                             Pertinent Vitals/Pain Pain Assessment: Faces Pain Score: 0-No pain Faces Pain Scale: No hurt Pain Intervention(s): Monitored during session    Home Living Family/patient expects to be discharged to:: Skilled nursing facility Living Arrangements: Spouse/significant other   Type of Home: House Home Access: Stairs to enter       Home Equipment: None      Prior Function Level of Independence: Needs assistance   Gait / Transfers Assistance Needed: Pt tells [place] he did not need an assistive device  ADL's / Homemaking Assistance Needed: endorses needing some assistance with bathing/dressing at baseline.  Hand Dominance   Dominant Hand: Left    Extremity/Trunk Assessment   Upper Extremity Assessment Upper Extremity Assessment: Defer to OT evaluation RUE Deficits / Details: pt reports fx in wrist from about a month ago. forearm-hand brace in place.    Lower Extremity Assessment Lower Extremity Assessment: Generalized weakness       Communication   Communication: HOH  Cognition Arousal/Alertness: Awake/alert Behavior During Therapy: WFL for tasks assessed/performed Overall Cognitive  Status: History of cognitive impairments - at baseline                                        General Comments General comments (skin integrity, edema, etc.): Son present during session.     Exercises     Assessment/Plan    PT Assessment Patient needs continued PT services  PT Problem List Decreased strength;Decreased activity tolerance;Decreased balance;Decreased mobility;Decreased cognition;Decreased knowledge of use of DME;Cardiopulmonary status limiting activity       PT Treatment Interventions DME instruction;Gait training;Functional mobility training;Therapeutic activities;Therapeutic exercise;Balance training;Neuromuscular re-education;Cognitive remediation;Patient/family education    PT Goals (Current goals can be found in the Care Plan section)  Acute Rehab PT Goals Patient Stated Goal: did not state, but agreeable to getting up to chair PT Goal Formulation: With patient Time For Goal Achievement: 02/26/17 Potential to Achieve Goals: Good    Frequency Min 2X/week   Barriers to discharge        Co-evaluation PT/OT/SLP Co-Evaluation/Treatment: Yes Reason for Co-Treatment: For patient/therapist safety;Other (comment)(anticipated decr activity tolerance) PT goals addressed during session: Mobility/safety with mobility OT goals addressed during session: ADL's and self-care       AM-PAC PT "6 Clicks" Daily Activity  Outcome Measure Difficulty turning over in bed (including adjusting bedclothes, sheets and blankets)?: A Little Difficulty moving from lying on back to sitting on the side of the bed? : Unable Difficulty sitting down on and standing up from a chair with arms (e.g., wheelchair, bedside commode, etc,.)?: Unable Help needed moving to and from a bed to chair (including a wheelchair)?: A Lot Help needed walking in hospital room?: A Lot Help needed climbing 3-5 steps with a railing? : A Lot 6 Click Score: 11    End of Session Equipment  Utilized During Treatment: Gait belt;Oxygen Activity Tolerance: Patient limited by fatigue Patient left: in chair;with call bell/phone within reach Nurse Communication: Mobility status PT Visit Diagnosis: Unsteadiness on feet (R26.81);Other abnormalities of gait and mobility (R26.89)    Time: 1610-9604 PT Time Calculation (min) (ACUTE ONLY): 20 min   Charges:   PT Evaluation $PT Eval Moderate Complexity: 1 Mod     PT G Codes:        Van Clines, PT  Acute Rehabilitation Services Pager [phone] Office [phone]   Levi Aland 02/12/2017, 5:33 PM

## 2017-02-13 NOTE — Progress Notes (Signed)
Pt resting in bed quietly. Easily aroused. Denies any pain at this time. Family at bedside and daughter states that "his pain is not being adequately controlled with just the morphine. Can he have an NSAID also for his arthritis?" Another family member requests to have information regarding pt / ot and was informed by this nurse that, that was not a good time for me to review this pt's notes. They requested charge nurse who was able to speak with them. Morphine was administered per request as pt tells family that his "legs and feet are hurting" but denies to this nurse stating "I'm ok." pt / family educated that morphine was given a little earlier which will make the next dose a little later to even out. T/C placed to on-call provider to relay family concern of pain management and states that NSAIDS are currently contraindicated for pt. Family left bedside prior to obtaining this information. Pt with black brace noted to right forearm and elevated on pillow. Without any noted s/s of pain via grimace, groan or moan noted. callbell within reach. Safety maintained. Will continue to monitor.

## 2017-02-13 NOTE — Progress Notes (Addendum)
Progress Note  Patient Name: Leon Freeman Date of Encounter: 02/13/2017  Primary Cardiologist: Dr Purvis Sheffield and Dr Clifton James  Subjective   Patient has had chest pain or not had chest pain earlier today or not earlier today.  He currently denies shortness of breath.  He is unaware of where he is.  Inpatient Medications    Scheduled Meds: . aspirin EC  81 mg Oral Daily  . donepezil  10 mg Oral QHS  . furosemide  40 mg Intravenous BID  . heparin subcutaneous  5,000 Units Subcutaneous Q8H  . mouth rinse  15 mL Mouth Rinse BID  . pantoprazole  40 mg Oral Daily  . sodium chloride flush  3 mL Intravenous Q12H  . sulfaSALAzine  500 mg Oral TID   Continuous Infusions: . sodium chloride     PRN Meds: sodium chloride, acetaminophen, morphine injection, nitroGLYCERIN, ondansetron (ZOFRAN) IV, sodium chloride flush   Vital Signs    Vitals:   02/12/17 0437 02/12/17 1445 02/12/17 2055 02/13/17 0539  BP: (!) 94/57 100/64 103/63 (!) 101/59  Pulse: 65 (!) 107 (!) 108 (!) 109  Resp: 18 18 18 20   Temp: 97.9 F (36.6 C) 98.3 F (36.8 C) 98.5 F (36.9 C) 97.9 F (36.6 C)  TempSrc: Oral Oral Oral Oral  SpO2: 96% 94% 93% 95%  Weight:      Height:        Intake/Output Summary (Last 24 hours) at 02/13/2017 1414 Last data filed at 02/13/2017 0945 Gross per 24 hour  Intake 683 ml  Output 550 ml  Net 133 ml   Filed Weights   02/10/17 0200 02/11/17 0002 02/11/17 1950  Weight: 178 lb 9.2 oz (81 kg) 182 lb 12.2 oz (82.9 kg) 180 lb 16 oz (82.1 kg)    Telemetry    Sinus tach with PVCs and couplet- Personally Reviewed   Physical Exam   Well developed and nourished in mod resp distress wearing O2 HENT normal Neck supple with JVP> 10 Carotids delayed Clear laterally Regular rate and rhythm, no murmurs or gallops Abd-soft with active BS without hepatomegaly No Clubbing cyanosis tr edema Skin-coolish and dry A & Oriented x1  grossly normal sensory and motor  function    Labs    Chemistry Recent Labs  Lab 02-17-17 1625 02/11/17 0428 02/12/17 0543  NA 137 142 145  K 4.6 4.6 4.5  CL 101 109 111  CO2 20* 18* 20*  GLUCOSE 136* 109* 116*  BUN 59* 77* 86*  CREATININE 2.08* 2.57* 2.72*  CALCIUM 9.1 8.8* 8.8*  GFRNONAA 28* 22* 20*  GFRAA 32* 25* 23*  ANIONGAP 16* 15 14     Hematology Recent Labs  Lab Feb 17, 2017 1752 02/11/17 0428  WBC 8.4 10.9*  RBC 3.19* 3.25*  HGB 8.6* 8.2*  HCT 27.5* 27.6*  MCV 86.2 84.9  MCH 27.0 25.2*  MCHC 31.3 29.7*  RDW 15.2 15.6*  PLT 298 267    Cardiac Enzymes Recent Labs  Lab 17-Feb-2017 1625  TROPONINI 0.47*    BNP Recent Labs  Lab 02-17-2017 1752  BNP 2,090.0*      Radiology    No results found.  Patient Profile     83 y.o. male with past medical history of coronary artery disease status post coronary artery bypassing graft, prior aortic valve replacement in 2009 with prosthetic valve aortic stenosis followed in TAVR clinic, mild dementia, and prior DVT and prior GI bleed from AVMs admitted with chest pain, mildly elevated troponin, acute  systolic congestive heart failure and acute renal failure. Last echocardiogram in [facility] 12/29/2016 showed normal LV function, prosthetic aortic valve with severe stenosis with mean gradient 40 mmHg (possible contribution from patient/prosthesis mismatch) and mild aortic insufficiency mild mitral regurgitation. Recently hospitalized in [place] for chest pain and by report echocardiogram now showed ejection fraction 25-30%. Troponin was elevated at 0.8.  Assessment & Plan    Acute systolic congestive heart failure Severe aortic stenosis-  Acute left ventricular dysfunction EF was normal 9/18. Acute renal failure Cardiogenic shock-cool Anemia Disoriented Troponin elevation   His volume status is better but he is still volume overloaded.  Diuresis has resulted in a cardiorenal syndrome.  Discussions on Friday specifically delineated a desire to  avoid inotropic therapy.  Hence, apart from ongoing diuresis for control of dyspnea as is being done.  Troponin elevation is minimal and does not explain the acute change in LV function; Apparently he was in [facility]; those records are not available  His course will continue to deteriorate.  No family was available.   Signed, Sherryl Manges, MD  02/13/2017, 2:14 PM

## 2017-02-13 NOTE — Progress Notes (Signed)
PROGRESS NOTE    Leon Freeman  RUE:[id number] DOB: Aug 01, 1931 DOA: Mar 08, 2017 PCP: Renaldo Harrison, DO   Specialists:     Brief Narrative:   83 year old male CABG 12/06/2007, bioprosthetic AVR at the time as well Nonhealing ulcer left lower extremity shin of tibia Right lower DVT 06/2015, R CEA  GI bleed 06/2015 duodenal ulcer Hyperlipidemia  Recent admission to [facility] hospital--Went there for chest pain but did not have ischemic workup with Troponin was slightly elevated given 40 mg of Lasix and cardiology was consulted   Assessment & Plan:   Principal Problem:   Chest pain Active Problems:   S/P AVR (aortic valve replacement) porcine 2009   Hypertension   Anemia   Acute on chronic combined systolic and diastolic CHF (congestive heart failure) (HCC)   Hyperglycemia   ACS (acute coronary syndrome) (HCC)   Heart failure (HCC)   Palliative care by specialist   Status post CABG 2009+ bioprosthetic valve repair Angina  acute systolic failure >CAD but because of his creatinine cannot undergo cath  Beta-blocker was held because of acute heart failure  morphine 1-3 mg every 3 as needed for chest pain + nitroglycerin which would be  first choice sublingual 0.4  Continue aspirin 81 daily  Now full comfort care-- require SNf + pallaitive on d/c--[facility] to follow-family have already contacted a Facility  D/c monitors   Acute systolic CHF with drop EF+ new WM anomalies Lad Lcx Prior severe aortic stenosis non TAVR candidate family/pt not wishing aggressive measures  Echocardiogram shows drop EF/WM anomaly  diuresing with Lasix 40 IV twice daily  Home dose was 30 mg daily Lasix-change to  po liquid lasix on d/c  No further labs  AKI  Large bump in creatinine--goal now more comfort so continue to diurese regardless   hyperglycemia  Not known to have any type of blood sugar issues in the past  Follow HbA1c  Hyperlipidemia  D/c Zetia 10 mg at bedtime, Zocor 40  daily--not compatible with comfort  Arthritis  Seems like it was felt that patient had some type of seronegative arthritis and would continue sulfasalazine 500 3 times daily in addition to glucosamine capsules  Dementia  Continue Aricept 10 qhs    D/w son and family bedside  pallaitve unit  therapy recs SNF  SW input pending--probably can d/c to [facility] 11/12   Consultants:   cardiology  Procedures:   Echocardiogram drop in EF  Antimicrobials:   nad    Subjective:  Sleepy today no distress family concerned about appetite and him not eating overall no sig changes  Objective: Vitals:   02/12/17 1445 02/12/17 2055 02/13/17 0539 02/13/17 1500  BP: 100/64 103/63 (!) 101/59 103/60  Pulse: (!) 107 (!) 108 (!) 109 (!) 108  Resp: 18 18 20 18   Temp: 98.3 F (36.8 C) 98.5 F (36.9 C) 97.9 F (36.6 C) 98.1 F (36.7 C)  TempSrc: Oral Oral Oral Oral  SpO2: 94% 93% 95% 95%  Weight:      Height:        Intake/Output Summary (Last 24 hours) at 02/13/2017 1621 Last data filed at 02/13/2017 1533 Gross per 24 hour  Intake 443 ml  Output 750 ml  Net -307 ml   Filed Weights   02/10/17 0200 02/11/17 0002 02/11/17 1950  Weight: 81 kg (178 lb 9.2 oz) 82.9 kg (182 lb 12.2 oz) 82.1 kg (180 lb 16 oz)    Examination:   arcus senilis Cannot assess jvd cta  b No rales in axilla  Data Reviewed: I have personally reviewed following labs and imaging studies  CBC: Recent Labs  Lab 02/22/2017 1752 02/11/17 0428  WBC 8.4 10.9*  NEUTROABS 6.3  --   HGB 8.6* 8.2*  HCT 27.5* 27.6*  MCV 86.2 84.9  PLT 298 267   Basic Metabolic Panel: Recent Labs  Lab 02-22-2017 1625 02/11/17 0428 02/12/17 0543  NA 137 142 145  K 4.6 4.6 4.5  CL 101 109 111  CO2 20* 18* 20*  GLUCOSE 136* 109* 116*  BUN 59* 77* 86*  CREATININE 2.08* 2.57* 2.72*  CALCIUM 9.1 8.8* 8.8*   GFR: Estimated Creatinine Clearance: 21.1 mL/min (A) (by C-G formula based on SCr of 2.72 mg/dL (H)). Liver  Function Tests: No results for input(s): AST, ALT, ALKPHOS, BILITOT, PROT, ALBUMIN in the last 168 hours. No results for input(s): LIPASE, AMYLASE in the last 168 hours. No results for input(s): AMMONIA in the last 168 hours. Coagulation Profile: Recent Labs  Lab 02/22/2017 1752  INR 1.32   Cardiac Enzymes: Recent Labs  Lab February 22, 2017 1625  TROPONINI 0.47*   BNP (last 3 results) No results for input(s): PROBNP in the last 8760 hours. HbA1C: No results for input(s): HGBA1C in the last 72 hours. CBG: Recent Labs  Lab 02/11/17 1224  GLUCAP 133*   Lipid Profile: No results for input(s): CHOL, HDL, LDLCALC, TRIG, CHOLHDL, LDLDIRECT in the last 72 hours. Thyroid Function Tests: No results for input(s): TSH, T4TOTAL, FREET4, T3FREE, THYROIDAB in the last 72 hours. Anemia Panel: No results for input(s): VITAMINB12, FOLATE, FERRITIN, TIBC, IRON, RETICCTPCT in the last 72 hours. Urine analysis:    Component Value Date/Time   COLORURINE YELLOW 07/01/2015 0640   APPEARANCEUR CLEAR 07/01/2015 0640   LABSPEC 1.020 07/01/2015 0640   PHURINE 5.0 07/01/2015 0640   GLUCOSEU NEGATIVE 07/01/2015 0640   HGBUR NEGATIVE 07/01/2015 0640   BILIRUBINUR NEGATIVE 07/01/2015 0640   KETONESUR NEGATIVE 07/01/2015 0640   PROTEINUR NEGATIVE 07/01/2015 0640   NITRITE NEGATIVE 07/01/2015 0640   LEUKOCYTESUR NEGATIVE 07/01/2015 0640     Radiology Studies: Reviewed images personally in health database    Scheduled Meds: . aspirin EC  81 mg Oral Daily  . donepezil  10 mg Oral QHS  . furosemide  40 mg Intravenous BID  . heparin subcutaneous  5,000 Units Subcutaneous Q8H  . mouth rinse  15 mL Mouth Rinse BID  . pantoprazole  40 mg Oral Daily  . sodium chloride flush  3 mL Intravenous Q12H  . sulfaSALAzine  500 mg Oral TID   Continuous Infusions: . sodium chloride       LOS: 4 days    Time spent: 54    Pleas Koch, MD [facility] ([phone]   If 7PM-7AM, please contact  night-coverage [email or web address] Password [email or web address] 02/13/2017, 4:21 PM

## 2017-02-14 DIAGNOSIS — I259 Chronic ischemic heart disease, unspecified: Secondary | ICD-10-CM

## 2017-02-15 MED FILL — Fentanyl Citrate Preservative Free (PF) Inj 100 MCG/2ML: INTRAMUSCULAR | Qty: 2 | Status: AC

## 2017-03-05 NOTE — Progress Notes (Signed)
T/c placed to pt's spouse to inform of change in status with condolences given. T/C placed to dtr Harris to inform of change in status as well with condolences. Time noted of pt's change in status  Is 0130.

## 2017-03-05 NOTE — Progress Notes (Signed)
T/c received from Dtr Tiburcio Pea and requests [facility] in [place]. Phone number [phone] which was called and spoke with richard to provide patient information for transfer into their care. Phone line was disconnected and with a return phone call, this nurse spoke with christine who will verify which location family wants. Address to this phone number is [address] [place] [address]. Pt bath completed and oxygenation removed and clean gown donned. Two partial teeth plates are placed within his mouth. One pair of eye glasses at bedside awaitng to transfer with pt. Ms. Tiburcio Pea aware of these belongings. Will continue to monitor.

## 2017-03-05 NOTE — Progress Notes (Signed)
Upon rounding, pt is noted to be breathless and pulseless with the right leg hanging out of the bed. Right leg is moved back into bed and pt covers are adjusted. Pt's o2 is adjusted andpt's name is called. Lights turned on and reassessment is completed with previously documented findings. Charge nurse notified and t/c placed to on call provider to inform.

## 2017-03-05 NOTE — Discharge Summary (Signed)
Death Summary  Leon Freeman UXN:[id number] DOB: 1932/01/06 DOA: 2017/02/13  PCP: Renaldo Harrison, DO  Admit date: 2017-02-13 Date of Death: 18-Feb-2017 Time of Death: 01:38 am Notification: Renaldo Harrison, DO notified of death of 02-18-2017   History of present illness:  Leon Freeman is a 83 y.o. male with a history of   83 year old male CABG 12/06/2007, bioprosthetic AVR at the time as well Nonhealing ulcer left lower extremity shin of tibia Right lower DVT 06/2015, R CEA  GI bleed 06/2015 duodenal ulcer Hyperlipidemia  Recent admission to [facility] hospital--Went there for chest pain but did not have ischemic workup with Troponin was slightly elevated given 40 mg of Lasix and cardiology was consulted  Kara Mead did not improve after interventions and noted to have a rising creatinine and was diuresed but cardiology did not feel he was a good candidate for cardiac catheterization and multiple comorbidities as well as bioprosthetic valve degeneration Palliative care was consulted and patient was made comfort care he passed as above  Final Diagnoses:  1.   Acute hypoxic respiratory failure likely secondary to acute systolic heart failure   The results of significant diagnostics from this hospitalization (including imaging, microbiology, ancillary and laboratory) are listed below for reference.    Significant Diagnostic Studies: Dg Chest 2 View  Result Date: Feb 13, 2017 CLINICAL DATA:  Initial evaluation for acute left-sided chest pain. EXAM: CHEST  2 VIEW COMPARISON:  A prior radiograph from 07/06/2015. FINDINGS: Median sternotomy wires underlying surgical clips and CABG markers. Prostatic valve. Moderate cardiomegaly, stable. Mediastinal silhouette within normal limits. Aortic atherosclerosis. Lungs normally inflated. Diffuse vascular and interstitial congestion. Small layering bilateral pleural effusions, right slightly larger than left. Right basilar opacity favored to reflect  atelectasis, although infiltrates not excluded. No other focal airspace disease. No pneumothorax. No acute osseus abnormality. IMPRESSION: 1. Cardiomegaly with mild diffuse pulmonary interstitial congestion without frank pulmonary edema. 2. Trace bilateral pleural effusions, right side larger than left. 3. Associated patchy right basilar opacity. Atelectasis is favored, although infiltrate could be considered in the correct clinical setting. Electronically Signed   By: Rise Mu M.D.   On: 02/13/17 17:07    Microbiology: Recent Results (from the past 240 hour(s))  MRSA PCR Screening     Status: None   Collection Time: 02/10/17  8:33 AM  Result Value Ref Range Status   MRSA by PCR NEGATIVE NEGATIVE Final    Comment:        The GeneXpert MRSA Assay (FDA approved for NASAL specimens only), is one component of a comprehensive MRSA colonization surveillance program. It is not intended to diagnose MRSA infection nor to guide or monitor treatment for MRSA infections.      Labs: Basic Metabolic Panel: Recent Labs  Lab 13-Feb-2017 1625 02/11/17 0428 02/12/17 0543  NA 137 142 145  K 4.6 4.6 4.5  CL 101 109 111  CO2 20* 18* 20*  GLUCOSE 136* 109* 116*  BUN 59* 77* 86*  CREATININE 2.08* 2.57* 2.72*  CALCIUM 9.1 8.8* 8.8*   Liver Function Tests: No results for input(s): AST, ALT, ALKPHOS, BILITOT, PROT, ALBUMIN in the last 168 hours. No results for input(s): LIPASE, AMYLASE in the last 168 hours. No results for input(s): AMMONIA in the last 168 hours. CBC: Recent Labs  Lab 02-13-17 1752 02/11/17 0428  WBC 8.4 10.9*  NEUTROABS 6.3  --   HGB 8.6* 8.2*  HCT 27.5* 27.6*  MCV 86.2 84.9  PLT 298 267   Cardiac Enzymes: Recent Labs  Lab 03/01/17 1625  TROPONINI 0.47*   D-Dimer No results for input(s): DDIMER in the last 72 hours. BNP: Invalid input(s): POCBNP CBG: Recent Labs  Lab 02/11/17 1224  GLUCAP 133*   Anemia work up No results for input(s):  VITAMINB12, FOLATE, FERRITIN, TIBC, IRON, RETICCTPCT in the last 72 hours. Urinalysis    Component Value Date/Time   COLORURINE YELLOW 07/01/2015 0640   APPEARANCEUR CLEAR 07/01/2015 0640   LABSPEC 1.020 07/01/2015 0640   PHURINE 5.0 07/01/2015 0640   GLUCOSEU NEGATIVE 07/01/2015 0640   HGBUR NEGATIVE 07/01/2015 0640   BILIRUBINUR NEGATIVE 07/01/2015 0640   KETONESUR NEGATIVE 07/01/2015 0640   PROTEINUR NEGATIVE 07/01/2015 0640   NITRITE NEGATIVE 07/01/2015 0640   LEUKOCYTESUR NEGATIVE 07/01/2015 0640   Sepsis Labs Invalid input(s): PROCALCITONIN,  WBC,  LACTICIDVEN     SIGNED:  Rhetta Mura, MD  [facility] 02/06/2017, 11:08 AM Pager   If 7PM-7AM, please contact night-coverage [email or web address] Password [email or web address]

## 2017-03-05 NOTE — Progress Notes (Signed)
Donor services called and spoke with paige at [phone] and reference number given as [id number]. [facility] home was notified by charge nurse to hold pt due to potential donor.

## 2017-03-05 DEATH — deceased

## 2017-09-27 IMAGING — DX DG ABDOMEN 1V
1 series · 1 of 1 positions shown · non-contrast
Comparison: None.

CLINICAL DATA: Evaluate for possible foreign body in the GI tract

EXAM:
ABDOMEN - 1 VIEW

[abdomen kub]
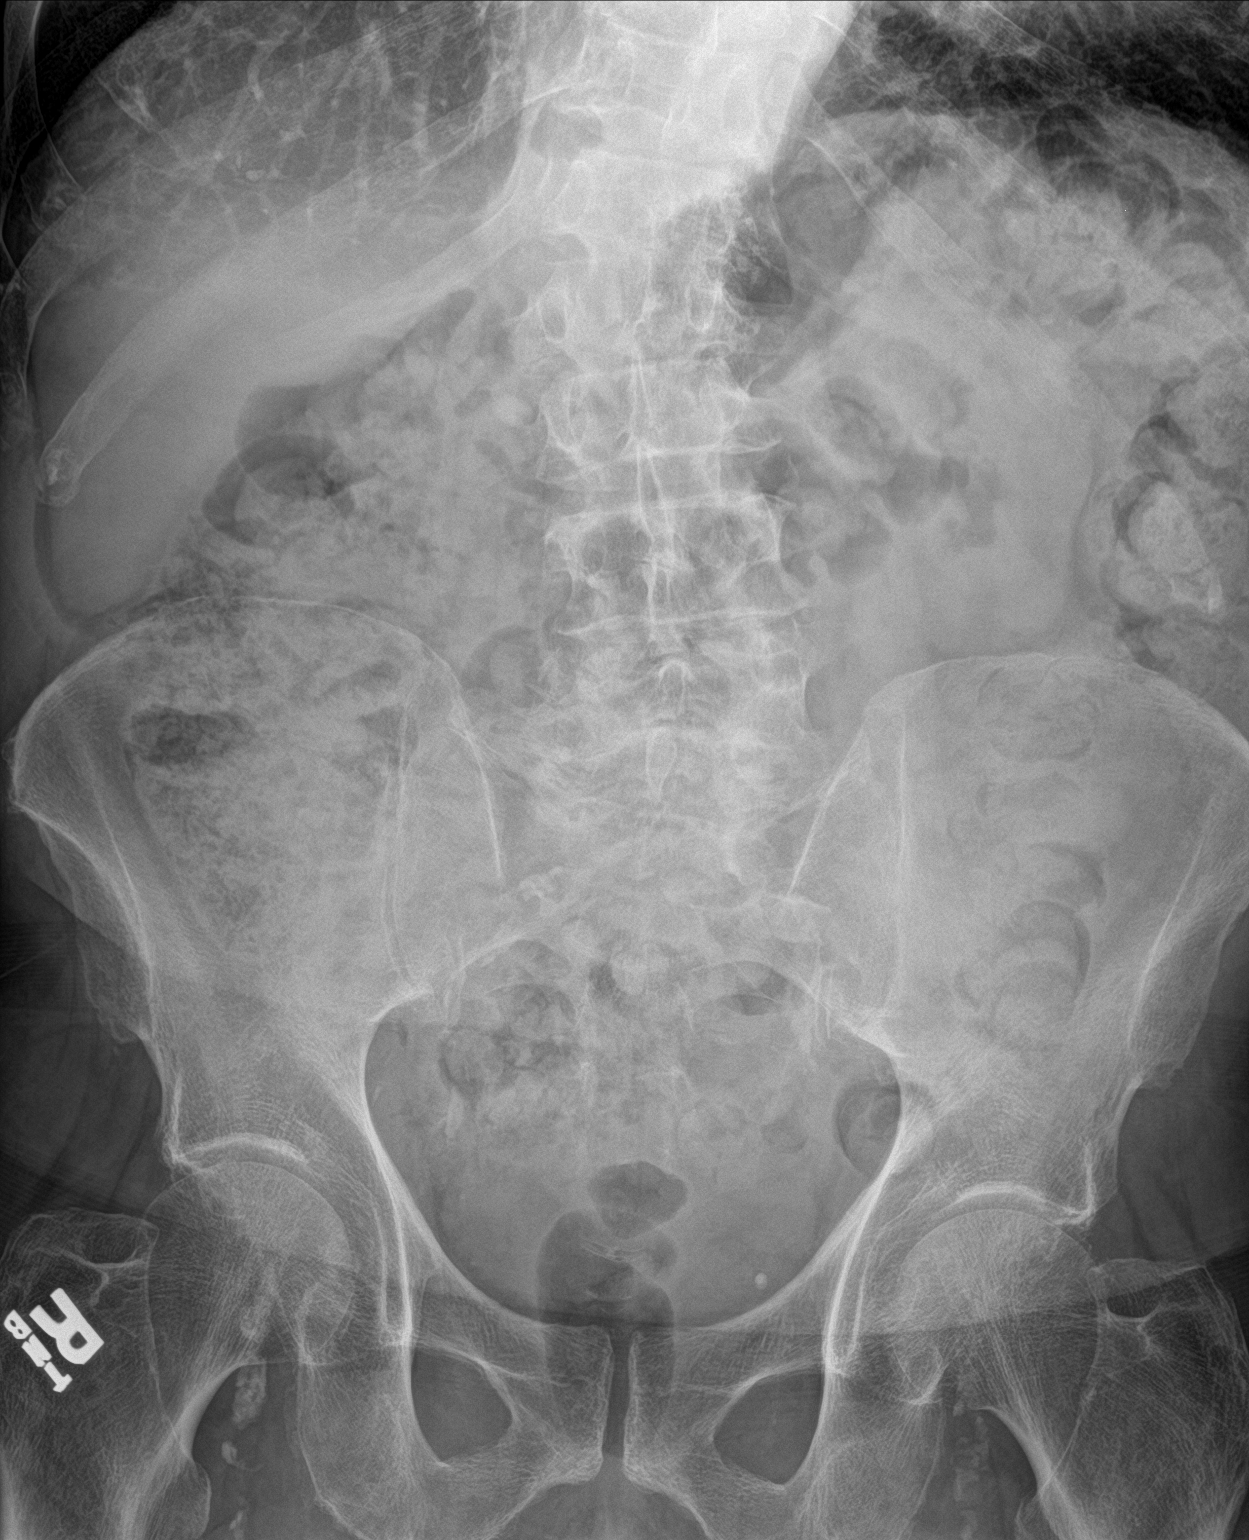

[1 of 1 positions shown; findings below may reference images not displayed]

FINDINGS: A supine view of the abdomen shows a moderately large amount of
feces throughout the entire colon which may indicate constipation
clinically. No opaque foreign body is seen. No bowel obstruction is
noted. If free air is a concern clinically, recommend erect view of
the abdomen. No opaque calculi are noted. There is a lumbar
scoliosis convex to the right with degenerative change in the lumbar
spine.
IMPRESSION: 1. Moderately large amount of feces throughout the entire colon.
Evaluate for constipation. No opaque foreign body is seen.
2. No opaque calculi are noted.
3. Consider upright view if free air is a clinical concern.

## 2018-09-24 IMAGING — US US EXTREM LOW VENOUS*R*
1 series · 14 of 24 positions shown · non-contrast
Comparison: None.

CLINICAL DATA: Follow-up DVT

EXAM:
RIGHT LOWER EXTREMITY VENOUS DUPLEX ULTRASOUND
TECHNIQUE: Doppler venous assessment of the left lower extremity deep venous
system was performed, including characterization of spectral flow,
compressibility, and phasicity.

[Series 1: us extrem low venous*right* · 0.07mm/px · 14 of 42 slices shown]
[im 1/42]
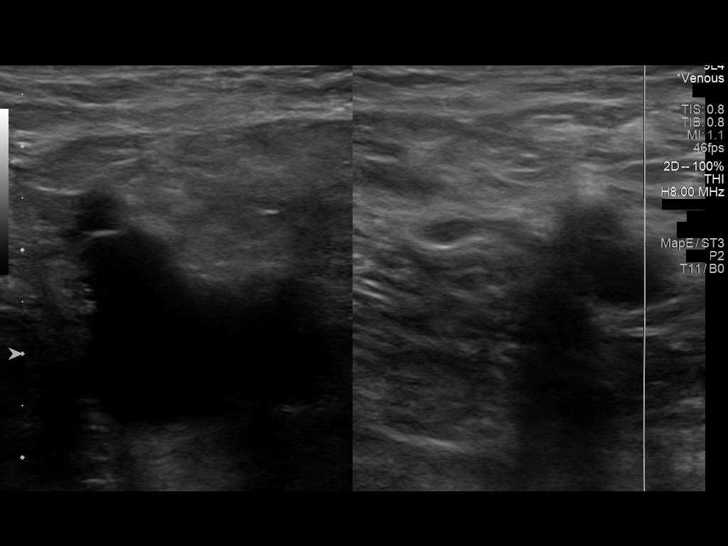
[im 4/42]
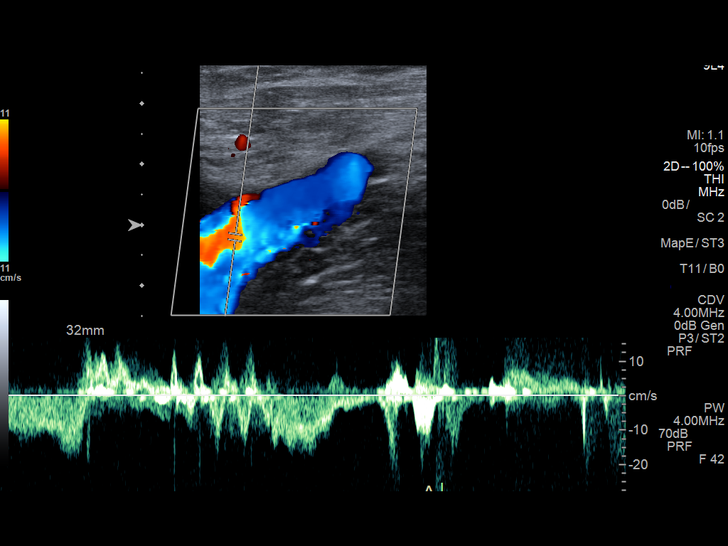
[im 8/42]
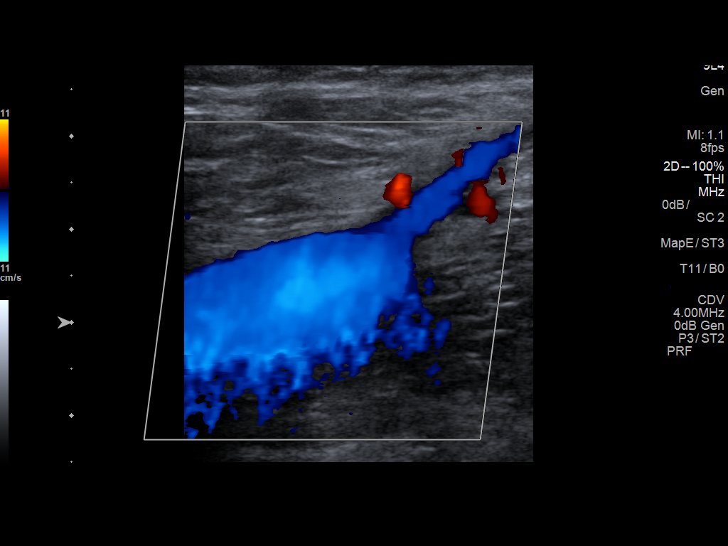
[im 11/42]
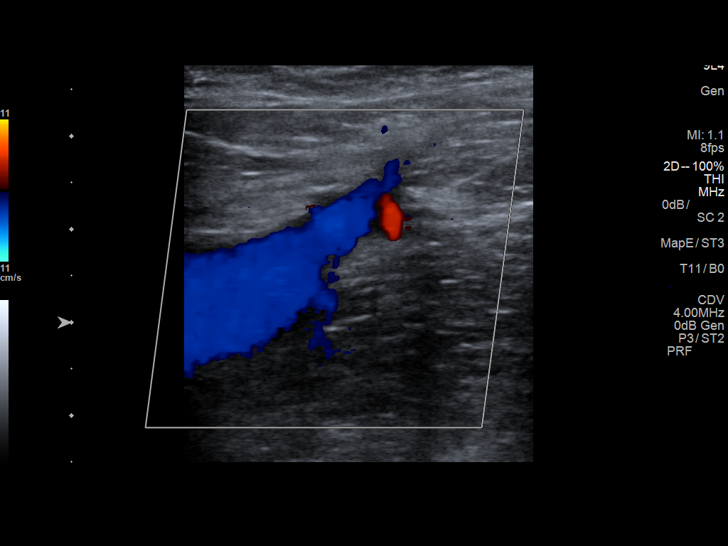
[im 13/42]
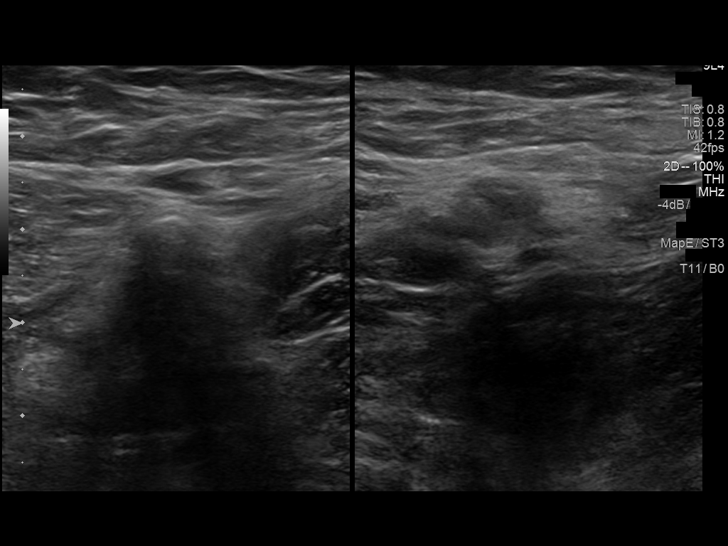
[im 17/42]
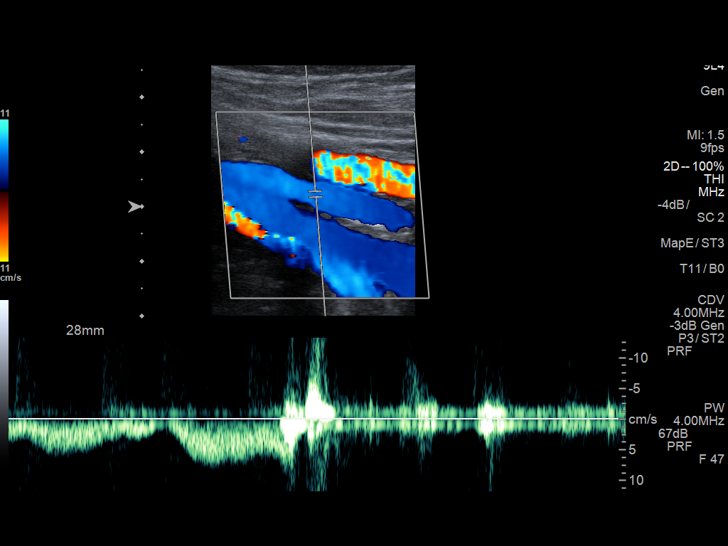
[im 20/42]
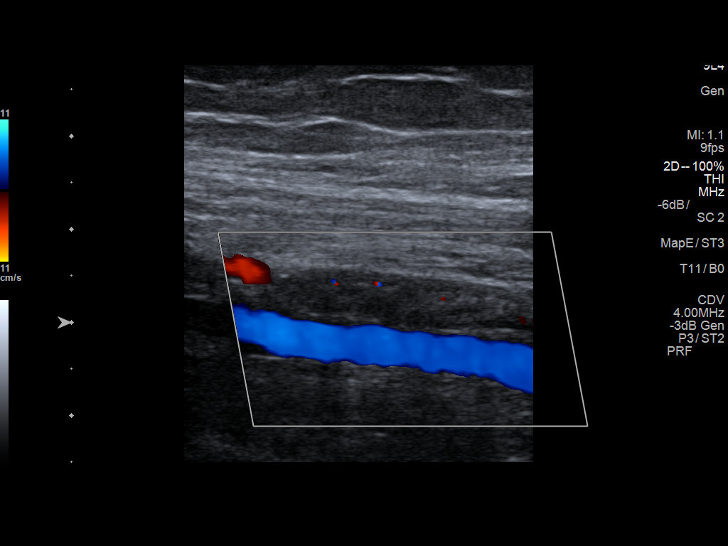
[im 22/42]
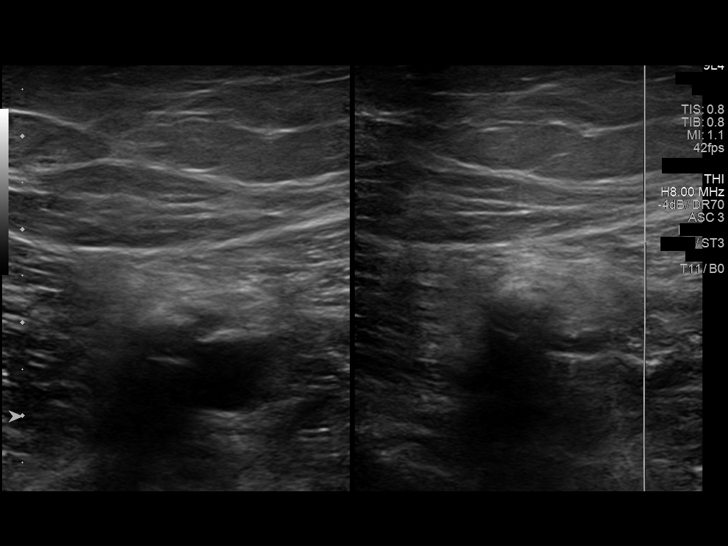
[im 25/42]
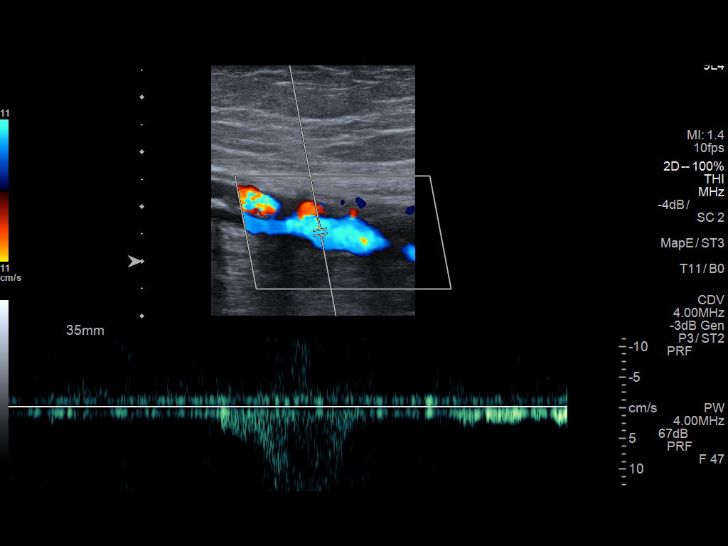
[im 29/42]
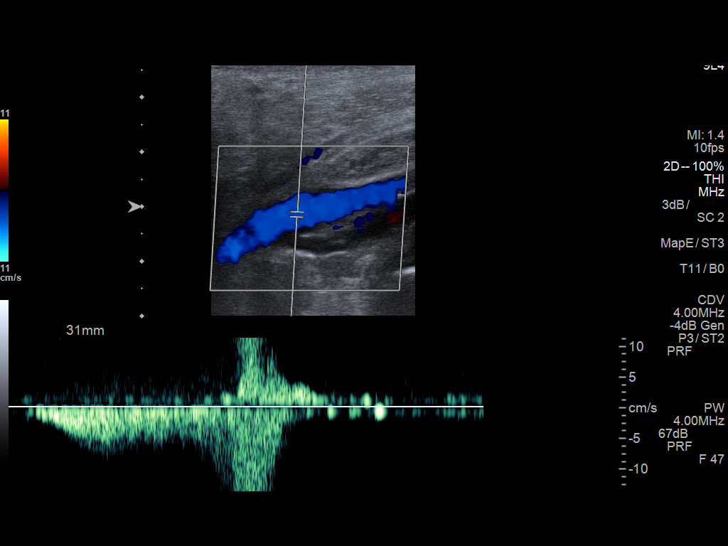
[im 33/42]
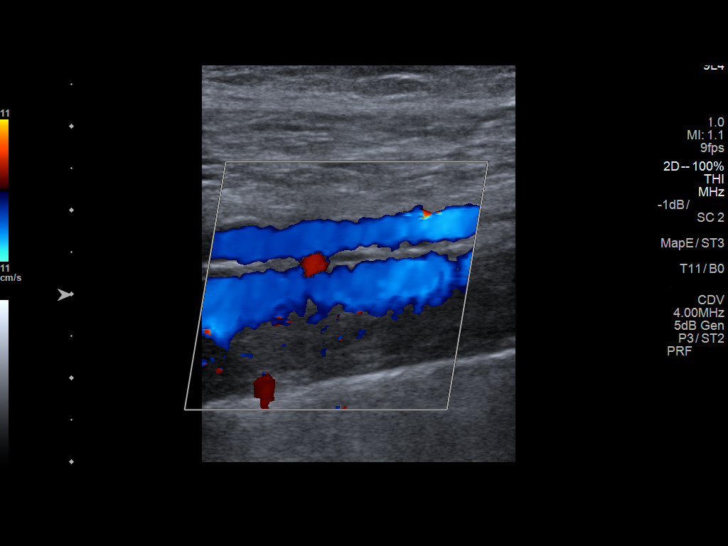
[im 34/42]
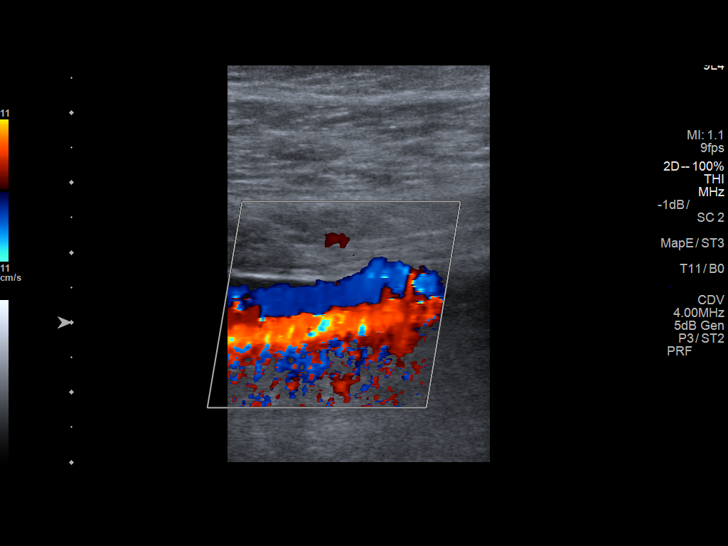
[im 38/42]
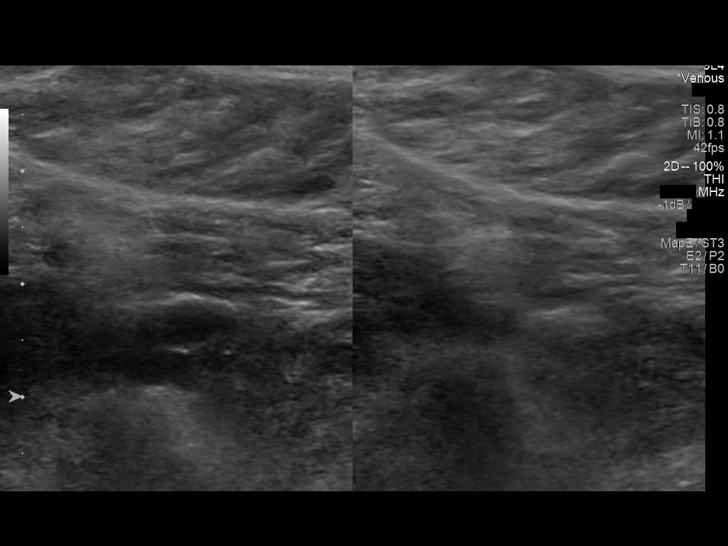
[im 42/42]
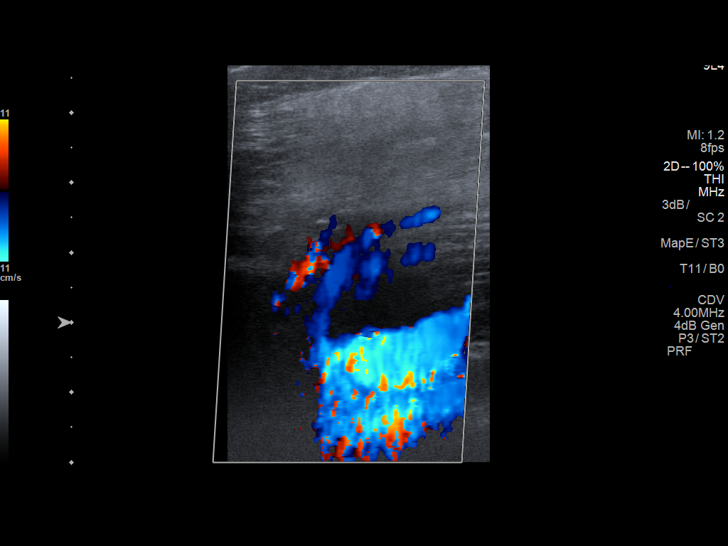

[14 of 24 positions shown; findings below may reference images not displayed]

FINDINGS: There is complete compressibility of the right common femoral,
femoral, and popliteal veins. Doppler analysis demonstrates
respiratory phasicity and augmentation of flow with calf
compression. No obvious superficial vein thrombosis.

There continues to be partially occlusive thrombus in the right
peroneal vein. The thrombus is adherent to both visualized walls and
there is a central recannulized channel. These findings most likely
reflect chronic DVT and re- cannulization. It is not significantly
changed.
IMPRESSION: No definite acute DVT in the right lower extremity. Partially
occlusive thrombus in the right peroneal vein is stable and likely
chronic in nature.
